# Patient Record
Sex: Female | Born: 1975 | Race: White | Hispanic: No | Marital: Married | Smoking: Never smoker
Health system: Southern US, Community
[De-identification: ages and names within clinical notes are randomized; demographics above are authoritative.]

## PROBLEM LIST (undated history)

## (undated) DIAGNOSIS — Z923 Personal history of irradiation: Secondary | ICD-10-CM

## (undated) DIAGNOSIS — D693 Immune thrombocytopenic purpura: Secondary | ICD-10-CM

## (undated) DIAGNOSIS — D72819 Decreased white blood cell count, unspecified: Secondary | ICD-10-CM

## (undated) DIAGNOSIS — K118 Other diseases of salivary glands: Secondary | ICD-10-CM

## (undated) DIAGNOSIS — D72821 Monocytosis (symptomatic): Secondary | ICD-10-CM

## (undated) DIAGNOSIS — M8700 Idiopathic aseptic necrosis of unspecified bone: Secondary | ICD-10-CM

## (undated) HISTORY — DX: Monocytosis (symptomatic): D72.821

## (undated) HISTORY — DX: Other diseases of salivary glands: K11.8

## (undated) HISTORY — DX: Idiopathic aseptic necrosis of unspecified bone: M87.00

## (undated) HISTORY — PX: TUBAL LIGATION: SHX77

## (undated) HISTORY — PX: OTHER SURGICAL HISTORY: SHX169

## (undated) HISTORY — DX: Immune thrombocytopenic purpura: D69.3

## (undated) HISTORY — DX: Decreased white blood cell count, unspecified: D72.819

---
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
## 1999-05-22 ENCOUNTER — Other Ambulatory Visit: Admission: RE | Admit: 1999-05-22 | Discharge: 1999-05-22 | Payer: Self-pay | Admitting: Obstetrics and Gynecology

## 2000-05-02 ENCOUNTER — Other Ambulatory Visit: Admission: RE | Admit: 2000-05-02 | Discharge: 2000-05-02 | Payer: Self-pay | Admitting: Obstetrics and Gynecology

## 2001-05-11 ENCOUNTER — Other Ambulatory Visit: Admission: RE | Admit: 2001-05-11 | Discharge: 2001-05-11 | Payer: Self-pay | Admitting: Obstetrics and Gynecology

## 2002-05-14 ENCOUNTER — Other Ambulatory Visit: Admission: RE | Admit: 2002-05-14 | Discharge: 2002-05-14 | Payer: Self-pay | Admitting: Obstetrics and Gynecology

## 2003-05-20 ENCOUNTER — Other Ambulatory Visit: Admission: RE | Admit: 2003-05-20 | Discharge: 2003-05-20 | Payer: Self-pay | Admitting: Obstetrics and Gynecology

## 2004-05-19 ENCOUNTER — Other Ambulatory Visit: Admission: RE | Admit: 2004-05-19 | Discharge: 2004-05-19 | Payer: Self-pay | Admitting: Obstetrics and Gynecology

## 2005-06-07 ENCOUNTER — Other Ambulatory Visit: Admission: RE | Admit: 2005-06-07 | Discharge: 2005-06-07 | Payer: Self-pay | Admitting: Obstetrics and Gynecology

## 2005-06-21 ENCOUNTER — Encounter: Admission: RE | Admit: 2005-06-21 | Discharge: 2005-09-19 | Payer: Self-pay | Admitting: Obstetrics and Gynecology

## 2006-01-10 ENCOUNTER — Encounter (INDEPENDENT_AMBULATORY_CARE_PROVIDER_SITE_OTHER): Payer: Self-pay | Admitting: *Deleted

## 2006-01-10 ENCOUNTER — Ambulatory Visit (HOSPITAL_COMMUNITY): Admission: RE | Admit: 2006-01-10 | Discharge: 2006-01-10 | Payer: Self-pay | Admitting: Obstetrics and Gynecology

## 2006-04-29 ENCOUNTER — Inpatient Hospital Stay (HOSPITAL_COMMUNITY): Admission: AD | Admit: 2006-04-29 | Discharge: 2006-04-29 | Payer: Self-pay | Admitting: Obstetrics & Gynecology

## 2006-05-02 ENCOUNTER — Ambulatory Visit (HOSPITAL_COMMUNITY): Admission: RE | Admit: 2006-05-02 | Discharge: 2006-05-02 | Payer: Self-pay | Admitting: Obstetrics and Gynecology

## 2006-05-04 ENCOUNTER — Inpatient Hospital Stay (HOSPITAL_COMMUNITY): Admission: AD | Admit: 2006-05-04 | Discharge: 2006-05-04 | Payer: Self-pay | Admitting: Obstetrics and Gynecology

## 2006-05-04 ENCOUNTER — Ambulatory Visit: Payer: Self-pay | Admitting: Oncology

## 2006-05-05 ENCOUNTER — Inpatient Hospital Stay (HOSPITAL_COMMUNITY): Admission: AD | Admit: 2006-05-05 | Discharge: 2006-05-05 | Payer: Self-pay | Admitting: Obstetrics and Gynecology

## 2006-05-05 ENCOUNTER — Ambulatory Visit: Payer: Self-pay | Admitting: Oncology

## 2006-05-06 ENCOUNTER — Inpatient Hospital Stay (HOSPITAL_COMMUNITY): Admission: AD | Admit: 2006-05-06 | Discharge: 2006-05-06 | Payer: Self-pay | Admitting: Obstetrics and Gynecology

## 2006-05-09 ENCOUNTER — Inpatient Hospital Stay (HOSPITAL_COMMUNITY): Admission: AD | Admit: 2006-05-09 | Discharge: 2006-05-09 | Payer: Self-pay | Admitting: Obstetrics and Gynecology

## 2006-05-13 LAB — CBC & DIFF AND RETIC
Basophils Absolute: 0 10*3/uL (ref 0.0–0.1)
Eosinophils Absolute: 0 10*3/uL (ref 0.0–0.5)
HGB: 13.3 g/dL (ref 11.6–15.9)
IRF: 0.39 — ABNORMAL HIGH (ref 0.130–0.330)
MCV: 88.3 fL (ref 81.0–101.0)
NEUT#: 4.9 10*3/uL (ref 1.5–6.5)
RDW: 13.8 % (ref 11.3–14.5)
RETIC #: 97.9 10*3/uL (ref 19.7–115.1)
lymph#: 0.9 10*3/uL (ref 0.9–3.3)

## 2006-05-13 LAB — MORPHOLOGY: PLT EST: ADEQUATE

## 2006-05-16 LAB — COMPREHENSIVE METABOLIC PANEL
ALT: 15 U/L (ref 0–35)
AST: 14 U/L (ref 0–37)
Albumin: 4 g/dL (ref 3.5–5.2)
Calcium: 10.2 mg/dL (ref 8.4–10.5)
Chloride: 104 mEq/L (ref 96–112)
Potassium: 3.7 mEq/L (ref 3.5–5.3)

## 2006-05-16 LAB — HEPATITIS A ANTIBODY, IGM: Hep A IgM: NEGATIVE

## 2006-05-16 LAB — HIV ANTIBODY (ROUTINE TESTING W REFLEX)

## 2006-05-16 LAB — HEPATITIS B SURFACE ANTIGEN: Hepatitis B Surface Ag: NEGATIVE

## 2006-05-27 ENCOUNTER — Observation Stay (HOSPITAL_COMMUNITY): Admission: AD | Admit: 2006-05-27 | Discharge: 2006-05-27 | Payer: Self-pay | Admitting: Oncology

## 2006-05-30 LAB — CBC WITH DIFFERENTIAL/PLATELET
Basophils Absolute: 0.1 10*3/uL (ref 0.0–0.1)
EOS%: 2.2 % (ref 0.0–7.0)
HCT: 37.5 % (ref 34.8–46.6)
HGB: 12.7 g/dL (ref 11.6–15.9)
LYMPH%: 11.8 % — ABNORMAL LOW (ref 14.0–48.0)
MCH: 31.2 pg (ref 26.0–34.0)
NEUT%: 77 % — ABNORMAL HIGH (ref 39.6–76.8)
Platelets: 114 10*3/uL — ABNORMAL LOW (ref 145–400)
lymph#: 0.8 10*3/uL — ABNORMAL LOW (ref 0.9–3.3)

## 2006-06-03 ENCOUNTER — Ambulatory Visit (HOSPITAL_COMMUNITY): Admission: RE | Admit: 2006-06-03 | Discharge: 2006-06-03 | Payer: Self-pay | Admitting: Obstetrics and Gynecology

## 2006-06-03 LAB — CBC WITH DIFFERENTIAL/PLATELET
Basophils Absolute: 0 10*3/uL (ref 0.0–0.1)
EOS%: 3.6 % (ref 0.0–7.0)
HCT: 33.9 % — ABNORMAL LOW (ref 34.8–46.6)
HGB: 12 g/dL (ref 11.6–15.9)
MCH: 31.5 pg (ref 26.0–34.0)
MCV: 89.2 fL (ref 81.0–101.0)
MONO%: 11 % (ref 0.0–13.0)
NEUT%: 67.1 % (ref 39.6–76.8)

## 2006-06-08 LAB — CBC WITH DIFFERENTIAL/PLATELET
EOS%: 1.4 % (ref 0.0–7.0)
MCH: 32.7 pg (ref 26.0–34.0)
MCHC: 36.3 g/dL — ABNORMAL HIGH (ref 32.0–36.0)
MCV: 90 fL (ref 81.0–101.0)
MONO%: 10.1 % (ref 0.0–13.0)
RBC: 3.66 10*6/uL — ABNORMAL LOW (ref 3.70–5.32)
RDW: 14.3 % (ref 11.3–14.5)

## 2006-06-13 LAB — CBC WITH DIFFERENTIAL/PLATELET
Eosinophils Absolute: 0.2 10*3/uL (ref 0.0–0.5)
MCV: 90.9 fL (ref 81.0–101.0)
MONO#: 0.8 10*3/uL (ref 0.1–0.9)
MONO%: 9.5 % (ref 0.0–13.0)
NEUT#: 5.8 10*3/uL (ref 1.5–6.5)
RBC: 3.71 10*6/uL (ref 3.70–5.32)
RDW: 14.4 % (ref 11.3–14.5)
WBC: 8.3 10*3/uL (ref 3.9–10.0)

## 2006-06-16 ENCOUNTER — Ambulatory Visit: Payer: Self-pay | Admitting: Oncology

## 2006-06-17 LAB — CBC WITH DIFFERENTIAL/PLATELET
Eosinophils Absolute: 0 10*3/uL (ref 0.0–0.5)
LYMPH%: 6.2 % — ABNORMAL LOW (ref 14.0–48.0)
MONO#: 1 10*3/uL — ABNORMAL HIGH (ref 0.1–0.9)
NEUT#: 12.8 10*3/uL — ABNORMAL HIGH (ref 1.5–6.5)
Platelets: 172 10*3/uL (ref 145–400)
RBC: 3.92 10*6/uL (ref 3.70–5.32)
WBC: 14.7 10*3/uL — ABNORMAL HIGH (ref 3.9–10.0)

## 2006-06-27 ENCOUNTER — Ambulatory Visit (HOSPITAL_COMMUNITY): Admission: RE | Admit: 2006-06-27 | Discharge: 2006-06-27 | Payer: Self-pay | Admitting: Obstetrics and Gynecology

## 2006-06-27 LAB — CBC WITH DIFFERENTIAL/PLATELET
Basophils Absolute: 0 10*3/uL (ref 0.0–0.1)
Eosinophils Absolute: 0.1 10*3/uL (ref 0.0–0.5)
HCT: 36.2 % (ref 34.8–46.6)
HGB: 12.6 g/dL (ref 11.6–15.9)
LYMPH%: 15.4 % (ref 14.0–48.0)
MCHC: 34.9 g/dL (ref 32.0–36.0)
MONO#: 0.9 10*3/uL (ref 0.1–0.9)
NEUT#: 6 10*3/uL (ref 1.5–6.5)
NEUT%: 72.5 % (ref 39.6–76.8)
Platelets: 87 10*3/uL — ABNORMAL LOW (ref 145–400)
WBC: 8.3 10*3/uL (ref 3.9–10.0)

## 2006-06-30 LAB — CBC WITH DIFFERENTIAL/PLATELET
Eosinophils Absolute: 0.1 10*3/uL (ref 0.0–0.5)
MONO#: 0.7 10*3/uL (ref 0.1–0.9)
NEUT#: 6.9 10*3/uL — ABNORMAL HIGH (ref 1.5–6.5)
RBC: 3.99 10*6/uL (ref 3.70–5.32)
RDW: 12.4 % (ref 11.3–14.5)
WBC: 8.8 10*3/uL (ref 3.9–10.0)
lymph#: 1.1 10*3/uL (ref 0.9–3.3)

## 2006-07-04 LAB — CBC WITH DIFFERENTIAL/PLATELET
Eosinophils Absolute: 0.1 10*3/uL (ref 0.0–0.5)
HCT: 37.4 % (ref 34.8–46.6)
LYMPH%: 13.5 % — ABNORMAL LOW (ref 14.0–48.0)
MONO#: 0.7 10*3/uL (ref 0.1–0.9)
NEUT#: 5.4 10*3/uL (ref 1.5–6.5)
NEUT%: 74.5 % (ref 39.6–76.8)
Platelets: 118 10*3/uL — ABNORMAL LOW (ref 145–400)
WBC: 7.2 10*3/uL (ref 3.9–10.0)

## 2006-07-07 LAB — CBC WITH DIFFERENTIAL/PLATELET
BASO%: 0.6 % (ref 0.0–2.0)
Basophils Absolute: 0 10*3/uL (ref 0.0–0.1)
HCT: 37.8 % (ref 34.8–46.6)
LYMPH%: 21.1 % (ref 14.0–48.0)
MCHC: 34.6 g/dL (ref 32.0–36.0)
MONO#: 0.5 10*3/uL (ref 0.1–0.9)
NEUT%: 65.8 % (ref 39.6–76.8)
Platelets: 141 10*3/uL — ABNORMAL LOW (ref 145–400)
WBC: 5.9 10*3/uL (ref 3.9–10.0)

## 2006-07-11 LAB — CBC WITH DIFFERENTIAL/PLATELET
BASO%: 0.9 % (ref 0.0–2.0)
EOS%: 3 % (ref 0.0–7.0)
HCT: 37.1 % (ref 34.8–46.6)
LYMPH%: 19.5 % (ref 14.0–48.0)
MCH: 32.9 pg (ref 26.0–34.0)
MCHC: 35.7 g/dL (ref 32.0–36.0)
MCV: 92.1 fL (ref 81.0–101.0)
MONO%: 9.2 % (ref 0.0–13.0)
NEUT%: 67.4 % (ref 39.6–76.8)
Platelets: 111 10*3/uL — ABNORMAL LOW (ref 145–400)
lymph#: 1.3 10*3/uL (ref 0.9–3.3)

## 2006-07-15 LAB — CBC WITH DIFFERENTIAL/PLATELET
BASO%: 1 % (ref 0.0–2.0)
EOS%: 0.8 % (ref 0.0–7.0)
MCH: 33.6 pg (ref 26.0–34.0)
MCHC: 36.2 g/dL — ABNORMAL HIGH (ref 32.0–36.0)
MCV: 92.7 fL (ref 81.0–101.0)
MONO%: 10.4 % (ref 0.0–13.0)
RBC: 3.98 10*6/uL (ref 3.70–5.32)
RDW: 11.7 % (ref 11.3–14.5)
lymph#: 0.5 10*3/uL — ABNORMAL LOW (ref 0.9–3.3)

## 2006-07-18 LAB — CBC WITH DIFFERENTIAL/PLATELET
Basophils Absolute: 0 10*3/uL (ref 0.0–0.1)
Eosinophils Absolute: 0 10*3/uL (ref 0.0–0.5)
HGB: 13.2 g/dL (ref 11.6–15.9)
MCV: 92.6 fL (ref 81.0–101.0)
MONO#: 0.5 10*3/uL (ref 0.1–0.9)
NEUT#: 11.8 10*3/uL — ABNORMAL HIGH (ref 1.5–6.5)
RBC: 4.01 10*6/uL (ref 3.70–5.32)
RDW: 11.6 % (ref 11.3–14.5)
WBC: 13.4 10*3/uL — ABNORMAL HIGH (ref 3.9–10.0)
lymph#: 1.1 10*3/uL (ref 0.9–3.3)

## 2006-07-25 LAB — CBC WITH DIFFERENTIAL/PLATELET
Basophils Absolute: 0.1 10*3/uL (ref 0.0–0.1)
Eosinophils Absolute: 0.1 10*3/uL (ref 0.0–0.5)
HGB: 13.3 g/dL (ref 11.6–15.9)
LYMPH%: 14.6 % (ref 14.0–48.0)
MCV: 92.1 fL (ref 81.0–101.0)
MONO#: 1.3 10*3/uL — ABNORMAL HIGH (ref 0.1–0.9)
MONO%: 12.6 % (ref 0.0–13.0)
NEUT#: 7.5 10*3/uL — ABNORMAL HIGH (ref 1.5–6.5)
Platelets: 127 10*3/uL — ABNORMAL LOW (ref 145–400)
WBC: 10.5 10*3/uL — ABNORMAL HIGH (ref 3.9–10.0)

## 2006-07-28 ENCOUNTER — Ambulatory Visit: Payer: Self-pay | Admitting: Oncology

## 2006-08-01 LAB — CBC WITH DIFFERENTIAL/PLATELET
Eosinophils Absolute: 0.1 10*3/uL (ref 0.0–0.5)
HCT: 39.5 % (ref 34.8–46.6)
LYMPH%: 15.5 % (ref 14.0–48.0)
MCHC: 35.1 g/dL (ref 32.0–36.0)
MCV: 93.2 fL (ref 81.0–101.0)
MONO#: 0.8 10*3/uL (ref 0.1–0.9)
MONO%: 9.7 % (ref 0.0–13.0)
NEUT#: 6.4 10*3/uL (ref 1.5–6.5)
NEUT%: 73.4 % (ref 39.6–76.8)
Platelets: 30 10*3/uL — ABNORMAL LOW (ref 145–400)
RBC: 4.24 10*6/uL (ref 3.70–5.32)
WBC: 8.7 10*3/uL (ref 3.9–10.0)

## 2006-08-05 LAB — CBC WITH DIFFERENTIAL/PLATELET
BASO%: 0.7 % (ref 0.0–2.0)
EOS%: 0.6 % (ref 0.0–7.0)
HCT: 35.7 % (ref 34.8–46.6)
LYMPH%: 22.2 % (ref 14.0–48.0)
MCH: 32.4 pg (ref 26.0–34.0)
MCHC: 35.4 g/dL (ref 32.0–36.0)
MONO%: 12.5 % (ref 0.0–13.0)
NEUT%: 64 % (ref 39.6–76.8)
Platelets: 152 10*3/uL (ref 145–400)
RBC: 3.9 10*6/uL (ref 3.70–5.32)
WBC: 9.8 10*3/uL (ref 3.9–10.0)

## 2006-08-08 LAB — CBC WITH DIFFERENTIAL/PLATELET
BASO%: 0.5 % (ref 0.0–2.0)
EOS%: 2.1 % (ref 0.0–7.0)
MCH: 31.8 pg (ref 26.0–34.0)
MCHC: 35.3 g/dL (ref 32.0–36.0)
MONO#: 1.1 10*3/uL — ABNORMAL HIGH (ref 0.1–0.9)
NEUT%: 70.6 % (ref 39.6–76.8)
RBC: 4.27 10*6/uL (ref 3.70–5.32)
RDW: 10.7 % — ABNORMAL LOW (ref 11.3–14.5)
WBC: 10.1 10*3/uL — ABNORMAL HIGH (ref 3.9–10.0)
lymph#: 1.6 10*3/uL (ref 0.9–3.3)

## 2006-08-10 ENCOUNTER — Ambulatory Visit (HOSPITAL_COMMUNITY): Admission: RE | Admit: 2006-08-10 | Discharge: 2006-08-10 | Payer: Self-pay | Admitting: Obstetrics and Gynecology

## 2006-08-12 LAB — CBC WITH DIFFERENTIAL/PLATELET
BASO%: 0.6 % (ref 0.0–2.0)
Basophils Absolute: 0 10*3/uL (ref 0.0–0.1)
EOS%: 1.8 % (ref 0.0–7.0)
HGB: 13.6 g/dL (ref 11.6–15.9)
MCH: 32.1 pg (ref 26.0–34.0)
MONO#: 0.8 10*3/uL (ref 0.1–0.9)
RDW: 11 % — ABNORMAL LOW (ref 11.3–14.5)
WBC: 8.1 10*3/uL (ref 3.9–10.0)
lymph#: 1.4 10*3/uL (ref 0.9–3.3)

## 2006-08-15 LAB — CBC WITH DIFFERENTIAL/PLATELET
BASO%: 0.3 % (ref 0.0–2.0)
EOS%: 0 % (ref 0.0–7.0)
Eosinophils Absolute: 0 10*3/uL (ref 0.0–0.5)
MCH: 32.2 pg (ref 26.0–34.0)
MCHC: 35.1 g/dL (ref 32.0–36.0)
MCV: 91.6 fL (ref 81.0–101.0)
MONO%: 2.2 % (ref 0.0–13.0)
NEUT#: 13.6 10*3/uL — ABNORMAL HIGH (ref 1.5–6.5)
RBC: 3.99 10*6/uL (ref 3.70–5.32)
RDW: 11.3 % (ref 11.3–14.5)

## 2006-08-18 LAB — CBC WITH DIFFERENTIAL/PLATELET
BASO%: 0.3 % (ref 0.0–2.0)
Eosinophils Absolute: 0 10*3/uL (ref 0.0–0.5)
MCHC: 34.9 g/dL (ref 32.0–36.0)
MONO#: 0.7 10*3/uL (ref 0.1–0.9)
NEUT#: 9.1 10*3/uL — ABNORMAL HIGH (ref 1.5–6.5)
RBC: 4.1 10*6/uL (ref 3.70–5.32)
RDW: 11 % — ABNORMAL LOW (ref 11.3–14.5)
WBC: 11.3 10*3/uL — ABNORMAL HIGH (ref 3.9–10.0)
lymph#: 1.5 10*3/uL (ref 0.9–3.3)

## 2006-08-25 LAB — CBC WITH DIFFERENTIAL/PLATELET
BASO%: 0.2 % (ref 0.0–2.0)
Eosinophils Absolute: 0 10*3/uL (ref 0.0–0.5)
HCT: 37.2 % (ref 34.8–46.6)
LYMPH%: 10.3 % — ABNORMAL LOW (ref 14.0–48.0)
MONO#: 0.6 10*3/uL (ref 0.1–0.9)
NEUT#: 11.8 10*3/uL — ABNORMAL HIGH (ref 1.5–6.5)
NEUT%: 84.8 % — ABNORMAL HIGH (ref 39.6–76.8)
Platelets: 124 10*3/uL — ABNORMAL LOW (ref 145–400)
WBC: 13.9 10*3/uL — ABNORMAL HIGH (ref 3.9–10.0)
lymph#: 1.4 10*3/uL (ref 0.9–3.3)

## 2006-08-29 LAB — CBC WITH DIFFERENTIAL/PLATELET
BASO%: 0.4 % (ref 0.0–2.0)
Basophils Absolute: 0 10*3/uL (ref 0.0–0.1)
HCT: 37 % (ref 34.8–46.6)
HGB: 12.7 g/dL (ref 11.6–15.9)
LYMPH%: 8.9 % — ABNORMAL LOW (ref 14.0–48.0)
MCH: 31.2 pg (ref 26.0–34.0)
MCHC: 34.3 g/dL (ref 32.0–36.0)
MONO#: 0.5 10*3/uL (ref 0.1–0.9)
NEUT%: 86.5 % — ABNORMAL HIGH (ref 39.6–76.8)
Platelets: 126 10*3/uL — ABNORMAL LOW (ref 145–400)
WBC: 11.6 10*3/uL — ABNORMAL HIGH (ref 3.9–10.0)
lymph#: 1 10*3/uL (ref 0.9–3.3)

## 2006-09-01 LAB — CBC WITH DIFFERENTIAL/PLATELET
BASO%: 0.4 % (ref 0.0–2.0)
Basophils Absolute: 0 10*3/uL (ref 0.0–0.1)
EOS%: 1.4 % (ref 0.0–7.0)
HCT: 36.9 % (ref 34.8–46.6)
HGB: 12.5 g/dL (ref 11.6–15.9)
LYMPH%: 18.4 % (ref 14.0–48.0)
MCH: 30.9 pg (ref 26.0–34.0)
MCHC: 33.9 g/dL (ref 32.0–36.0)
MCV: 91 fL (ref 81.0–101.0)
MONO%: 7.5 % (ref 0.0–13.0)
NEUT%: 72.3 % (ref 39.6–76.8)
Platelets: 104 10*3/uL — ABNORMAL LOW (ref 145–400)

## 2006-09-05 ENCOUNTER — Ambulatory Visit: Payer: Self-pay | Admitting: Oncology

## 2006-09-06 LAB — CBC WITH DIFFERENTIAL/PLATELET
BASO%: 0.3 % (ref 0.0–2.0)
EOS%: 0.1 % (ref 0.0–7.0)
Eosinophils Absolute: 0 10*3/uL (ref 0.0–0.5)
MCV: 90.8 fL (ref 81.0–101.0)
MONO%: 4.4 % (ref 0.0–13.0)
NEUT#: 13.6 10*3/uL — ABNORMAL HIGH (ref 1.5–6.5)
RBC: 3.95 10*6/uL (ref 3.70–5.32)
RDW: 13.2 % (ref 11.3–14.5)

## 2006-09-12 LAB — CBC WITH DIFFERENTIAL/PLATELET
Eosinophils Absolute: 0.1 10*3/uL (ref 0.0–0.5)
MONO#: 0.9 10*3/uL (ref 0.1–0.9)
NEUT#: 9.8 10*3/uL — ABNORMAL HIGH (ref 1.5–6.5)
Platelets: 153 10*3/uL (ref 145–400)
RBC: 4.13 10*6/uL (ref 3.70–5.32)
RDW: 12.3 % (ref 11.3–14.5)
WBC: 13.1 10*3/uL — ABNORMAL HIGH (ref 3.9–10.0)
lymph#: 2.2 10*3/uL (ref 0.9–3.3)

## 2006-09-19 LAB — CBC WITH DIFFERENTIAL/PLATELET
Eosinophils Absolute: 0 10*3/uL (ref 0.0–0.5)
HCT: 37.1 % (ref 34.8–46.6)
HGB: 12.7 g/dL (ref 11.6–15.9)
LYMPH%: 13.4 % — ABNORMAL LOW (ref 14.0–48.0)
MONO#: 0.7 10*3/uL (ref 0.1–0.9)
NEUT#: 10.3 10*3/uL — ABNORMAL HIGH (ref 1.5–6.5)
NEUT%: 80.4 % — ABNORMAL HIGH (ref 39.6–76.8)
Platelets: 142 10*3/uL — ABNORMAL LOW (ref 145–400)
WBC: 12.8 10*3/uL — ABNORMAL HIGH (ref 3.9–10.0)
lymph#: 1.7 10*3/uL (ref 0.9–3.3)

## 2006-09-22 ENCOUNTER — Ambulatory Visit (HOSPITAL_COMMUNITY): Admission: RE | Admit: 2006-09-22 | Discharge: 2006-09-22 | Payer: Self-pay | Admitting: Obstetrics and Gynecology

## 2006-09-26 LAB — CBC WITH DIFFERENTIAL/PLATELET
BASO%: 0.6 % (ref 0.0–2.0)
Basophils Absolute: 0.1 10*3/uL (ref 0.0–0.1)
HCT: 38.6 % (ref 34.8–46.6)
HGB: 13.5 g/dL (ref 11.6–15.9)
LYMPH%: 17 % (ref 14.0–48.0)
MCH: 31.7 pg (ref 26.0–34.0)
MCHC: 35 g/dL (ref 32.0–36.0)
MONO#: 1 10*3/uL — ABNORMAL HIGH (ref 0.1–0.9)
NEUT%: 73.5 % (ref 39.6–76.8)
Platelets: 184 10*3/uL (ref 145–400)

## 2006-09-28 ENCOUNTER — Encounter: Payer: Self-pay | Admitting: Obstetrics and Gynecology

## 2006-09-28 ENCOUNTER — Inpatient Hospital Stay (HOSPITAL_COMMUNITY): Admission: AD | Admit: 2006-09-28 | Discharge: 2006-10-01 | Payer: Self-pay | Admitting: Obstetrics and Gynecology

## 2006-09-28 ENCOUNTER — Encounter (INDEPENDENT_AMBULATORY_CARE_PROVIDER_SITE_OTHER): Payer: Self-pay | Admitting: Specialist

## 2006-09-29 ENCOUNTER — Ambulatory Visit: Payer: Self-pay | Admitting: Oncology

## 2006-10-03 LAB — CBC WITH DIFFERENTIAL/PLATELET
BASO%: 0.5 % (ref 0.0–2.0)
Basophils Absolute: 0.1 10*3/uL (ref 0.0–0.1)
EOS%: 0.6 % (ref 0.0–7.0)
HCT: 38.8 % (ref 34.8–46.6)
HGB: 13 g/dL (ref 11.6–15.9)
LYMPH%: 7.2 % — ABNORMAL LOW (ref 14.0–48.0)
MCH: 31 pg (ref 26.0–34.0)
MCHC: 33.4 g/dL (ref 32.0–36.0)
MCV: 92.9 fL (ref 81.0–101.0)
MONO%: 4 % (ref 0.0–13.0)
NEUT%: 87.8 % — ABNORMAL HIGH (ref 39.6–76.8)
lymph#: 1.1 10*3/uL (ref 0.9–3.3)

## 2006-10-04 ENCOUNTER — Encounter: Admission: RE | Admit: 2006-10-04 | Discharge: 2006-10-22 | Payer: Self-pay | Admitting: Obstetrics and Gynecology

## 2006-10-10 LAB — CBC WITH DIFFERENTIAL/PLATELET
BASO%: 0.3 % (ref 0.0–2.0)
Basophils Absolute: 0 10*3/uL (ref 0.0–0.1)
EOS%: 0.3 % (ref 0.0–7.0)
HGB: 14.2 g/dL (ref 11.6–15.9)
MCH: 31.3 pg (ref 26.0–34.0)
MCHC: 34 g/dL (ref 32.0–36.0)
MCV: 92.2 fL (ref 81.0–101.0)
MONO%: 7.9 % (ref 0.0–13.0)
RBC: 4.53 10*6/uL (ref 3.70–5.32)
RDW: 13.5 % (ref 11.3–14.5)
lymph#: 1.9 10*3/uL (ref 0.9–3.3)

## 2006-10-17 ENCOUNTER — Ambulatory Visit: Payer: Self-pay | Admitting: Oncology

## 2006-10-17 LAB — CBC WITH DIFFERENTIAL/PLATELET
Basophils Absolute: 0.1 10*3/uL (ref 0.0–0.1)
HCT: 40.2 % (ref 34.8–46.6)
HGB: 13.7 g/dL (ref 11.6–15.9)
LYMPH%: 16 % (ref 14.0–48.0)
MONO#: 0.8 10*3/uL (ref 0.1–0.9)
NEUT%: 75.7 % (ref 39.6–76.8)
Platelets: 183 10*3/uL (ref 145–400)
WBC: 11.2 10*3/uL — ABNORMAL HIGH (ref 3.9–10.0)
lymph#: 1.8 10*3/uL (ref 0.9–3.3)

## 2006-10-25 LAB — CBC WITH DIFFERENTIAL/PLATELET
Basophils Absolute: 0.1 10*3/uL (ref 0.0–0.1)
EOS%: 1.5 % (ref 0.0–7.0)
HCT: 41.8 % (ref 34.8–46.6)
HGB: 14.2 g/dL (ref 11.6–15.9)
LYMPH%: 27.3 % (ref 14.0–48.0)
MCH: 31.3 pg (ref 26.0–34.0)
MCV: 92.5 fL (ref 81.0–101.0)
MONO%: 7.9 % (ref 0.0–13.0)
NEUT%: 62.5 % (ref 39.6–76.8)
Platelets: 127 10*3/uL — ABNORMAL LOW (ref 145–400)

## 2006-10-31 LAB — CBC WITH DIFFERENTIAL/PLATELET
Basophils Absolute: 0 10*3/uL (ref 0.0–0.1)
EOS%: 0.1 % (ref 0.0–7.0)
HGB: 14.8 g/dL (ref 11.6–15.9)
MCH: 31.9 pg (ref 26.0–34.0)
MCHC: 34.8 g/dL (ref 32.0–36.0)
MCV: 91.7 fL (ref 81.0–101.0)
MONO%: 6.4 % (ref 0.0–13.0)
RBC: 4.64 10*6/uL (ref 3.70–5.32)
RDW: 13.3 % (ref 11.3–14.5)

## 2006-11-07 LAB — CBC WITH DIFFERENTIAL/PLATELET
EOS%: 0.9 % (ref 0.0–7.0)
Eosinophils Absolute: 0.1 10*3/uL (ref 0.0–0.5)
MCV: 92.2 fL (ref 81.0–101.0)
MONO%: 9.9 % (ref 0.0–13.0)
NEUT#: 7.3 10*3/uL — ABNORMAL HIGH (ref 1.5–6.5)
RBC: 4.68 10*6/uL (ref 3.70–5.32)
RDW: 13.1 % (ref 11.3–14.5)

## 2006-11-14 LAB — CBC WITH DIFFERENTIAL/PLATELET
BASO%: 0.6 % (ref 0.0–2.0)
Basophils Absolute: 0.1 10*3/uL (ref 0.0–0.1)
EOS%: 1.1 % (ref 0.0–7.0)
LYMPH%: 22 % (ref 14.0–48.0)
MCH: 32.1 pg (ref 26.0–34.0)
MCHC: 34.6 g/dL (ref 32.0–36.0)
MONO%: 8.1 % (ref 0.0–13.0)
NEUT#: 6.4 10*3/uL (ref 1.5–6.5)
NEUT%: 68.2 % (ref 39.6–76.8)
Platelets: 176 10*3/uL (ref 145–400)
WBC: 9.4 10*3/uL (ref 3.9–10.0)

## 2006-11-21 LAB — CBC WITH DIFFERENTIAL/PLATELET
BASO%: 0.9 % (ref 0.0–2.0)
Basophils Absolute: 0.1 10*3/uL (ref 0.0–0.1)
EOS%: 1.3 % (ref 0.0–7.0)
HCT: 40.9 % (ref 34.8–46.6)
HGB: 14.2 g/dL (ref 11.6–15.9)
MCH: 31.2 pg (ref 26.0–34.0)
MCHC: 34.8 g/dL (ref 32.0–36.0)
MCV: 89.6 fL (ref 81.0–101.0)
MONO%: 12.6 % (ref 0.0–13.0)
NEUT%: 58.5 % (ref 39.6–76.8)
RDW: 12.6 % (ref 11.3–14.5)
lymph#: 2 10*3/uL (ref 0.9–3.3)

## 2006-11-28 ENCOUNTER — Ambulatory Visit: Payer: Self-pay | Admitting: Oncology

## 2006-11-28 LAB — CBC WITH DIFFERENTIAL/PLATELET
BASO%: 0.9 % (ref 0.0–2.0)
Basophils Absolute: 0.1 10*3/uL (ref 0.0–0.1)
EOS%: 2.4 % (ref 0.0–7.0)
HGB: 14.1 g/dL (ref 11.6–15.9)
MCH: 30.8 pg (ref 26.0–34.0)
MCV: 88.9 fL (ref 81.0–101.0)
MONO%: 8 % (ref 0.0–13.0)
RBC: 4.59 10*6/uL (ref 3.70–5.32)
RDW: 12.2 % (ref 11.3–14.5)
lymph#: 1.9 10*3/uL (ref 0.9–3.3)

## 2006-12-05 LAB — CBC WITH DIFFERENTIAL/PLATELET
Basophils Absolute: 0.1 10*3/uL (ref 0.0–0.1)
Eosinophils Absolute: 0.1 10*3/uL (ref 0.0–0.5)
HGB: 14.9 g/dL (ref 11.6–15.9)
MONO#: 0.8 10*3/uL (ref 0.1–0.9)
MONO%: 12.9 % (ref 0.0–13.0)
NEUT#: 3.3 10*3/uL (ref 1.5–6.5)
RBC: 4.75 10*6/uL (ref 3.70–5.32)
RDW: 11.8 % (ref 11.3–14.5)
WBC: 6.3 10*3/uL (ref 3.9–10.0)
lymph#: 2 10*3/uL (ref 0.9–3.3)

## 2006-12-19 LAB — CBC WITH DIFFERENTIAL/PLATELET
Basophils Absolute: 0.1 10*3/uL (ref 0.0–0.1)
Eosinophils Absolute: 0.1 10*3/uL (ref 0.0–0.5)
HGB: 14 g/dL (ref 11.6–15.9)
LYMPH%: 26 % (ref 14.0–48.0)
MCV: 90.3 fL (ref 81.0–101.0)
MONO#: 0.8 10*3/uL (ref 0.1–0.9)
NEUT#: 4 10*3/uL (ref 1.5–6.5)
Platelets: 236 10*3/uL (ref 145–400)
RBC: 4.52 10*6/uL (ref 3.70–5.32)
WBC: 6.7 10*3/uL (ref 3.9–10.0)

## 2007-01-02 LAB — CBC WITH DIFFERENTIAL/PLATELET
Eosinophils Absolute: 0.1 10*3/uL (ref 0.0–0.5)
HCT: 43.2 % (ref 34.8–46.6)
LYMPH%: 27.4 % (ref 14.0–48.0)
MCHC: 34.3 g/dL (ref 32.0–36.0)
MCV: 89.9 fL (ref 81.0–101.0)
MONO#: 0.6 10*3/uL (ref 0.1–0.9)
MONO%: 16.6 % — ABNORMAL HIGH (ref 0.0–13.0)
NEUT#: 2.1 10*3/uL (ref 1.5–6.5)
NEUT%: 52.9 % (ref 39.6–76.8)
Platelets: 219 10*3/uL (ref 145–400)
WBC: 3.9 10*3/uL (ref 3.9–10.0)

## 2007-02-06 ENCOUNTER — Ambulatory Visit: Payer: Self-pay | Admitting: Oncology

## 2007-02-06 LAB — CBC WITH DIFFERENTIAL/PLATELET
BASO%: 1.8 % (ref 0.0–2.0)
EOS%: 2.3 % (ref 0.0–7.0)
HCT: 44.5 % (ref 34.8–46.6)
LYMPH%: 34 % (ref 14.0–48.0)
MCH: 29.6 pg (ref 26.0–34.0)
MCHC: 33.7 g/dL (ref 32.0–36.0)
MONO%: 16.3 % — ABNORMAL HIGH (ref 0.0–13.0)
NEUT%: 45.7 % (ref 39.6–76.8)
Platelets: 234 10*3/uL (ref 145–400)

## 2007-05-04 ENCOUNTER — Ambulatory Visit: Payer: Self-pay | Admitting: Oncology

## 2007-05-08 LAB — CBC WITH DIFFERENTIAL/PLATELET
Basophils Absolute: 0 10*3/uL (ref 0.0–0.1)
EOS%: 1.5 % (ref 0.0–7.0)
HCT: 40.1 % (ref 34.8–46.6)
HGB: 14.2 g/dL (ref 11.6–15.9)
LYMPH%: 30.6 % (ref 14.0–48.0)
MCH: 30.3 pg (ref 26.0–34.0)
MCV: 85.4 fL (ref 81.0–101.0)
MONO%: 15 % — ABNORMAL HIGH (ref 0.0–13.0)
NEUT%: 51.8 % (ref 39.6–76.8)
Platelets: 282 10*3/uL (ref 145–400)
lymph#: 1.1 10*3/uL (ref 0.9–3.3)

## 2007-06-01 HISTORY — PX: FEMUR SURGERY: SHX943

## 2007-07-05 ENCOUNTER — Ambulatory Visit: Payer: Self-pay | Admitting: Oncology

## 2007-07-10 LAB — CBC WITH DIFFERENTIAL/PLATELET
Basophils Absolute: 0.1 10*3/uL (ref 0.0–0.1)
EOS%: 1.5 % (ref 0.0–7.0)
Eosinophils Absolute: 0.1 10*3/uL (ref 0.0–0.5)
HGB: 14.9 g/dL (ref 11.6–15.9)
LYMPH%: 25.5 % (ref 14.0–48.0)
MCH: 29.9 pg (ref 26.0–34.0)
MCV: 85 fL (ref 81.0–101.0)
MONO%: 14.3 % — ABNORMAL HIGH (ref 0.0–13.0)
NEUT#: 2.7 10*3/uL (ref 1.5–6.5)
Platelets: 120 10*3/uL — ABNORMAL LOW (ref 145–400)

## 2007-07-11 LAB — HEPATITIS C ANTIBODY: HCV Ab: NEGATIVE

## 2007-07-14 LAB — CBC WITH DIFFERENTIAL/PLATELET
Basophils Absolute: 0.1 10*3/uL (ref 0.0–0.1)
EOS%: 0.2 % (ref 0.0–7.0)
HCT: 41.7 % (ref 34.8–46.6)
HGB: 14.7 g/dL (ref 11.6–15.9)
LYMPH%: 40.3 % (ref 14.0–48.0)
MCH: 29.5 pg (ref 26.0–34.0)
MCV: 83.9 fL (ref 81.0–101.0)
NEUT%: 36 % — ABNORMAL LOW (ref 39.6–76.8)
Platelets: 173 10*3/uL (ref 145–400)
lymph#: 1.3 10*3/uL (ref 0.9–3.3)

## 2007-08-01 LAB — CBC WITH DIFFERENTIAL/PLATELET
Basophils Absolute: 0.1 10*3/uL (ref 0.0–0.1)
Eosinophils Absolute: 0.3 10*3/uL (ref 0.0–0.5)
HGB: 12.4 g/dL (ref 11.6–15.9)
MCV: 86.8 fL (ref 81.0–101.0)
MONO#: 0.7 10*3/uL (ref 0.1–0.9)
MONO%: 10.4 % (ref 0.0–13.0)
NEUT#: 3.5 10*3/uL (ref 1.5–6.5)
Platelets: 341 10*3/uL (ref 145–400)
RBC: 4.11 10*6/uL (ref 3.70–5.32)
RDW: 14.1 % (ref 11.3–14.5)
WBC: 6.2 10*3/uL (ref 3.9–10.0)

## 2007-08-14 LAB — CBC WITH DIFFERENTIAL/PLATELET
Basophils Absolute: 0 10*3/uL (ref 0.0–0.1)
Eosinophils Absolute: 0.2 10*3/uL (ref 0.0–0.5)
HCT: 38.2 % (ref 34.8–46.6)
LYMPH%: 35.2 % (ref 14.0–48.0)
MCV: 88.3 fL (ref 81.0–101.0)
MONO#: 0.5 10*3/uL (ref 0.1–0.9)
MONO%: 11.3 % (ref 0.0–13.0)
NEUT#: 2.1 10*3/uL (ref 1.5–6.5)
NEUT%: 48.9 % (ref 39.6–76.8)
Platelets: 48 10*3/uL — ABNORMAL LOW (ref 145–400)
RBC: 4.33 10*6/uL (ref 3.70–5.32)
WBC: 4.3 10*3/uL (ref 3.9–10.0)

## 2007-08-16 LAB — CBC WITH DIFFERENTIAL/PLATELET
BASO%: 1.4 % (ref 0.0–2.0)
EOS%: 2.2 % (ref 0.0–7.0)
MCH: 30.1 pg (ref 26.0–34.0)
MCHC: 32.8 g/dL (ref 32.0–36.0)
NEUT%: 67.5 % (ref 39.6–76.8)
RDW: 15.6 % — ABNORMAL HIGH (ref 11.3–14.5)
lymph#: 0.6 10*3/uL — ABNORMAL LOW (ref 0.9–3.3)

## 2007-08-17 LAB — BETA-2 GLYCOPROTEIN ANTIBODIES
Beta-2 Glyco I IgG: 6 U/mL (ref <20)
Beta-2-Glycoprotein I IgM: <4 U/mL (ref <10)

## 2007-08-17 LAB — LUPUS ANTICOAGULANT PANEL
DRVVT 1:1 Mix: 40.4 secs (ref 36.1–47.0)
PTT Lupus Anticoagulant: 47.3 secs (ref 36.3–48.8)

## 2007-08-17 LAB — HEPATITIS B CORE ANTIBODY, TOTAL: Hep B Core Total Ab: POSITIVE — AB

## 2007-08-17 LAB — CARDIOLIPIN ANTIBODIES, IGG, IGM, IGA
Anticardiolipin IgA: 10 [APL'U] (ref <13)
Anticardiolipin IgG: <7 [GPL'U] (ref <11)

## 2007-08-18 ENCOUNTER — Ambulatory Visit: Payer: Self-pay | Admitting: Oncology

## 2007-08-23 LAB — CBC WITH DIFFERENTIAL/PLATELET
Basophils Absolute: 0 10*3/uL (ref 0.0–0.1)
Eosinophils Absolute: 0.1 10*3/uL (ref 0.0–0.5)
HCT: 38.9 % (ref 34.8–46.6)
HGB: 13.3 g/dL (ref 11.6–15.9)
MCH: 30.9 pg (ref 26.0–34.0)
MCV: 90.6 fL (ref 81.0–101.0)
MONO%: 14.1 % — ABNORMAL HIGH (ref 0.0–13.0)
NEUT#: 2.6 10*3/uL (ref 1.5–6.5)
NEUT%: 54.9 % (ref 39.6–76.8)
RDW: 14.9 % — ABNORMAL HIGH (ref 11.3–14.5)
lymph#: 1.4 10*3/uL (ref 0.9–3.3)

## 2007-08-30 LAB — CBC WITH DIFFERENTIAL/PLATELET
Basophils Absolute: 0.1 10*3/uL (ref 0.0–0.1)
EOS%: 1.3 % (ref 0.0–7.0)
Eosinophils Absolute: 0.1 10*3/uL (ref 0.0–0.5)
LYMPH%: 24.2 % (ref 14.0–48.0)
MCH: 30.1 pg (ref 26.0–34.0)
MCV: 89.1 fL (ref 81.0–101.0)
MONO%: 13.4 % — ABNORMAL HIGH (ref 0.0–13.0)
NEUT#: 3.7 10*3/uL (ref 1.5–6.5)
Platelets: 102 10*3/uL — ABNORMAL LOW (ref 145–400)
RBC: 4.51 10*6/uL (ref 3.70–5.32)

## 2007-09-06 LAB — CBC WITH DIFFERENTIAL/PLATELET
BASO%: 1.2 % (ref 0.0–2.0)
HCT: 41.4 % (ref 34.8–46.6)
LYMPH%: 30.2 % (ref 14.0–48.0)
MCH: 29.4 pg (ref 26.0–34.0)
MCHC: 33.1 g/dL (ref 32.0–36.0)
MONO#: 0.6 10*3/uL (ref 0.1–0.9)
NEUT%: 50.4 % (ref 39.6–76.8)
Platelets: 55 10*3/uL — ABNORMAL LOW (ref 145–400)
WBC: 4.1 10*3/uL (ref 3.9–10.0)

## 2007-09-13 LAB — CBC WITH DIFFERENTIAL/PLATELET
BASO%: 1.2 % (ref 0.0–2.0)
EOS%: 3.6 % (ref 0.0–7.0)
HCT: 39.1 % (ref 34.8–46.6)
LYMPH%: 26.3 % (ref 14.0–48.0)
MCH: 29.9 pg (ref 26.0–34.0)
MCHC: 33.5 g/dL (ref 32.0–36.0)
MCV: 89.2 fL (ref 81.0–101.0)
MONO%: 13.5 % — ABNORMAL HIGH (ref 0.0–13.0)
NEUT%: 55.3 % (ref 39.6–76.8)
Platelets: 78 10*3/uL — ABNORMAL LOW (ref 145–400)
lymph#: 1 10*3/uL (ref 0.9–3.3)

## 2007-09-19 LAB — CBC WITH DIFFERENTIAL/PLATELET
BASO%: 0.7 % (ref 0.0–2.0)
EOS%: 4.9 % (ref 0.0–7.0)
MCH: 29.8 pg (ref 26.0–34.0)
MCHC: 34.1 g/dL (ref 32.0–36.0)
MONO%: 16.5 % — ABNORMAL HIGH (ref 0.0–13.0)
RBC: 4.45 10*6/uL (ref 3.70–5.32)
RDW: 13.5 % (ref 11.3–14.5)
lymph#: 1.4 10*3/uL (ref 0.9–3.3)

## 2007-09-27 LAB — CBC WITH DIFFERENTIAL/PLATELET
Basophils Absolute: 0 10*3/uL (ref 0.0–0.1)
EOS%: 1.2 % (ref 0.0–7.0)
Eosinophils Absolute: 0.1 10*3/uL (ref 0.0–0.5)
HGB: 14 g/dL (ref 11.6–15.9)
NEUT#: 4.1 10*3/uL (ref 1.5–6.5)
RBC: 4.8 10*6/uL (ref 3.70–5.32)
RDW: 13.2 % (ref 11.3–14.5)
lymph#: 1.3 10*3/uL (ref 0.9–3.3)

## 2007-10-03 ENCOUNTER — Ambulatory Visit: Payer: Self-pay | Admitting: Oncology

## 2007-10-03 LAB — CBC WITH DIFFERENTIAL/PLATELET
Basophils Absolute: 0 10*3/uL (ref 0.0–0.1)
Eosinophils Absolute: 0.1 10*3/uL (ref 0.0–0.5)
HCT: 37.8 % (ref 34.8–46.6)
HGB: 13.2 g/dL (ref 11.6–15.9)
MCH: 29.7 pg (ref 26.0–34.0)
MCV: 85 fL (ref 81.0–101.0)
MONO%: 15.5 % — ABNORMAL HIGH (ref 0.0–13.0)
NEUT#: 2.8 10*3/uL (ref 1.5–6.5)
NEUT%: 58.3 % (ref 39.6–76.8)
Platelets: 124 10*3/uL — ABNORMAL LOW (ref 145–400)
RDW: 13.4 % (ref 11.3–14.5)

## 2007-10-31 LAB — CBC WITH DIFFERENTIAL/PLATELET
Basophils Absolute: 0 10*3/uL (ref 0.0–0.1)
EOS%: 6.2 % (ref 0.0–7.0)
HGB: 13.6 g/dL (ref 11.6–15.9)
LYMPH%: 27.7 % (ref 14.0–48.0)
MCH: 29 pg (ref 26.0–34.0)
MCV: 84.7 fL (ref 81.0–101.0)
MONO%: 14.9 % — ABNORMAL HIGH (ref 0.0–13.0)
RBC: 4.69 10*6/uL (ref 3.70–5.32)
RDW: 13.6 % (ref 11.3–14.5)

## 2007-11-06 LAB — CBC WITH DIFFERENTIAL/PLATELET
Basophils Absolute: 0.1 10*3/uL (ref 0.0–0.1)
Eosinophils Absolute: 0.1 10*3/uL (ref 0.0–0.5)
HCT: 41.3 % (ref 34.8–46.6)
LYMPH%: 21.7 % (ref 14.0–48.0)
MCV: 85.7 fL (ref 81.0–101.0)
MONO%: 12.8 % (ref 0.0–13.0)
NEUT#: 3.9 10*3/uL (ref 1.5–6.5)
NEUT%: 62.8 % (ref 39.6–76.8)
Platelets: 156 10*3/uL (ref 145–400)
RBC: 4.82 10*6/uL (ref 3.70–5.32)

## 2007-11-06 LAB — CARDIOLIPIN ANTIBODIES, IGG, IGM, IGA: Anticardiolipin IgM: 30 [MPL'U] (ref <10)

## 2007-11-20 ENCOUNTER — Ambulatory Visit: Payer: Self-pay | Admitting: Oncology

## 2007-11-20 LAB — CBC WITH DIFFERENTIAL/PLATELET
BASO%: 1.5 % (ref 0.0–2.0)
HCT: 42.3 % (ref 34.8–46.6)
LYMPH%: 32.3 % (ref 14.0–48.0)
MCH: 28.7 pg (ref 26.0–34.0)
MCHC: 33.4 g/dL (ref 32.0–36.0)
MCV: 85.8 fL (ref 81.0–101.0)
MONO%: 20.6 % — ABNORMAL HIGH (ref 0.0–13.0)
NEUT%: 41.9 % (ref 39.6–76.8)
Platelets: 117 10*3/uL — ABNORMAL LOW (ref 145–400)
RBC: 4.92 10*6/uL (ref 3.70–5.32)
WBC: 3.6 10*3/uL — ABNORMAL LOW (ref 3.9–10.0)

## 2007-11-20 LAB — MORPHOLOGY: PLT EST: DECREASED

## 2007-11-20 LAB — CHCC SMEAR

## 2007-11-21 ENCOUNTER — Ambulatory Visit (HOSPITAL_COMMUNITY): Admission: RE | Admit: 2007-11-21 | Discharge: 2007-11-21 | Payer: Self-pay | Admitting: Obstetrics and Gynecology

## 2007-11-27 LAB — CBC WITH DIFFERENTIAL/PLATELET
Basophils Absolute: 0 10*3/uL (ref 0.0–0.1)
EOS%: 8.4 % — ABNORMAL HIGH (ref 0.0–7.0)
Eosinophils Absolute: 0.2 10*3/uL (ref 0.0–0.5)
HCT: 39.6 % (ref 34.8–46.6)
HGB: 13.3 g/dL (ref 11.6–15.9)
MONO#: 0.5 10*3/uL (ref 0.1–0.9)
NEUT#: 1.1 10*3/uL — ABNORMAL LOW (ref 1.5–6.5)
NEUT%: 38.3 % — ABNORMAL LOW (ref 39.6–76.8)
RDW: 13.8 % (ref 11.3–14.5)
WBC: 2.9 10*3/uL — ABNORMAL LOW (ref 3.9–10.0)
lymph#: 1 10*3/uL (ref 0.9–3.3)

## 2007-12-11 LAB — CBC WITH DIFFERENTIAL/PLATELET
BASO%: 1.3 % (ref 0.0–2.0)
Eosinophils Absolute: 0.2 10*3/uL (ref 0.0–0.5)
MCHC: 33.3 g/dL (ref 32.0–36.0)
MONO#: 0.6 10*3/uL (ref 0.1–0.9)
NEUT#: 1.5 10*3/uL (ref 1.5–6.5)
RBC: 4.81 10*6/uL (ref 3.70–5.32)
RDW: 14.1 % (ref 11.3–14.5)
WBC: 3.4 10*3/uL — ABNORMAL LOW (ref 3.9–10.0)

## 2007-12-25 ENCOUNTER — Ambulatory Visit: Payer: Self-pay | Admitting: Oncology

## 2007-12-25 LAB — CBC WITH DIFFERENTIAL/PLATELET
BASO%: 1 % (ref 0.0–2.0)
Basophils Absolute: 0 10*3/uL (ref 0.0–0.1)
EOS%: 3.9 % (ref 0.0–7.0)
Eosinophils Absolute: 0.1 10*3/uL (ref 0.0–0.5)
HCT: 40.5 % (ref 34.8–46.6)
HGB: 13.6 g/dL (ref 11.6–15.9)
LYMPH%: 26.5 % (ref 14.0–48.0)
MCH: 28.6 pg (ref 26.0–34.0)
MCHC: 33.6 g/dL (ref 32.0–36.0)
MCV: 85.2 fL (ref 81.0–101.0)
MONO#: 0.6 10*3/uL (ref 0.1–0.9)
MONO%: 16.6 % — ABNORMAL HIGH (ref 0.0–13.0)
NEUT#: 1.9 10*3/uL (ref 1.5–6.5)
NEUT%: 52.1 % (ref 39.6–76.8)
Platelets: 155 10*3/uL (ref 145–400)
RBC: 4.76 10*6/uL (ref 3.70–5.32)
RDW: 14.5 % (ref 11.3–14.5)
WBC: 3.7 10*3/uL — ABNORMAL LOW (ref 3.9–10.0)
lymph#: 1 10*3/uL (ref 0.9–3.3)

## 2008-01-22 LAB — CBC WITH DIFFERENTIAL/PLATELET
BASO%: 0.8 % (ref 0.0–2.0)
EOS%: 8 % — ABNORMAL HIGH (ref 0.0–7.0)
LYMPH%: 32.6 % (ref 14.0–48.0)
MCH: 28.7 pg (ref 26.0–34.0)
MCHC: 33.7 g/dL (ref 32.0–36.0)
MCV: 85 fL (ref 81.0–101.0)
MONO%: 18.6 % — ABNORMAL HIGH (ref 0.0–13.0)
NEUT#: 1.4 10*3/uL — ABNORMAL LOW (ref 1.5–6.5)
RBC: 4.93 10*6/uL (ref 3.70–5.32)
RDW: 13.9 % (ref 11.3–14.5)

## 2008-02-15 ENCOUNTER — Ambulatory Visit: Payer: Self-pay | Admitting: Oncology

## 2008-02-19 LAB — CBC WITH DIFFERENTIAL/PLATELET
Basophils Absolute: 0 10*3/uL (ref 0.0–0.1)
EOS%: 5.5 % (ref 0.0–7.0)
HGB: 13.9 g/dL (ref 11.6–15.9)
LYMPH%: 26.7 % (ref 14.0–48.0)
MCH: 28.6 pg (ref 26.0–34.0)
MCV: 85.3 fL (ref 81.0–101.0)
MONO%: 17.4 % — ABNORMAL HIGH (ref 0.0–13.0)
NEUT%: 49.5 % (ref 39.6–76.8)
Platelets: 149 10*3/uL (ref 145–400)
RDW: 13.4 % (ref 11.3–14.5)

## 2008-03-04 LAB — CBC WITH DIFFERENTIAL/PLATELET
Basophils Absolute: 0 10*3/uL (ref 0.0–0.1)
EOS%: 1 % (ref 0.0–7.0)
HCT: 42.5 % (ref 34.8–46.6)
HGB: 14.1 g/dL (ref 11.6–15.9)
MCH: 28.5 pg (ref 26.0–34.0)
MCV: 85.9 fL (ref 81.0–101.0)
NEUT%: 65.7 % (ref 39.6–76.8)
lymph#: 1 10*3/uL (ref 0.9–3.3)

## 2008-03-05 ENCOUNTER — Ambulatory Visit (HOSPITAL_COMMUNITY): Admission: RE | Admit: 2008-03-05 | Discharge: 2008-03-05 | Payer: Self-pay | Admitting: Oncology

## 2008-03-25 ENCOUNTER — Encounter: Admission: RE | Admit: 2008-03-25 | Discharge: 2008-03-25 | Payer: Self-pay | Admitting: Otolaryngology

## 2008-04-30 HISTORY — PX: MASS EXCISION: SHX2000

## 2008-05-01 ENCOUNTER — Ambulatory Visit: Payer: Self-pay | Admitting: Oncology

## 2008-05-03 LAB — CBC WITH DIFFERENTIAL/PLATELET
BASO%: 1.1 % (ref 0.0–2.0)
Basophils Absolute: 0 10*3/uL (ref 0.0–0.1)
HCT: 40.1 % (ref 34.8–46.6)
HGB: 13.8 g/dL (ref 11.6–15.9)
MONO#: 0.7 10*3/uL (ref 0.1–0.9)
NEUT%: 36.2 % — ABNORMAL LOW (ref 39.6–76.8)
WBC: 3.1 10*3/uL — ABNORMAL LOW (ref 3.9–10.0)
lymph#: 1.1 10*3/uL (ref 0.9–3.3)

## 2008-05-16 ENCOUNTER — Ambulatory Visit (HOSPITAL_COMMUNITY): Admission: RE | Admit: 2008-05-16 | Discharge: 2008-05-17 | Payer: Self-pay | Admitting: Otolaryngology

## 2008-05-16 ENCOUNTER — Encounter (INDEPENDENT_AMBULATORY_CARE_PROVIDER_SITE_OTHER): Payer: Self-pay | Admitting: Otolaryngology

## 2008-07-03 ENCOUNTER — Ambulatory Visit: Payer: Self-pay | Admitting: Oncology

## 2008-07-05 LAB — CBC WITH DIFFERENTIAL/PLATELET
BASO%: 0.5 % (ref 0.0–2.0)
EOS%: 1.3 % (ref 0.0–7.0)
LYMPH%: 24.3 % (ref 14.0–48.0)
MCH: 29.9 pg (ref 26.0–34.0)
MCHC: 34 g/dL (ref 32.0–36.0)
MONO#: 0.8 10*3/uL (ref 0.1–0.9)
MONO%: 19.5 % — ABNORMAL HIGH (ref 0.0–13.0)
Platelets: 122 10*3/uL — ABNORMAL LOW (ref 145–400)
RBC: 4.82 10*6/uL (ref 3.70–5.32)
WBC: 4.1 10*3/uL (ref 3.9–10.0)

## 2008-07-22 LAB — CBC WITH DIFFERENTIAL/PLATELET
BASO%: 0.8 % (ref 0.0–2.0)
EOS%: 2.2 % (ref 0.0–7.0)
HCT: 42.5 % (ref 34.8–46.6)
MCHC: 33.6 g/dL (ref 31.5–36.0)
MONO#: 0.9 10*3/uL (ref 0.1–0.9)
RBC: 4.98 10*6/uL (ref 3.70–5.45)
RDW: 13.2 % (ref 11.2–14.5)
WBC: 3.7 10*3/uL — ABNORMAL LOW (ref 3.9–10.3)
lymph#: 1 10*3/uL (ref 0.9–3.3)
nRBC: 0 % (ref 0–0)

## 2008-08-05 LAB — CBC WITH DIFFERENTIAL/PLATELET
BASO%: 0.7 % (ref 0.0–2.0)
EOS%: 1 % (ref 0.0–7.0)
HCT: 39.9 % (ref 34.8–46.6)
LYMPH%: 31 % (ref 14.0–49.7)
MCH: 28.6 pg (ref 25.1–34.0)
MCHC: 33.6 g/dL (ref 31.5–36.0)
NEUT%: 47.4 % (ref 38.4–76.8)
Platelets: 148 10*3/uL (ref 145–400)

## 2008-08-20 ENCOUNTER — Ambulatory Visit: Payer: Self-pay | Admitting: Oncology

## 2008-08-20 LAB — CBC WITH DIFFERENTIAL/PLATELET
Basophils Absolute: 0 10*3/uL (ref 0.0–0.1)
EOS%: 2.5 % (ref 0.0–7.0)
HGB: 13.7 g/dL (ref 11.6–15.9)
MCH: 28.4 pg (ref 25.1–34.0)
MCV: 85.7 fL (ref 79.5–101.0)
MONO%: 20.4 % — ABNORMAL HIGH (ref 0.0–14.0)
RBC: 4.82 10*6/uL (ref 3.70–5.45)
RDW: 14.2 % (ref 11.2–14.5)

## 2008-09-03 LAB — CBC WITH DIFFERENTIAL/PLATELET
Basophils Absolute: 0 10*3/uL (ref 0.0–0.1)
Eosinophils Absolute: 0.1 10*3/uL (ref 0.0–0.5)
HGB: 13.8 g/dL (ref 11.6–15.9)
LYMPH%: 31.8 % (ref 14.0–49.7)
MONO#: 0.7 10*3/uL (ref 0.1–0.9)
NEUT#: 2.4 10*3/uL (ref 1.5–6.5)
Platelets: 129 10*3/uL — ABNORMAL LOW (ref 145–400)
RBC: 4.76 10*6/uL (ref 3.70–5.45)
WBC: 4.6 10*3/uL (ref 3.9–10.3)

## 2008-09-17 LAB — CBC WITH DIFFERENTIAL/PLATELET
Eosinophils Absolute: 0.1 10*3/uL (ref 0.0–0.5)
HCT: 43.2 % (ref 34.8–46.6)
LYMPH%: 38.3 % (ref 14.0–49.7)
MCHC: 33.9 g/dL (ref 31.5–36.0)
MONO#: 0.7 10*3/uL (ref 0.1–0.9)
NEUT#: 1.7 10*3/uL (ref 1.5–6.5)
NEUT%: 42.4 % (ref 38.4–76.8)
Platelets: 147 10*3/uL (ref 145–400)
WBC: 4 10*3/uL (ref 3.9–10.3)

## 2008-10-15 ENCOUNTER — Ambulatory Visit: Payer: Self-pay | Admitting: Oncology

## 2008-10-17 LAB — CBC WITH DIFFERENTIAL/PLATELET
Basophils Absolute: 0.1 10*3/uL (ref 0.0–0.1)
EOS%: 0.8 % (ref 0.0–7.0)
HGB: 14 g/dL (ref 11.6–15.9)
MCH: 29.6 pg (ref 25.1–34.0)
MCV: 87.5 fL (ref 79.5–101.0)
MONO%: 17.2 % — ABNORMAL HIGH (ref 0.0–14.0)
RDW: 13.9 % (ref 11.2–14.5)

## 2008-12-05 ENCOUNTER — Ambulatory Visit: Payer: Self-pay | Admitting: Oncology

## 2008-12-05 LAB — CBC WITH DIFFERENTIAL/PLATELET
BASO%: 0.6 % (ref 0.0–2.0)
EOS%: 1.7 % (ref 0.0–7.0)
HCT: 41.4 % (ref 34.8–46.6)
LYMPH%: 35.1 % (ref 14.0–49.7)
MCH: 29.2 pg (ref 25.1–34.0)
MCHC: 34.3 g/dL (ref 31.5–36.0)
MCV: 85.2 fL (ref 79.5–101.0)
MONO#: 0.7 10*3/uL (ref 0.1–0.9)
NEUT%: 44.6 % (ref 38.4–76.8)
Platelets: 122 10*3/uL — ABNORMAL LOW (ref 145–400)

## 2009-01-07 ENCOUNTER — Ambulatory Visit: Payer: Self-pay | Admitting: Oncology

## 2009-02-04 ENCOUNTER — Ambulatory Visit: Payer: Self-pay | Admitting: Oncology

## 2009-02-06 LAB — CBC WITH DIFFERENTIAL/PLATELET
Basophils Absolute: 0 10*3/uL (ref 0.0–0.1)
Eosinophils Absolute: 0.1 10*3/uL (ref 0.0–0.5)
HCT: 41.1 % (ref 34.8–46.6)
LYMPH%: 36.3 % (ref 14.0–49.7)
MONO#: 0.6 10*3/uL (ref 0.1–0.9)
NEUT#: 2 10*3/uL (ref 1.5–6.5)
NEUT%: 46.8 % (ref 38.4–76.8)
Platelets: 132 10*3/uL — ABNORMAL LOW (ref 145–400)
WBC: 4.2 10*3/uL (ref 3.9–10.3)

## 2009-04-01 ENCOUNTER — Ambulatory Visit: Payer: Self-pay | Admitting: Oncology

## 2009-04-03 LAB — CBC WITH DIFFERENTIAL/PLATELET
Basophils Absolute: 0 10*3/uL (ref 0.0–0.1)
Eosinophils Absolute: 0 10*3/uL (ref 0.0–0.5)
HCT: 41 % (ref 34.8–46.6)
HGB: 13.7 g/dL (ref 11.6–15.9)
MCV: 88.6 fL (ref 79.5–101.0)
MONO%: 15.9 % — ABNORMAL HIGH (ref 0.0–14.0)
NEUT#: 3.1 10*3/uL (ref 1.5–6.5)
NEUT%: 55 % (ref 38.4–76.8)
RDW: 13.4 % (ref 11.2–14.5)
lymph#: 1.6 10*3/uL (ref 0.9–3.3)

## 2009-05-27 ENCOUNTER — Ambulatory Visit: Payer: Self-pay | Admitting: Oncology

## 2009-05-29 LAB — CBC WITH DIFFERENTIAL/PLATELET
Basophils Absolute: 0 10*3/uL (ref 0.0–0.1)
EOS%: 0.8 % (ref 0.0–7.0)
HCT: 41.6 % (ref 34.8–46.6)
HGB: 14.1 g/dL (ref 11.6–15.9)
MCH: 30.1 pg (ref 25.1–34.0)
MONO#: 0.8 10*3/uL (ref 0.1–0.9)
NEUT#: 3.2 10*3/uL (ref 1.5–6.5)
RDW: 13.5 % (ref 11.2–14.5)
WBC: 6 10*3/uL (ref 3.9–10.3)
lymph#: 1.9 10*3/uL (ref 0.9–3.3)

## 2009-06-24 ENCOUNTER — Ambulatory Visit: Payer: Self-pay | Admitting: Oncology

## 2009-07-30 ENCOUNTER — Ambulatory Visit: Payer: Self-pay | Admitting: Oncology

## 2009-07-30 LAB — CBC WITH DIFFERENTIAL/PLATELET
BASO%: 0.6 % (ref 0.0–2.0)
Eosinophils Absolute: 0.1 10*3/uL (ref 0.0–0.5)
MCHC: 33.5 g/dL (ref 31.5–36.0)
MCV: 89.1 fL (ref 79.5–101.0)
NEUT#: 1.4 10*3/uL — ABNORMAL LOW (ref 1.5–6.5)
NEUT%: 41.7 % (ref 38.4–76.8)
WBC: 3.4 10*3/uL — ABNORMAL LOW (ref 3.9–10.3)
nRBC: 0 % (ref 0–0)

## 2009-08-28 LAB — CBC WITH DIFFERENTIAL/PLATELET
BASO%: 0.7 % (ref 0.0–2.0)
Basophils Absolute: 0 10*3/uL (ref 0.0–0.1)
EOS%: 1.1 % (ref 0.0–7.0)
Eosinophils Absolute: 0.1 10*3/uL (ref 0.0–0.5)
HCT: 42.1 % (ref 34.8–46.6)
MCH: 29.5 pg (ref 25.1–34.0)
MONO#: 0.8 10*3/uL (ref 0.1–0.9)
NEUT%: 43.6 % (ref 38.4–76.8)
RBC: 4.74 10*6/uL (ref 3.70–5.45)

## 2009-08-29 ENCOUNTER — Ambulatory Visit: Payer: Self-pay | Admitting: Oncology

## 2009-11-28 ENCOUNTER — Ambulatory Visit: Payer: Self-pay | Admitting: Oncology

## 2009-12-03 LAB — CBC WITH DIFFERENTIAL/PLATELET
Basophils Absolute: 0 10*3/uL (ref 0.0–0.1)
Eosinophils Absolute: 0.1 10*3/uL (ref 0.0–0.5)
LYMPH%: 32.2 % (ref 14.0–49.7)
MCHC: 33.6 g/dL (ref 31.5–36.0)
MONO%: 16.2 % — ABNORMAL HIGH (ref 0.0–14.0)
NEUT#: 2.8 10*3/uL (ref 1.5–6.5)
RBC: 5.03 10*6/uL (ref 3.70–5.45)
WBC: 5.7 10*3/uL (ref 3.9–10.3)
nRBC: 0 % (ref 0–0)

## 2010-02-04 ENCOUNTER — Ambulatory Visit: Payer: Self-pay | Admitting: Oncology

## 2010-02-04 LAB — CBC WITH DIFFERENTIAL/PLATELET
Basophils Absolute: 0 10*3/uL (ref 0.0–0.1)
Eosinophils Absolute: 0.1 10*3/uL (ref 0.0–0.5)
HGB: 14.3 g/dL (ref 11.6–15.9)
MCHC: 32.9 g/dL (ref 31.5–36.0)
MCV: 90.6 fL (ref 79.5–101.0)
MONO#: 0.7 10*3/uL (ref 0.1–0.9)
NEUT#: 1.6 10*3/uL (ref 1.5–6.5)
NEUT%: 38.8 % (ref 38.4–76.8)
Platelets: 150 10*3/uL (ref 145–400)
RBC: 4.79 10*6/uL (ref 3.70–5.45)

## 2010-03-30 ENCOUNTER — Ambulatory Visit: Payer: Self-pay | Admitting: Oncology

## 2010-04-01 LAB — CBC WITH DIFFERENTIAL/PLATELET
Basophils Absolute: 0 10*3/uL (ref 0.0–0.1)
Eosinophils Absolute: 0.1 10*3/uL (ref 0.0–0.5)
LYMPH%: 35.8 % (ref 14.0–49.7)
MCHC: 33.4 g/dL (ref 31.5–36.0)
MONO#: 0.7 10*3/uL (ref 0.1–0.9)
RBC: 4.49 10*6/uL (ref 3.70–5.45)
RDW: 13.4 % (ref 11.2–14.5)
nRBC: 0 % (ref 0–0)

## 2010-05-22 ENCOUNTER — Ambulatory Visit: Payer: Self-pay | Admitting: Oncology

## 2010-05-27 LAB — CBC WITH DIFFERENTIAL/PLATELET
Basophils Absolute: 0 10*3/uL (ref 0.0–0.1)
Eosinophils Absolute: 0 10*3/uL (ref 0.0–0.5)
LYMPH%: 31.4 % (ref 14.0–49.7)
MCH: 29.8 pg (ref 25.1–34.0)
MCHC: 34 g/dL (ref 31.5–36.0)
MONO#: 0.5 10*3/uL (ref 0.1–0.9)
NEUT%: 55.9 % (ref 38.4–76.8)
Platelets: 152 10*3/uL (ref 145–400)
WBC: 4.6 10*3/uL (ref 3.9–10.3)
lymph#: 1.4 10*3/uL (ref 0.9–3.3)

## 2010-06-29 LAB — COMPREHENSIVE METABOLIC PANEL
ALT: 14 U/L (ref 0–35)
Chloride: 107 mEq/L (ref 96–112)
GFR calc Af Amer: >60 mL/min (ref >60)
Potassium: 3.9 mEq/L (ref 3.5–5.1)

## 2010-06-29 LAB — CBC
Hemoglobin: 14.4 g/dL (ref 12.0–15.0)
MCHC: 33.3 g/dL (ref 30.0–36.0)
MCV: 88 fL (ref 78.0–100.0)
Platelets: 147 10*3/uL — ABNORMAL LOW (ref 150–400)
RBC: 4.91 MIL/uL (ref 3.87–5.11)
RDW: 13.4 % (ref 11.5–15.5)
WBC: 4.3 10*3/uL (ref 4.0–10.5)

## 2010-06-29 LAB — SURGICAL PCR SCREEN
MRSA, PCR: NEGATIVE
Staphylococcus aureus: NEGATIVE

## 2010-07-03 ENCOUNTER — Other Ambulatory Visit: Payer: Self-pay | Admitting: Obstetrics and Gynecology

## 2010-07-03 ENCOUNTER — Ambulatory Visit (HOSPITAL_COMMUNITY)
Admission: RE | Admit: 2010-07-03 | Discharge: 2010-07-03 | Disposition: A | Discharge status: Home / Self Care | Payer: BC Managed Care – PPO | Source: Ambulatory Visit | Attending: Obstetrics and Gynecology | Admitting: Obstetrics and Gynecology

## 2010-07-03 DIAGNOSIS — Z302 Encounter for sterilization: Secondary | ICD-10-CM | POA: Insufficient documentation

## 2010-07-03 DIAGNOSIS — N920 Excessive and frequent menstruation with regular cycle: Secondary | ICD-10-CM | POA: Insufficient documentation

## 2010-07-03 DIAGNOSIS — Z30432 Encounter for removal of intrauterine contraceptive device: Secondary | ICD-10-CM | POA: Insufficient documentation

## 2010-07-03 DIAGNOSIS — N803 Endometriosis of pelvic peritoneum, unspecified: Secondary | ICD-10-CM | POA: Insufficient documentation

## 2010-07-06 NOTE — Op Note (Signed)
[name], [name]              ACCOUNT NO.:  [id number]  MEDICAL RECORD NO.:  [id number]           PATIENT TYPE:  O  LOCATION:  [facility]                          [facility]  PHYSICIAN:  Guy Sandifer. Henderson Cloud, M.D. DATE OF BIRTH:  [birth year]  DATE OF PROCEDURE:  07/03/2010 DATE OF DISCHARGE:                              OPERATIVE REPORT   PREOPERATIVE DIAGNOSES: 1. Desires permanent sterilization. 2. Menorrhagia.  POSTOPERATIVE DIAGNOSES: 1. Desires permanent sterilization. 2. Menorrhagia. 3. Endometriosis.  PROCEDURE:  NovaSure endometrial ablation, hysteroscopy, removal of IUD, laparoscopy with bilateral tubal ligation with Filshie clips, and ablation of endometriosis.  SURGEON:  Guy Sandifer. Henderson Cloud, M.D.  ANESTHESIA:  General with endotracheal intubation.  ESTIMATED BLOOD LOSS:  Minimal.  I And Os with distending media 150 mL deficit.  INDICATIONS AND CONSENT:  The patient is a 35 year old married white female G2, P1 with Mirena IUD in place.  She has a history of heavy menses.  She desires permanent sterilization.  After careful discussion of options, removal of IUD, hysteroscopy, NovaSure endometrial ablation, and laparoscopy with tubal ligation with Filshie clips is discussed. Potential risks and complications were reviewed preoperatively including but limited to infection, uterine perforation, organ damage, bleeding requiring transfusion of blood products with HIV and hepatitis acquisition, DVT, PE, pneumonia.  Success and failure of the ablation as well as postoperative dysmenorrhea and/or pelvic pain or pain with intercourse has been reviewed.  Permanence of tubal ligation, failure rate, and increased ectopic risk has also been reviewed.  The patient also has ITP.  Platelet counts have been over 100,000, the patient is asymptomatic.  She is crossed for 2 units of packed red blood cells preoperatively.  FINDINGS:  Uterine cavity is atrophic and the fallopian tube  ostia are noted bilaterally.  There are no abnormal structures.  Abdominally, upper abdomen is normal.  Appendix is retrocecal and normal.  Uterus is retroverted, about 6 weeks in size.  There is an implant of endometriosis just lateral to the proximal portion of the right uterosacral ligament above the course of the ureter.  There is an implant in the posterior cul-de-sac as well as an implant in a pseudo- window located lateral to the proximal portion of the left uterosacral ligament.  Tubes and ovaries are normal bilaterally.  PROCEDURE:  The patient is taken to operating room where she is identified, placed in dorsal supine position, and general anesthesia was induced via endotracheal intubation.  She is then placed in the dorsal lithotomy position.  Time-out is undertaken.  She is prepped abdominally and vaginally, bladder straight catheterized, and she is draped in a sterile fashion.  Exam reveals uterus to be retroverted.  Bivalve speculum was placed.  The anterior cervical lip is injected with 1% plain Xylocaine and grasped with single-tooth tenaculum.  Paracervical block is placed at the 2, 4, 5, 7, 8, and 10 o'clock positions with approximately 20 mL total of 1% plain Xylocaine.  The cervix sounds to 4 cm and the cavity sounds to 8 cm.  Cervix is gently progressively dilated.  Diagnostic hysteroscope is placed and advanced under direct visualization using distending media.  The above findings are noted. The cavity is felt to be quite atrophic.  Therefore, D and C is not done.  The NovaSure device is placed.  After 3 attempts for cavity test which include changing the NovaSure generator, it fails to pass.  A new device is placed which passes on the first attempt.  Endometrial ablation is then carried out without difficulty.  The hysteroscope is again placed noting a good cautery effect throughout the cavity with no evidence of perforation.  Hulka tenaculum is placed in the  uterus as a manipulator and the single-tooth tenaculum is removed and attention is turned to the abdomen.  The infraumbilical and suprapubic areas are injected in midline with 0.5% plain Marcaine.  A small infraumbilical incision is made.  A disposable Veress needle is placed on the first attempt without difficulty.  A good syringe and drop test are noted and 2 L of gas are insufflated under low pressure with good tympany in the right upper quadrant.  Veress needle is removed and a 10/11 Xcel bladeless disposable trocar sleeve is placed using direct visualization with the diagnostic laparoscopic.  After placement, the operative laparoscope is used.  A small suprapubic incision is made in the midline just above her previous Pfannenstiel scar and a 5-mm Xcel bladeless disposable trocar sleeve is placed under direct visualization without difficulty.  The above findings are noted.  Then using bipolar cautery, the areas of endometriosis are ablated.  These are seen to be well clear of the course of the ureter.  Then the Filshie clip applicator was used to place a Filshie clip on the proximal one third of the fallopian tubes bilaterally.  After removal of the applicator, reinspection reveals the clip to be on the fallopian tube on the proximal one-third, the entire width of the tube to be within the clip, and the heel of the clip can be visualized through mesosalpinx.  The remaining approximately 22 mL of 0.5% plain Marcaine is placed in the peritoneal cavity.  Suprapubic trocar sleeve is removed and pneumoperitoneum is reduced and the umbilical trocar sleeve is removed.  The umbilical incision is closed with a 0 Vicryl on the fascia in a single stitch under good visualization.  The skin is closed with subcuticular 3-0 Vicryl on the umbilicus.  Dermabond is placed on both incisions.  Hulka tenaculum is removed and no bleeding is noted.  All counts are correct.  The patient is awakened, taken to  recovery room in stable condition.     Guy Sandifer Henderson Cloud, M.D.     JET/MEDQ  D:  07/03/2010  T:  07/04/2010  Job:  [id number]  Electronically Signed by Harold Hedge M.D. on 07/06/2010 09:03:11 AM

## 2010-07-07 LAB — CROSSMATCH
ABO/RH(D): O POS
Antibody Screen: NEGATIVE
Unit division: 0

## 2010-07-22 ENCOUNTER — Encounter (HOSPITAL_BASED_OUTPATIENT_CLINIC_OR_DEPARTMENT_OTHER): Payer: BC Managed Care – PPO | Admitting: Oncology

## 2010-07-22 ENCOUNTER — Other Ambulatory Visit: Payer: Self-pay | Admitting: Oncology

## 2010-07-22 DIAGNOSIS — D693 Immune thrombocytopenic purpura: Secondary | ICD-10-CM

## 2010-07-22 LAB — CBC WITH DIFFERENTIAL/PLATELET
BASO%: 0.4 % (ref 0.0–2.0)
Basophils Absolute: 0 10*3/uL (ref 0.0–0.1)
Eosinophils Absolute: 0.1 10*3/uL (ref 0.0–0.5)
MONO#: 0.7 10*3/uL (ref 0.1–0.9)
MONO%: 17 % — ABNORMAL HIGH (ref 0.0–14.0)
NEUT#: 1.9 10*3/uL (ref 1.5–6.5)
RBC: 4.61 10*6/uL (ref 3.70–5.45)
RDW: 14.2 % (ref 11.2–14.5)
lymph#: 1.4 10*3/uL (ref 0.9–3.3)

## 2010-09-08 ENCOUNTER — Encounter (HOSPITAL_BASED_OUTPATIENT_CLINIC_OR_DEPARTMENT_OTHER): Payer: BC Managed Care – PPO | Admitting: Oncology

## 2010-09-08 ENCOUNTER — Other Ambulatory Visit: Payer: Self-pay | Admitting: Oncology

## 2010-09-08 DIAGNOSIS — D693 Immune thrombocytopenic purpura: Secondary | ICD-10-CM

## 2010-09-08 LAB — CBC WITH DIFFERENTIAL/PLATELET
BASO%: 0.4 % (ref 0.0–2.0)
Eosinophils Absolute: 0.1 10*3/uL (ref 0.0–0.5)
LYMPH%: 33 % (ref 14.0–49.7)
MCH: 29.9 pg (ref 25.1–34.0)
MONO#: 0.7 10*3/uL (ref 0.1–0.9)
MONO%: 15.7 % — ABNORMAL HIGH (ref 0.0–14.0)
NEUT#: 2.3 10*3/uL (ref 1.5–6.5)
RDW: 13.6 % (ref 11.2–14.5)
WBC: 4.6 10*3/uL (ref 3.9–10.3)
nRBC: 0 % (ref 0–0)

## 2010-10-13 NOTE — Op Note (Signed)
[name], [name]              ACCOUNT NO.:  [id number]   MEDICAL RECORD NO.:  [id number]          PATIENT TYPE:  OIB   LOCATION:  5121                         FACILITY:  [facility]   PHYSICIAN:  Kinnie Scales. Annalee Genta, M.D.DATE OF BIRTH:  May 29, 1976   DATE OF PROCEDURE:  05/16/2008  DATE OF DISCHARGE:                               OPERATIVE REPORT   LOCATION:  [facility] Main OR.   PREOPERATIVE DIAGNOSES:  1. Right parotid mass.  2. History of idiopathic thrombocytopenic purpura.   POSTOPERATIVE DIAGNOSES:  1. Right parotid mass.  2. History of idiopathic thrombocytopenic purpura.   INDICATIONS FOR SURGERY:  1. Right parotid mass.  2. History of idiopathic thrombocytopenic purpura.   SURGICAL PROCEDURES:  Right superficial parotidectomy with nerve  integrity monitoring (Xomed NIMS).   SURGEON:  Kinnie Scales. Annalee Genta, MD   ASSISTANT:  Gloris Manchester. Lazarus Salines, MD   COMPLICATIONS:  None.   BLOOD LOSS:  Less than 50 mL.   The patient is transferred from the operating room to the recovery in  stable condition.   BRIEF HISTORY:  The patient is a 35 year old white female referred by  her medical oncologist, Dr. Cephas Darby for evaluation of a right  upper neck mass.  The patient had noted the mass approximately 6 months  prior and there was minimal change, no associated symptoms or pain, and  no history of parotid infection or lymphadenopathy.  The patient had a  history of idiopathic thrombocytopenic purpura (ITP) and was managed by  Dr. Cyndie Chime.  She had been over the last several years and platelet  counts were low, but stable and there was no significant issue of  bleeding or bruising.  She had undergone extensive orthopedic surgery in  February 2009 without complication.  Given the patient's history, CT  scan was obtained which showed a mass in the posterior aspect of the  right parotid gland measuring approximately 1.5 x 1 cm consistent with  an intraparotid  tumor.  The risk, benefits, and possible complications  of parotidectomy were discussed in detail with the patient.  She  understood and concurred with our plan for surgery which was scheduled  on an elective basis at [facility] Main OR with overnight  observation.   PROCEDURE:  The patient was brought to the operating room on May 16, 2008, placed in supine position on the operating table.  General  endotracheal anesthesia was established out difficulty.  When the  patient was adequately anesthetized, she was positioned on the operating  table.  She was injected with 3 mL of 1% lidocaine with 1:100,000  solution epinephrine which was injected to proposed skin incision.  She  was prepped and draped in a sterile fashion.  Prior to prep, the NIMS  monitoring electrodes were placed at the orbicularis oculi and  orbicularis oris muscle groups on the right-hand side and the muscles  were easily stimulated.  The NIMS was used throughout the facial nerve  dissection aspect of the surgical procedure for intraoperative nerve  monitoring.   Procedure was begun by creating an anterior preauricular incision on  the  right-hand side, which extended into the inferior auricular area in the  upper neck and preexisting skin crease.  A musculocutaneous flap was  then elevated from posterior to anterior along the superficial fascia of  the parotid gland.  The posterior aspect of the incision was dissected  including the cartilaginous tragus and the anterior border of the  sternocleidomastoid muscle allowing reflection of the parotid gland  anteriorly.  With the parotid adequately reflected, dissection was  carried out in the preauricular sulcus.  The main trunk of the facial  nerve was identified and each individual branch was then carefully  dissected using bipolar cautery and blunt and sharp dissection were  order to elevate the parotid gland including the intraparotid tumor  laterally  preserving the branch of the facial nerve.  The entire mass  was then resected.  This included a large cuff of normal tissue  surrounding the mass, which was sent to pathology for gross microscopic  evaluation.  With the mass resected, facial nerve branches were  identified and each branch was individually stimulated using the NIMS  monitor.  There were intact and stimulated well.  There was no evidence  of facial nerve injury.  [name] wound was then thoroughly  irrigated and suctioned.  There was minimal bleeding throughout the case  less than 50 mL.  A 7-French round drain was placed through the incision  to the depth of the surgical dissection and sutured into position.  The  patient's incision was then closed in multiple layers with deep fascial  closure consisting of interrupted 4-0 Vicryl suture, superficial  subcutaneous sutures consisting of interrupted 5-0 Vicryl and final skin  closure with a 6-0 running locked Ethilon suture.  The patient was then  awakened from her anesthetic.  She was extubated and was transferred  from the operating room to the recovery room in stable condition.  There  were no complications and blood loss was less than 50 mL.           ______________________________  Kinnie Scales. Annalee Genta, M.D.     DLS/MEDQ  D:  04/54/0981  T:  05/16/2008  Job:  [id number]   cc:   Genene Churn. Cyndie Chime, M.D.

## 2010-10-13 NOTE — Consult Note (Signed)
[name], [name]              ACCOUNT NO.:  [id number]   MEDICAL RECORD NO.:  [id number]          PATIENT TYPE:  OUT   LOCATION:  [facility]                           [facility]   PHYSICIAN:  Toma Copier, M.D.         DATE OF BIRTH:  Jan 28, 1976   DATE OF CONSULTATION:  11/21/2007  DATE OF DISCHARGE:                                 CONSULTATION   REASON FOR CONSULTATION:  This is a preconceptual consultation per Dr.  Guy Sandifer. Weeks's request, regarding a prior history of IUGR and  premature delivery, as well as the patient being diagnosed with  idiopathic thrombocytopenic purpura (ITP).   HISTORY OF PRESENT ILLNESS:  The patient is a 35 year old G 2, P 1, who  was followed by [facility] in consultation, as  well as by Dr. Genene Weeks. [name] of hematology/oncology, during the  last pregnancy that ended in a growth restricted fetus requiring an  emergent cesarean delivery due to non-reassuring fetal status at  approximately 33 Weeks of gestation.  The baby had a cerebrovascular  accident peri-delivery, with a normal platelet count at delivery.  The  baby is doing well now and has minimal sequela, but is being followed by  a neurologist.  [name] also underwent treatment during that  pregnancy with IV IG therapy, as well as steroid therapy. 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  She is  otherwise remaining stable and is being followed with weekly platelet  counts under Dr. Patsy Weeks supervision.  She presents today with her  husband and baby, inquiring about risks related to pregnancy and her  prior pregnancy history.   During her last pregnancy we saw her in consultation and discussed her  ITP whereby options for management included corticosteroids,  specifically prednisone, as well as IV IG.  Other considerations  included plasmapheresis as  well as a splenectomy.  We also addressed her  prior pregnancy diagnosis with warm autoantibodies and with this being  less of a concern from a fetal standpoint.   The primary focus of our consultation involved discussing the risks  related to a subsequent pregnancy and its effect on ITP, whether there  would be maternal and recurrent fetal risks.  Additionally we talked  about the risk of recurrence with IUGR and potential for pre-term birth.   ASSESSMENT/PLAN:  A 35 year old gravida 2, para 1, presenting for pre-  conceptual counseling.  1. Idiopathic thrombocytopenic purpura:  The patient is currently      being managed for her ITP with Dr. Cyndie Weeks and he is evaluating      her with serial platelet  counts.  She has been treated previously      with glucocorticoid as well as IV IG.  She did have a complication      with regards to a vascular necrosis of the femoral head and has      received graft therapy on her right leg.  Other considerations to  address would be whether she would be a candidate for a splenectomy      and whether other factors such as platelet transfusions would be      necessary in future pregnancies, but these options I would defer to      consultations with Dr. Cyndie Weeks.  Fetal considerations include      up to a 1% risk of intracranial hemorrhage in a subsequent      pregnancy; however, obstetrical management of ITP remains      controversial, and many clinicians feel that fetal scalp sampling,      cordocentesis and a cesarean delivery do not prevent these neonatal      bleeding complications.  If, however, the patient was strongly in      favor of an interventional management, one could consider fetal      scalp sampling to assess the fetal platelet count during the      pregnancy, although I feel that this would be a bit invasive for      the small risk involved; however, in the setting of a delivery,      there should be platelets, fresh frozen  plasma and IV IG available      and the neonatology team should be familiar with this disorder and      with the patient's history.  I answered all questions related to      her concerns of ITP recurrence and progression during a subsequent      pregnancy and she felt satisfied with these answers.  2. History of intrauterine growth retardation:  We discussed that,      although the IUGR may have been attributed to the ITP.  There may      have been underlying reasons for the presentation in this      pregnancy, and as such, any subsequent pregnancy carries up to a      20% risk for recurrence of IUGR and thus close monitoring in the      subsequent pregnancy is warranted.  This would include serial      ultrasounds for growth, consideration to low-dose aspirin therapy      and close monitoring of the ITP.  This seemed to be of greater      concern to [name], and we spent an extensive amount      of time addressing the uncertainty related to prevention of fetal      growth restriction.  3. Warm autoantibodies:  [name] was positive for warm      autoantibodies in her prior pregnancy, and this would need to be      followed closely in subsequent pregnancies, as there is risk for      both maternal and fetal anemia.   In conclusion of our consultation, we summarized the risks associated  with worsening of ITP, recurrence of IUGR and the presence of warm  autoantibodies in any future pregnancies.  We also addressed potential  increased risks related to other comorbidities of pregnancy that include  pre-eclampsia and pre-term birth.  In her case, progesterone therapy is  not indicated, as her pre-term birth was an indicated pre-term birth;  however, future risks for pre-eclampsia and other comorbidities do exist  and thus we addressed those issues as well.   The total face to face time during this consultation was 45 minutes.  We  have made ourselves available to readdress  any or all of these issues,  once [name] decide that they would like to consider moving  forward with pregnancy.   Thank you once again for allowing me to participate in the care of Ms.  [name].  If I can be of any further assistance, please feel free to  contact me.      Toma Copier, MD  Electronically Signed     ______________________________  Toma Copier, M.D.    SJ/MEDQ  D:  11/21/2007  T:  11/21/2007  Job:  [id number]

## 2010-10-16 NOTE — H&P (Signed)
[name], [name]              ACCOUNT NO.:  [id number]   MEDICAL RECORD NO.:  [id number]           PATIENT TYPE:   LOCATION:                                [facility]   PHYSICIAN:  Guy Sandifer. Henderson Cloud, M.D.      DATE OF BIRTH:   DATE OF ADMISSION:  09/28/2006  DATE OF DISCHARGE:                              HISTORY & PHYSICAL   REASON FOR ADMISSION:  Poor fetal growth.   HISTORY OF PRESENT ILLNESS:  This patient is a 35 year old married white  female, G-2, P-0, with an estimated date of confinement of 11/16/06  consistent with a 7 1/2 week ultrasound, 33 weeks estimated gestational  age.  Prenatal care has been complicated by thrombocytopenia which has  been evaluated by perinatal medicine as well as Dr. Genene Churn. [place]  who continues to follow her.  She is currently receiving Decadron on a  daily basis and her most recent platelet count was 184,000 on 09/26/06.  Pregnancy has also been complicated by warm autoantibodies in a titer of  less than 2 and hyperemesis earlier in pregnancy.  She presents to the  office today for evaluation and was noted to have a fasting capillary  blood glucose of 97.  Ultrasound revealed a vertex infant with an  estimated fetal weight of 1284 grams which is the first percentile.  Biophysical profile is 8 out of 8 and the placenta is grade 2 and  anterior.  Amniotic fluid index is 14.0 with a 47th percentile and the  umbilical Doppler's are normal.  The MCA Doppler's are abnormal at 0.82.  Fundal height today is 28 cm.   Past medical history, past surgical history, family history, obstetric  history:  See prenatal history and physical.   MEDICATIONS:  Decadron daily.  Prenatal vitamins daily.   ALLERGIES:  No known drug allergies.  She is sensitive to band-aid  adhesive as well as topical antibacterial agents.   SOCIAL HISTORY:  Denies tobacco, alcohol or drug abuse.   PHYSICAL EXAMINATION:  VITAL SIGNS:  Height 5 feet 5 inches, weight  146.8 pounds, blood pressure 122/80.  HEENT:  Without thyromegaly.  LUNGS:  Clear to auscultation.  HEART:  Regular rate and rhythm.  BREASTS:  Not examined.  ABDOMEN:  Gravid, nontender.  PELVIC EXAM:  Cervix not examined.  NEUROLOGIC:  Grossly within normal limits.   ASSESSMENT:  1. Intrauterine pregnancy at 48 weeks estimated gestational age.  2. Idiopathic thrombocytopenic purpura.  3. Warm autoantibodies.   PLAN:  Will admit to antenatal floor.  Continue external fetal  monitoring.  Will check PH panel and reticulocyte count.  I discussed  this with Dr. Margot Ables who will relay this to Dr. Rachel Bo for consultation  this afternoon.      Guy Sandifer Henderson Cloud, M.D.  Electronically Signed     JET/MEDQ  D:  09/28/2006  T:  09/28/2006  Job:  [id number]

## 2010-10-16 NOTE — Discharge Summary (Signed)
[name], [name]              ACCOUNT NO.:  [id number]   MEDICAL RECORD NO.:  [id number]          PATIENT TYPE:  INP   LOCATION:  9309                          FACILITY:  [facility]   PHYSICIAN:  Guy Sandifer. Henderson Cloud, M.D. DATE OF BIRTH:  1976/03/25   DATE OF ADMISSION:  09/28/2006  DATE OF DISCHARGE:  10/01/2006                               DISCHARGE SUMMARY   ADMITTING DIAGNOSES:  1. Anterior pregnancy at 14 weeks' estimated gestational age.  2. Idiopathic thrombocytopenic purpura.  3. Intrauterine growth retardation.   DISCHARGE DIAGNOSIS:  1. Anterior pregnancy at 46 weeks' estimated gestational age.  2. Idiopathic thrombocytopenic purpura.  3. Intrauterine growth retardation.   PROCEDURE:  On September 28, 2006, low transverse cesarean section.   REASON FOR ADMISSION:  This patient is a 35 year old, married, white  female G2, P0 with an EDC of November 16, 2006, consistent with a seven and  a half-week ultrasound, whose pregnancy has been complicated by ITP  managed by Dr. Olga Weeks.  She has also been evaluated and  followed by perinatology.  Her platelet count has been maintained most  recently on daily Decadron.  She also had hyperemesis early in pregnancy  and also has a positive warm autoantibody with a titer of less than 2.  During an office visit on September 28, 2006, an ultrasound was obtained and  an estimated fetal weight at the 1st percentile was noted.  The MCA was  decreased to 0.82, and amniotic fluid was normal with a biophysical  profile of 8 out of 8.   HOSPITAL COURSE:  The patient is admitted to the hospital for  observation.  She is admitted to the antenatal floor and maintained on a  continuous external fetal monitoring.  Perinatal consultation is  obtained.  The initial plan is for close observation and re-evaluation  with ultrasound in 2 days.  On that same evening of admission, the fetal  heart rate was noted to be in the 140s, with few accelerations.   There  were 4 contractions and 4 decelerations noted within approximately 1 to  1-1/2 hours.  After discussing the options, she is taken to the  operating room for a low transverse cesarean section.  This was  productive of a viable female infant, Apgar'Heather of 7 and 8 at one and five  minutes, respectively.  On the first postoperative day, vital signs were stable.  She is  afebrile.  Platelet count is 167,000.  It should be noted that platelets  on admission were 184,000.  They were 167,000 on the first postoperative  day.  The patient was also followed postoperatively by Dr. Wyvonna Weeks  who continued her Decadron at the same dosing of 8 mg in the morning and  4 mg mid day.  On the second postoperative day, she is tolerating a regular diet, has  stable vital signs and is afebrile.  Platelet count is 160,000.  On the third postoperative day, she is without complaint, ambulating,  passing flatus, doing well.   CONDITION ON DISCHARGE:  Good.   DIET:  Regular as tolerated.   ACTIVITY:  No lifting, no operation of automobiles, no vaginal entry.   She is to call the office for problems including, but not limited to,  temperature of 101 degrees, persistent nausea, vomiting, heavy bleeding  or increasing pain.   MEDICATIONS:  1. Vicodin #30, one to two p.o. q.6 h p.r.n.  2. Plain Tylenol 325 mg #2 p.o. q.6 h p.r.n.  3. She will also continue prenatal vitamins.  4. Decadron per Dr. Patsy Weeks direction.   FOLLOWUP:  1. Is with Dr. Cyndie Weeks on Monday.  2. In my office in 2 weeks.      Guy Sandifer Henderson Cloud, M.D.  Electronically Signed     JET/MEDQ  D:  10/01/2006  T:  10/01/2006  Job:  [id number]   cc:   [name]. [name], M.D.  Fax: [phone]   Heather L. Rachel Bo, MD

## 2010-12-04 ENCOUNTER — Encounter (HOSPITAL_BASED_OUTPATIENT_CLINIC_OR_DEPARTMENT_OTHER): Payer: BC Managed Care – PPO | Admitting: Oncology

## 2010-12-04 ENCOUNTER — Other Ambulatory Visit: Payer: Self-pay | Admitting: Oncology

## 2010-12-04 DIAGNOSIS — D693 Immune thrombocytopenic purpura: Secondary | ICD-10-CM

## 2010-12-04 LAB — CBC WITH DIFFERENTIAL/PLATELET
Basophils Absolute: 0 10*3/uL (ref 0.0–0.1)
EOS%: 0.5 % (ref 0.0–7.0)
Eosinophils Absolute: 0 10*3/uL (ref 0.0–0.5)
HCT: 39.7 % (ref 34.8–46.6)
MCH: 30.2 pg (ref 25.1–34.0)
MONO#: 0.8 10*3/uL (ref 0.1–0.9)
NEUT%: 59.3 % (ref 38.4–76.8)
RBC: 4.54 10*6/uL (ref 3.70–5.45)
lymph#: 1.5 10*3/uL (ref 0.9–3.3)

## 2011-01-11 ENCOUNTER — Encounter (HOSPITAL_BASED_OUTPATIENT_CLINIC_OR_DEPARTMENT_OTHER): Payer: BC Managed Care – PPO | Admitting: Oncology

## 2011-01-11 ENCOUNTER — Other Ambulatory Visit: Payer: Self-pay | Admitting: Oncology

## 2011-01-11 DIAGNOSIS — D693 Immune thrombocytopenic purpura: Secondary | ICD-10-CM

## 2011-01-11 LAB — CBC WITH DIFFERENTIAL/PLATELET
Eosinophils Absolute: 0.1 10*3/uL (ref 0.0–0.5)
HCT: 41.4 % (ref 34.8–46.6)
MCHC: 33.8 g/dL (ref 31.5–36.0)
MCV: 90.2 fL (ref 79.5–101.0)
NEUT#: 1.4 10*3/uL — ABNORMAL LOW (ref 1.5–6.5)
RBC: 4.59 10*6/uL (ref 3.70–5.45)
WBC: 3.1 10*3/uL — ABNORMAL LOW (ref 3.9–10.3)

## 2011-01-22 NOTE — H&P (Signed)
  [name], [name]              ACCOUNT NO.:  [id number]  MEDICAL RECORD NO.:  [id number]           PATIENT TYPE:  LOCATION:                                 FACILITY:  PHYSICIAN:  Guy Sandifer. Henderson Cloud, M.D. DATE OF BIRTH:  08/11/1987  DATE OF ADMISSION:  07/03/2010 DATE OF DISCHARGE:                             HISTORY & PHYSICAL   CHIEF COMPLAINT:  Heavy menses and desires permanent sterilization.  HISTORY OF PRESENT ILLNESS:  This patient is a 35 year old married white female G2, P1 who currently has Mirena IUD in place.  She has a history of heavy menses.  She would also like permanent sterilization.  After careful discussion of options, she is being admitted for laparoscopic bilateral tubal ligation, removal of IUD, hysteroscopy, D&C, and NovaSure endometrial ablation.  Potential risks and complications and alternatives have been discussed preoperatively.  PAST MEDICAL HISTORY: 1. Idiopathic thrombocytopenia purpura.  She is followed by Dr.     Cyndie Chime.  No recent complaints of nosebleeds, gum bleeds, etc.     Her platelet counts have been well over 100,000. 2. History of abnormal Pap smear. 3. History of skin cancer.  FAMILY HISTORY:  Positive for diabetes and colon cancer in her mother.  PAST SURGICAL HISTORY: 1. Aseptic necrosis with subsequent hip surgery. 2. Arthroscopy on her knee 1996. 3. Finger surgery 1992. 4. Cyst removed from her neck 2009.  OBSTETRICAL HISTORY:  Cesarean section x1.  MEDICATIONS:  Minocycline 100 mg daily, multivitamin daily.  ALLERGIES:  NEOSPORIN leading to rash, BAND-AIDS leading to rash.  SOCIAL HISTORY:  Alcohol, 1-2 drinks a day.  Denies drug or tobacco abuse.  REVIEW OF SYSTEMS:  NEURO:  Denies headache.  CARDIAC:  Denies chest pain.  PULMONARY:  Denies shortness of breath.  PHYSICAL EXAMINATION:  VITAL SIGNS:  Height 5 feet 5 inches, weight 126.2 pounds, blood pressure 98/66. LUNGS:  Clear to auscultation. HEART:   Regular rate and rhythm. ABDOMEN:  Soft, nontender without masses. PELVIC:  Vulva, vagina, cervix without lesion.  IUD string noted. Uterus normal size, mobile, nontender.  Adnexa nontender without masses.  ASSESSMENT: 1. Desires permanent sterilization. 2. Menorrhagia.  PLAN:  Laparoscopy, bilateral tubal ligation, removal of IUD, hysteroscopy, D&C, NovaSure endometrial ablation.     Guy Sandifer Henderson Cloud, M.D.     JET/MEDQ  D:  06/29/2010  T:  06/30/2010  Job:  [id number]  Electronically Signed by Harold Hedge M.D. on 08/17/2010 09:13:43 AM

## 2011-03-05 LAB — CBC
Hemoglobin: 15.9 g/dL — ABNORMAL HIGH (ref 12.0–15.0)
MCHC: 33.3 g/dL (ref 30.0–36.0)
Platelets: 126 10*3/uL — ABNORMAL LOW (ref 150–400)
RDW: 13.7 % (ref 11.5–15.5)

## 2011-03-05 LAB — PROTIME-INR: INR: 0.9 (ref 0.00–1.49)

## 2011-03-08 ENCOUNTER — Other Ambulatory Visit: Payer: Self-pay | Admitting: Oncology

## 2011-03-08 ENCOUNTER — Encounter (HOSPITAL_BASED_OUTPATIENT_CLINIC_OR_DEPARTMENT_OTHER): Payer: BC Managed Care – PPO | Admitting: Oncology

## 2011-03-08 DIAGNOSIS — D693 Immune thrombocytopenic purpura: Secondary | ICD-10-CM

## 2011-03-08 LAB — CBC WITH DIFFERENTIAL/PLATELET
Basophils Absolute: 0 10*3/uL (ref 0.0–0.1)
EOS%: 1.2 % (ref 0.0–7.0)
HGB: 13.9 g/dL (ref 11.6–15.9)
MCH: 30 pg (ref 25.1–34.0)
MCV: 88.3 fL (ref 79.5–101.0)
MONO%: 17.9 % — ABNORMAL HIGH (ref 0.0–14.0)
NEUT%: 56.4 % (ref 38.4–76.8)
RDW: 13.2 % (ref 11.2–14.5)

## 2011-05-03 ENCOUNTER — Telehealth: Payer: Self-pay

## 2011-05-03 ENCOUNTER — Other Ambulatory Visit: Payer: Self-pay | Admitting: Oncology

## 2011-05-03 ENCOUNTER — Other Ambulatory Visit (HOSPITAL_BASED_OUTPATIENT_CLINIC_OR_DEPARTMENT_OTHER): Payer: BC Managed Care – PPO | Admitting: Lab

## 2011-05-03 ENCOUNTER — Other Ambulatory Visit: Payer: Self-pay

## 2011-05-03 DIAGNOSIS — D693 Immune thrombocytopenic purpura: Secondary | ICD-10-CM

## 2011-05-03 LAB — CBC WITH DIFFERENTIAL/PLATELET
Basophils Absolute: 0 10*3/uL (ref 0.0–0.1)
EOS%: 1.3 % (ref 0.0–7.0)
HCT: 39.5 % (ref 34.8–46.6)
HGB: 13.4 g/dL (ref 11.6–15.9)
LYMPH%: 31.4 % (ref 14.0–49.7)
MCH: 29.8 pg (ref 25.1–34.0)
MCV: 87.8 fL (ref 79.5–101.0)
MONO%: 22 % — ABNORMAL HIGH (ref 0.0–14.0)
NEUT%: 45 % (ref 38.4–76.8)
Platelets: 138 10*3/uL — ABNORMAL LOW (ref 145–400)

## 2011-05-03 NOTE — Telephone Encounter (Signed)
Pt notified of platelet count per Dr Patsy Lager note below.  dph

## 2011-05-03 NOTE — Telephone Encounter (Signed)
Message copied by Albertha Ghee on Mon May 03, 2011  2:43 PM ------      Message from: Levert Feinstein      Created: Mon May 03, 2011  2:10 PM       Call patient - platelet count stable @ 138,000  Check again in 3 months  G

## 2011-08-04 ENCOUNTER — Other Ambulatory Visit (HOSPITAL_BASED_OUTPATIENT_CLINIC_OR_DEPARTMENT_OTHER): Payer: BC Managed Care – PPO | Admitting: Lab

## 2011-08-04 DIAGNOSIS — D6959 Other secondary thrombocytopenia: Secondary | ICD-10-CM

## 2011-08-04 LAB — CBC WITH DIFFERENTIAL/PLATELET
BASO%: 0.6 % (ref 0.0–2.0)
LYMPH%: 30.1 % (ref 14.0–49.7)
MCHC: 33.7 g/dL (ref 31.5–36.0)
MCV: 87.7 fL (ref 79.5–101.0)
MONO%: 17.6 % — ABNORMAL HIGH (ref 0.0–14.0)
NEUT%: 50.5 % (ref 38.4–76.8)
Platelets: 149 10*3/uL (ref 145–400)
RBC: 4.7 10*6/uL (ref 3.70–5.45)
nRBC: 0 % (ref 0–0)

## 2011-08-31 ENCOUNTER — Ambulatory Visit: Payer: BC Managed Care – PPO | Admitting: Oncology

## 2011-09-28 ENCOUNTER — Encounter: Payer: Self-pay | Admitting: Oncology

## 2011-09-28 ENCOUNTER — Telehealth: Payer: Self-pay | Admitting: Oncology

## 2011-09-28 ENCOUNTER — Ambulatory Visit (HOSPITAL_BASED_OUTPATIENT_CLINIC_OR_DEPARTMENT_OTHER): Payer: BC Managed Care – PPO | Admitting: Oncology

## 2011-09-28 VITALS — BP 101/65 | HR 70 | Temp 97.5°F | Ht 65.0 in | Wt 130.0 lb

## 2011-09-28 DIAGNOSIS — M8700 Idiopathic aseptic necrosis of unspecified bone: Secondary | ICD-10-CM

## 2011-09-28 DIAGNOSIS — D72819 Decreased white blood cell count, unspecified: Secondary | ICD-10-CM

## 2011-09-28 DIAGNOSIS — K118 Other diseases of salivary glands: Secondary | ICD-10-CM

## 2011-09-28 DIAGNOSIS — D693 Immune thrombocytopenic purpura: Secondary | ICD-10-CM

## 2011-09-28 DIAGNOSIS — D72821 Monocytosis (symptomatic): Secondary | ICD-10-CM

## 2011-09-28 HISTORY — DX: Idiopathic aseptic necrosis of unspecified bone: M87.00

## 2011-09-28 HISTORY — DX: Immune thrombocytopenic purpura: D69.3

## 2011-09-28 HISTORY — DX: Other diseases of salivary glands: K11.8

## 2011-09-28 NOTE — Telephone Encounter (Signed)
appts made and printed for pt aom °

## 2011-09-29 ENCOUNTER — Encounter: Payer: Self-pay | Admitting: Oncology

## 2011-09-29 DIAGNOSIS — D72819 Decreased white blood cell count, unspecified: Secondary | ICD-10-CM

## 2011-09-29 DIAGNOSIS — D72821 Monocytosis (symptomatic): Secondary | ICD-10-CM

## 2011-09-29 HISTORY — DX: Decreased white blood cell count, unspecified: D72.819

## 2011-09-29 HISTORY — DX: Monocytosis (symptomatic): D72.821

## 2011-09-29 NOTE — Progress Notes (Signed)
Hematology and Oncology Follow Up Visit  [name] [id number] Mar 24, 1976 35 y.o. 09/29/2011 9:21 AM   Principle Diagnosis: Encounter Diagnoses  Name Primary?  . Chronic ITP (idiopathic thrombocytopenia) Yes  . Aseptic necrosis bone   . 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 She was treated with a combination of anti-D immunoglobulin and steroids and she achieved a near-complete response. She developed aseptic necrosis of her hips due to the steroid use and had to undergo a right hip replacement at [facility] in February 2009. Left hip has been stable on followup exams and has not required surgery. We have monitored her blood counts closely through this office. Platelet count has been consistently over 100,000 off all treatment now for about 5 years. Counts fluctuate and over the last year have ranged from a low of 112,000 recorded in July 2012  to 150,000 recorded in April 2012. Most recent value from 08/04/2011 is 149,000.  She has had no interim medical problems. She is on no chronic medications. She took a leave of absence from work to devote extra time to her special needs child. Her daughter will be 73 years old  this week.  Medications: reviewed  Allergies: No Known Allergies  Review of Systems: Constitutional: No constitutional symptoms    Respiratory: No cough or dyspnea Cardiovascular:  No chest pain or palpitations Gastrointestinal: No change in bowel habit Genito-Urinary: Not questions Musculoskeletal: No bone pain. Annual followup of her avascular necrosis at [facility]. Neurologic: No headache or change in vision Skin: No rash or ecchymosis Remaining ROS negative.  Physical Exam: Blood pressure 101/65, pulse 70, temperature 97.5 F (36.4 C), temperature source Oral, height 5\' 5"  (1.651 m), weight 130 lb (58.968 kg). Wt Readings from Last 3 Encounters:    09/28/11 130 lb (58.968 kg)     General appearance: Healthy appearing Caucasian woman HENNT: Pharynx no erythema or exudate Lymph nodes: No cervical supraclavicular or axillary adenopathy Breasts: Not examined Lungs: Clear to auscultation resonant to percussion Heart: Regular rhythm no murmur Abdomen: Soft nontender no mass no organomegaly Extremities: No edema no calf tenderness Vascular: No cyanosis Neurologic: Pupils equal round reactive to light optic disc sharp vessels normal no hemorrhage or exudate. Motor strength 5 over 5. Reflexes 1+ symmetric Skin: No rash or ecchymosis  Lab Results: Lab Results  Component Value Date   WBC 3.5* 08/04/2011   HGB 13.9 08/04/2011   HCT 41.2 08/04/2011   MCV 87.7 08/04/2011   PLT 149 08/04/2011     Chemistry      Component Value Date/Time   NA 138 06/29/2010 1117   K 3.9 06/29/2010 1117   CL 107 06/29/2010 1117   CO2 23 06/29/2010 1117   BUN 13 06/29/2010 1117   CREATININE 0.74 06/29/2010 1117      Component Value Date/Time   CALCIUM 10.2 06/29/2010 1117   ALKPHOS 59 06/29/2010 1117   AST 16 06/29/2010 1117   ALT 14 06/29/2010 1117   BILITOT 0.6 06/29/2010 1117       Impression and Plan: #1. ITP. Stable platelet count off all treatment. I will decrease frequency of lab monitoring to every 3 months.  #2. 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 Status post right total hip replacement with a bone graft 07/20/2007. She will continue followup with orthopedic surgery at [facility]. She  tells me after the 5 year mark if everything is stable  they will release her.  #3. Status post excision benign right parotid mass 05/16/2008  #4. Chronic mild leukopenia with white counts fluctuating between 3100 & 4,900 with persistent but stable monocytosis 13-22% of unclear etiology    CC:. Dr. Harold Hedge   Levert Feinstein, MD 5/1/20139:21 AM

## 2011-11-02 ENCOUNTER — Other Ambulatory Visit (HOSPITAL_BASED_OUTPATIENT_CLINIC_OR_DEPARTMENT_OTHER): Payer: BC Managed Care – PPO | Admitting: Lab

## 2011-11-02 DIAGNOSIS — D693 Immune thrombocytopenic purpura: Secondary | ICD-10-CM

## 2011-11-02 LAB — CBC WITH DIFFERENTIAL/PLATELET
BASO%: 0.4 % (ref 0.0–2.0)
EOS%: 0.2 % (ref 0.0–7.0)
HCT: 41.4 % (ref 34.8–46.6)
MCH: 30.3 pg (ref 25.1–34.0)
MCHC: 33.2 g/dL (ref 31.5–36.0)
MCV: 91.3 fL (ref 79.5–101.0)
MONO%: 9.1 % (ref 0.0–14.0)
NEUT%: 76.7 % (ref 38.4–76.8)
RDW: 13.5 % (ref 11.2–14.5)
lymph#: 0.9 10*3/uL (ref 0.9–3.3)

## 2011-11-02 LAB — COMPREHENSIVE METABOLIC PANEL
AST: 14 U/L (ref 0–37)
Albumin: 4.1 g/dL (ref 3.5–5.2)
Alkaline Phosphatase: 54 U/L (ref 39–117)
BUN: 13 mg/dL (ref 6–23)
Creatinine, Ser: 0.78 mg/dL (ref 0.50–1.10)
Potassium: 4.6 mEq/L (ref 3.5–5.3)
Total Bilirubin: 0.5 mg/dL (ref 0.3–1.2)

## 2011-11-03 ENCOUNTER — Telehealth: Payer: Self-pay | Admitting: *Deleted

## 2011-11-03 NOTE — Telephone Encounter (Signed)
Pt notified via vm on identified vm that platelets & chem profile normal per Dr. Cyndie Chime.

## 2011-11-03 NOTE — Telephone Encounter (Signed)
Message copied by Sabino Snipes on Wed Nov 03, 2011 12:56 PM ------      Message from: Levert Feinstein      Created: Wed Nov 03, 2011  8:44 AM       Call pt  Platelets 187,000; blood chemistry profile normal;

## 2012-01-27 ENCOUNTER — Telehealth: Payer: Self-pay | Admitting: Oncology

## 2012-01-27 NOTE — Telephone Encounter (Signed)
S/w the pt and she is aware of her r/s lab appt from 02/01/2012 to 02/02/2012

## 2012-02-01 ENCOUNTER — Other Ambulatory Visit: Payer: BC Managed Care – PPO

## 2012-02-02 ENCOUNTER — Other Ambulatory Visit (HOSPITAL_BASED_OUTPATIENT_CLINIC_OR_DEPARTMENT_OTHER): Payer: BC Managed Care – PPO | Admitting: Lab

## 2012-02-02 ENCOUNTER — Telehealth: Payer: Self-pay | Admitting: *Deleted

## 2012-02-02 DIAGNOSIS — D693 Immune thrombocytopenic purpura: Secondary | ICD-10-CM

## 2012-02-02 LAB — CBC WITH DIFFERENTIAL/PLATELET
Basophils Absolute: 0 10*3/uL (ref 0.0–0.1)
Eosinophils Absolute: 0 10*3/uL (ref 0.0–0.5)
HCT: 40.6 % (ref 34.8–46.6)
HGB: 14 g/dL (ref 11.6–15.9)
LYMPH%: 20 % (ref 14.0–49.7)
MCV: 87.5 fL (ref 79.5–101.0)
MONO#: 0.7 10*3/uL (ref 0.1–0.9)
MONO%: 13.2 % (ref 0.0–14.0)
NEUT#: 3.6 10*3/uL (ref 1.5–6.5)
NEUT%: 65.7 % (ref 38.4–76.8)
Platelets: 160 10*3/uL (ref 145–400)
WBC: 5.5 10*3/uL (ref 3.9–10.3)

## 2012-02-02 NOTE — Telephone Encounter (Signed)
Message copied by Sherre Poot on Wed Feb 02, 2012  4:56 PM ------      Message from: Sabino Snipes      Created: Wed Feb 02, 2012  4:47 PM                   ----- Message -----         From: Levert Feinstein, MD         Sent: 02/02/2012   1:42 PM           To: Orbie Hurst, RN, Sabino Snipes, RN, #            Call pt platelets 160,000

## 2012-02-07 ENCOUNTER — Telehealth: Payer: Self-pay | Admitting: *Deleted

## 2012-02-07 NOTE — Telephone Encounter (Signed)
Called patient; gave lab results (specifically platelets) drawn 02/02/2012.  Patient verbalized understanding.

## 2012-05-02 ENCOUNTER — Other Ambulatory Visit: Payer: BC Managed Care – PPO

## 2012-05-02 ENCOUNTER — Other Ambulatory Visit (HOSPITAL_BASED_OUTPATIENT_CLINIC_OR_DEPARTMENT_OTHER): Payer: BC Managed Care – PPO | Admitting: Lab

## 2012-05-02 DIAGNOSIS — D693 Immune thrombocytopenic purpura: Secondary | ICD-10-CM

## 2012-05-02 LAB — CBC WITH DIFFERENTIAL/PLATELET
BASO%: 0.7 % (ref 0.0–2.0)
Basophils Absolute: 0 10*3/uL (ref 0.0–0.1)
EOS%: 2.5 % (ref 0.0–7.0)
HCT: 43.3 % (ref 34.8–46.6)
HGB: 14.3 g/dL (ref 11.6–15.9)
LYMPH%: 20.4 % (ref 14.0–49.7)
MCH: 29.7 pg (ref 25.1–34.0)
MCHC: 33 g/dL (ref 31.5–36.0)
NEUT%: 62.5 % (ref 38.4–76.8)
Platelets: 174 10*3/uL (ref 145–400)
lymph#: 1.2 10*3/uL (ref 0.9–3.3)

## 2012-05-05 ENCOUNTER — Telehealth: Payer: Self-pay | Admitting: *Deleted

## 2012-05-05 NOTE — Telephone Encounter (Signed)
Spoke with patient and let her know that platelets are normal at 174K.  She appreciated the call.

## 2012-05-05 NOTE — Telephone Encounter (Signed)
Message copied by Orbie Hurst on Fri May 05, 2012 12:10 PM ------      Message from: Levert Feinstein      Created: Tue May 02, 2012 12:58 PM       Call pt platelets normal at 174,000

## 2012-07-31 ENCOUNTER — Telehealth: Payer: Self-pay | Admitting: Oncology

## 2012-07-31 ENCOUNTER — Other Ambulatory Visit: Payer: Self-pay | Admitting: Oncology

## 2012-07-31 ENCOUNTER — Other Ambulatory Visit (HOSPITAL_BASED_OUTPATIENT_CLINIC_OR_DEPARTMENT_OTHER): Payer: No Typology Code available for payment source | Admitting: Lab

## 2012-07-31 DIAGNOSIS — D693 Immune thrombocytopenic purpura: Secondary | ICD-10-CM

## 2012-07-31 LAB — CBC WITH DIFFERENTIAL/PLATELET
BASO%: 0.4 % (ref 0.0–2.0)
EOS%: 1 % (ref 0.0–7.0)
HCT: 42.1 % (ref 34.8–46.6)
MCH: 29.8 pg (ref 25.1–34.0)
MCHC: 33.5 g/dL (ref 31.5–36.0)
MONO#: 0.8 10*3/uL (ref 0.1–0.9)
RBC: 4.73 10*6/uL (ref 3.70–5.45)
RDW: 13.3 % (ref 11.2–14.5)
WBC: 4.9 10*3/uL (ref 3.9–10.3)
lymph#: 1.1 10*3/uL (ref 0.9–3.3)
nRBC: 0 % (ref 0–0)

## 2012-07-31 NOTE — Telephone Encounter (Signed)
Pt came by today and wants appt for 5/5 moved to before May 5th but could not find any MD spot, expalinec to pt that he is full and first opening is end of June, r/s appt to 6/30, then pt asked if i would be calling her regarding labs. expalined to her that lab result will be called in by nurse and if MD ask for another lab draw due to result then schduler will call pt.Nurse notified regarding r/s of appts

## 2012-07-31 NOTE — Telephone Encounter (Signed)
left message gv appt d/t. made pt aware that i will mail letter/cal...td °

## 2012-08-01 ENCOUNTER — Telehealth: Payer: Self-pay | Admitting: *Deleted

## 2012-08-01 NOTE — Telephone Encounter (Signed)
Message copied by Orbie Hurst on Tue Aug 01, 2012  1:59 PM ------      Message from: Levert Feinstein      Created: Mon Jul 31, 2012  7:59 PM       Call pt - platelets 144,000 - within range of previous values but down from 174,000 05/02/12.  Let's repeat a CBC in 1 month   POF done ------

## 2012-08-01 NOTE — Telephone Encounter (Signed)
Spoke with patient.  Let her know platelets were 144K, down from the previous 174K but within the range of previous values.  Dr. Cyndie Chime wants to recheck her labs in one month.  Schedulers should call her today or tomorrow with date and time.  If not, please call for appt.  She appreciated the phone call.

## 2012-08-02 ENCOUNTER — Telehealth: Payer: Self-pay | Admitting: Oncology

## 2012-08-02 NOTE — Telephone Encounter (Signed)
S/w the pt and she is aware of her lab appt in April.

## 2012-08-03 ENCOUNTER — Telehealth: Payer: Self-pay | Admitting: Oncology

## 2012-08-03 NOTE — Telephone Encounter (Signed)
Appt was r/s by pt to June ok per MD

## 2012-09-04 ENCOUNTER — Other Ambulatory Visit (HOSPITAL_BASED_OUTPATIENT_CLINIC_OR_DEPARTMENT_OTHER): Payer: No Typology Code available for payment source | Admitting: Lab

## 2012-09-04 DIAGNOSIS — D693 Immune thrombocytopenic purpura: Secondary | ICD-10-CM

## 2012-09-04 LAB — COMPREHENSIVE METABOLIC PANEL (CC13)
AST: 17 U/L (ref 5–34)
Albumin: 3.5 g/dL (ref 3.5–5.0)
Alkaline Phosphatase: 56 U/L (ref 40–150)
BUN: 13.7 mg/dL (ref 7.0–26.0)
Calcium: 10.2 mg/dL (ref 8.4–10.4)
Chloride: 107 mEq/L (ref 98–107)
Glucose: 85 mg/dl (ref 70–99)
Potassium: 4.5 mEq/L (ref 3.5–5.1)
Sodium: 139 mEq/L (ref 136–145)
Total Protein: 7.1 g/dL (ref 6.4–8.3)

## 2012-09-04 LAB — CBC WITH DIFFERENTIAL/PLATELET
Basophils Absolute: 0 10*3/uL (ref 0.0–0.1)
Eosinophils Absolute: 0 10*3/uL (ref 0.0–0.5)
HGB: 14.8 g/dL (ref 11.6–15.9)
MONO%: 11.8 % (ref 0.0–14.0)
NEUT#: 4 10*3/uL (ref 1.5–6.5)
RBC: 4.83 10*6/uL (ref 3.70–5.45)
RDW: 13.3 % (ref 11.2–14.5)
WBC: 5.8 10*3/uL (ref 3.9–10.3)
lymph#: 1.1 10*3/uL (ref 0.9–3.3)

## 2012-09-07 ENCOUNTER — Telehealth: Payer: Self-pay | Admitting: *Deleted

## 2012-09-07 NOTE — Telephone Encounter (Signed)
Message copied by Sabino Snipes on Thu Sep 07, 2012  1:36 PM ------      Message from: Levert Feinstein      Created: Mon Sep 04, 2012 12:58 PM       Call pt - platelets up to 204.000!  Go figure ------

## 2012-09-07 NOTE — Telephone Encounter (Signed)
Pt notified of platelet results per Dr Cyndie Chime.  She expressed appreciation for call.

## 2012-09-26 ENCOUNTER — Ambulatory Visit: Payer: BC Managed Care – PPO | Admitting: Oncology

## 2012-09-26 ENCOUNTER — Other Ambulatory Visit: Payer: BC Managed Care – PPO | Admitting: Lab

## 2012-10-02 ENCOUNTER — Other Ambulatory Visit: Payer: BC Managed Care – PPO | Admitting: Lab

## 2012-10-02 ENCOUNTER — Ambulatory Visit: Payer: BC Managed Care – PPO | Admitting: Oncology

## 2012-10-09 ENCOUNTER — Encounter (HOSPITAL_COMMUNITY): Payer: Self-pay | Admitting: Pharmacist

## 2012-10-20 ENCOUNTER — Encounter (HOSPITAL_COMMUNITY): Payer: Self-pay

## 2012-10-20 ENCOUNTER — Encounter (HOSPITAL_COMMUNITY)
Admission: RE | Admit: 2012-10-20 | Discharge: 2012-10-20 | Disposition: A | Discharge status: Home / Self Care | Payer: No Typology Code available for payment source | Source: Ambulatory Visit | Attending: Obstetrics and Gynecology | Admitting: Obstetrics and Gynecology

## 2012-10-20 DIAGNOSIS — Z01812 Encounter for preprocedural laboratory examination: Secondary | ICD-10-CM | POA: Insufficient documentation

## 2012-10-20 DIAGNOSIS — Z01818 Encounter for other preprocedural examination: Secondary | ICD-10-CM | POA: Insufficient documentation

## 2012-10-20 LAB — BASIC METABOLIC PANEL
BUN: 13 mg/dL (ref 6–23)
Glucose, Bld: 87 mg/dL (ref 70–99)
Sodium: 136 mEq/L (ref 135–145)

## 2012-10-20 LAB — SURGICAL PCR SCREEN: Staphylococcus aureus: NEGATIVE

## 2012-10-20 LAB — CBC
HCT: 42.1 % (ref 36.0–46.0)
Hemoglobin: 14.1 g/dL (ref 12.0–15.0)
MCH: 30.3 pg (ref 26.0–34.0)
MCHC: 33.5 g/dL (ref 30.0–36.0)

## 2012-10-20 NOTE — Patient Instructions (Addendum)
   Your procedure is scheduled on: Wednesday 10/25/12 Enter through the [employer] [facility] at: 6am  Pick up the phone at the desk and dial [phone] and inform us of your arrival.  Please call this number if you have any problems the morning of surgery: [phone]  Remember: Do not eat or drink after midnight [id number] Please take these medications morning of surgery:  Do not wear jewelry, make-up, or FINGER nail polish No metal in your hair or on your body. Do not wear lotions, powders, perfumes. You may wear deodorant.  Please use your CHG wash as directed prior to surgery.  Do not shave anywhere for at least 12 hours prior to first CHG shower.  Do not bring valuables to the hospital. Contacts, Dentures and Partial Plates may not be worn to OR  Leave suitcase in the car. After Surgery it may be brought to your room.  For patients being admitted to the hospital, checkout time is 11:00am the day of discharge.

## 2012-10-24 ENCOUNTER — Telehealth: Payer: Self-pay | Admitting: *Deleted

## 2012-10-24 NOTE — Telephone Encounter (Signed)
Received call from Dr. Henderson Cloud stating that he is planning a hysterectomy tomorrow on this pt with ITP & knows that platelets have been good lately but wants to know if there are any special recommendations.  Call back # is [phone].

## 2012-10-24 NOTE — H&P (Signed)
[name] is an 37 y.o. female S/P BTL and EMA is having cyclic pain with menses getting progressively worse. Endometriosis noted at L/S. Pertinent Gynecological History: Menses: flow is light Bleeding: N/A Contraception: tubal ligation DES exposure: denies Blood transfusions: none Sexually transmitted diseases: no past history Previous GYN Procedures: DNC and EMA, BTL  Last mammogram: normal Date: 2012 Last pap: normal Date: 2014 OB History: G2, P1   Menstrual History: Menarche age: unknown  No LMP recorded.    Past Medical History  Diagnosis Date  . Aseptic necrosis bone 09/28/2011    Bilateral hips due to steroid Rx of ITP S/P R THR c bone graft 07/20/07  . 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 Leukopenia 09/29/2011  . 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 Chronic ITP (idiopathic thrombocytopenia) 09/28/2011    Dx during pregnancy 12/07-Dr. Cyndie Chime consulting w/Dr. Huntley Dec for this    Past Surgical History  Procedure Laterality Date  . Tubal ligation      Feb 2012  . Cesarean section  2008    No family history on file.  Social History:  reports that she has never smoked. She has never used smokeless tobacco. She reports that  drinks alcohol. She reports that she uses illicit drugs.  Allergies: No Known Allergies  No prescriptions prior to admission    Review of Systems  Constitutional: Negative for fever.  HENT: Negative for nosebleeds.   Respiratory: Negative for hemoptysis.   Gastrointestinal: Negative for blood in stool and melena.    There were no vitals taken for this visit. Physical Exam  Constitutional: She appears well-developed and well-nourished.  Cardiovascular: Normal rate and regular rhythm.   Respiratory: Effort normal and breath sounds normal.  GI: Soft. Bowel sounds are normal. There is no tenderness.  Genitourinary:  Uterus RV , 6 wks size mobile Adnexa NT without masses    No  results found for this or any previous visit (from the past 24 hour(s)).  No results found.  Assessment/Plan: 37 yo G2P1 S/P EMA and BTL with cyclic pain with menses.  For LAVH. Risks reviewed preoperatively. History of ITP with no recent symptoms. Consultation with heme done, no special recommendations.  Dwaine Pringle II,Gates Jividen E 10/24/2012, 5:37 PM

## 2012-10-25 ENCOUNTER — Encounter (HOSPITAL_COMMUNITY): Payer: Self-pay | Admitting: Anesthesiology

## 2012-10-25 ENCOUNTER — Encounter (HOSPITAL_COMMUNITY)
Admission: RE | Disposition: A | Discharge status: Home / Self Care | Payer: Self-pay | Source: Ambulatory Visit | Attending: Obstetrics and Gynecology

## 2012-10-25 ENCOUNTER — Ambulatory Visit (HOSPITAL_COMMUNITY): Payer: No Typology Code available for payment source | Admitting: Anesthesiology

## 2012-10-25 ENCOUNTER — Observation Stay (HOSPITAL_COMMUNITY)
Admission: RE | Admit: 2012-10-25 | Discharge: 2012-10-26 | Disposition: A | Discharge status: Home / Self Care | Payer: No Typology Code available for payment source | Source: Ambulatory Visit | Attending: Obstetrics and Gynecology | Admitting: Obstetrics and Gynecology

## 2012-10-25 DIAGNOSIS — N946 Dysmenorrhea, unspecified: Principal | ICD-10-CM | POA: Insufficient documentation

## 2012-10-25 DIAGNOSIS — Z23 Encounter for immunization: Secondary | ICD-10-CM | POA: Diagnosis not present

## 2012-10-25 DIAGNOSIS — N72 Inflammatory disease of cervix uteri: Secondary | ICD-10-CM | POA: Diagnosis not present

## 2012-10-25 DIAGNOSIS — N83209 Unspecified ovarian cyst, unspecified side: Secondary | ICD-10-CM | POA: Diagnosis not present

## 2012-10-25 HISTORY — PX: LAPAROSCOPIC ASSISTED VAGINAL HYSTERECTOMY: SHX5398

## 2012-10-25 LAB — CBC
HCT: 42.2 % (ref 36.0–46.0)
Hemoglobin: 14 g/dL (ref 12.0–15.0)
MCH: 29.8 pg (ref 26.0–34.0)
MCHC: 33.2 g/dL (ref 30.0–36.0)

## 2012-10-25 SURGERY — HYSTERECTOMY, VAGINAL, LAPAROSCOPY-ASSISTED
Anesthesia: General | Site: Abdomen | Wound class: Clean Contaminated

## 2012-10-25 MED ORDER — HEPARIN SODIUM (PORCINE) 5000 UNIT/ML IJ SOLN
INTRAMUSCULAR | Status: DC | PRN
  Administered 2012-10-25: 5000 [IU]

## 2012-10-25 MED ORDER — ROCURONIUM BROMIDE 50 MG/5ML IV SOLN
INTRAVENOUS | Status: AC
  Filled 2012-10-25: qty 1

## 2012-10-25 MED ORDER — HYDROMORPHONE HCL PF 1 MG/ML IJ SOLN
0.2500 mg | INTRAMUSCULAR | Status: DC | PRN
  Administered 2012-10-25: 0.5 mg via INTRAVENOUS

## 2012-10-25 MED ORDER — ACETAMINOPHEN 10 MG/ML IV SOLN
INTRAVENOUS | Status: DC | PRN
  Administered 2012-10-25: 1000 mg via INTRAVENOUS

## 2012-10-25 MED ORDER — 0.9 % SODIUM CHLORIDE (POUR BTL) OPTIME
TOPICAL | Status: DC | PRN
  Administered 2012-10-25: 1000 mL

## 2012-10-25 MED ORDER — ACETAMINOPHEN 325 MG PO TABS: 650.0000 mg | ORAL_TABLET | ORAL | Status: DC | PRN

## 2012-10-25 MED ORDER — CEFAZOLIN SODIUM-DEXTROSE 2-3 GM-% IV SOLR
2.0000 g | INTRAVENOUS | Status: AC
  Administered 2012-10-25: 2 g via INTRAVENOUS

## 2012-10-25 MED ORDER — LIDOCAINE HCL (CARDIAC) 20 MG/ML IV SOLN
INTRAVENOUS | Status: DC | PRN
  Administered 2012-10-25: 50 mg via INTRAVENOUS

## 2012-10-25 MED ORDER — PROPOFOL 10 MG/ML IV EMUL
INTRAVENOUS | Status: AC
  Filled 2012-10-25: qty 20

## 2012-10-25 MED ORDER — HYDROMORPHONE 0.3 MG/ML IV SOLN
INTRAVENOUS | Status: DC
  Administered 2012-10-25: 2.67 mL via INTRAVENOUS
  Administered 2012-10-25: 1.33 mL via INTRAVENOUS
  Administered 2012-10-25: 11:00:00 via INTRAVENOUS
  Filled 2012-10-25: qty 25

## 2012-10-25 MED ORDER — FENTANYL CITRATE 0.05 MG/ML IJ SOLN
INTRAMUSCULAR | Status: AC
  Filled 2012-10-25: qty 5

## 2012-10-25 MED ORDER — OXYCODONE-ACETAMINOPHEN 5-325 MG PO TABS
1.0000 | ORAL_TABLET | ORAL | Status: DC | PRN
  Administered 2012-10-26 (×3): 1 via ORAL
  Filled 2012-10-25 (×3): qty 1

## 2012-10-25 MED ORDER — SODIUM CHLORIDE 0.9 % IJ SOLN: 9.0000 mL | INTRAMUSCULAR | Status: DC | PRN

## 2012-10-25 MED ORDER — MIDAZOLAM HCL 2 MG/2ML IJ SOLN
INTRAMUSCULAR | Status: AC
  Filled 2012-10-25: qty 2

## 2012-10-25 MED ORDER — NEOSTIGMINE METHYLSULFATE 1 MG/ML IJ SOLN
INTRAMUSCULAR | Status: AC
  Filled 2012-10-25: qty 1

## 2012-10-25 MED ORDER — SPIRONOLACTONE 50 MG PO TABS
75.0000 mg | ORAL_TABLET | Freq: Every day | ORAL | Status: DC
  Administered 2012-10-26: 75 mg via ORAL
  Filled 2012-10-25 (×3): qty 1

## 2012-10-25 MED ORDER — ONDANSETRON HCL 4 MG/2ML IJ SOLN
INTRAMUSCULAR | Status: DC | PRN
  Administered 2012-10-25: 4 mg via INTRAVENOUS

## 2012-10-25 MED ORDER — HYDROMORPHONE HCL PF 1 MG/ML IJ SOLN
INTRAMUSCULAR | Status: AC
  Filled 2012-10-25: qty 1

## 2012-10-25 MED ORDER — ONDANSETRON HCL 4 MG/2ML IJ SOLN
4.0000 mg | Freq: Four times a day (QID) | INTRAMUSCULAR | Status: DC | PRN
  Filled 2012-10-25: qty 2

## 2012-10-25 MED ORDER — DEXAMETHASONE SODIUM PHOSPHATE 10 MG/ML IJ SOLN
INTRAMUSCULAR | Status: AC
  Filled 2012-10-25: qty 1

## 2012-10-25 MED ORDER — DEXAMETHASONE SODIUM PHOSPHATE 4 MG/ML IJ SOLN
INTRAMUSCULAR | Status: DC | PRN
  Administered 2012-10-25: 8 mg via INTRAVENOUS

## 2012-10-25 MED ORDER — CEFAZOLIN SODIUM-DEXTROSE 2-3 GM-% IV SOLR
INTRAVENOUS | Status: AC
  Filled 2012-10-25: qty 50

## 2012-10-25 MED ORDER — DIPHENHYDRAMINE HCL 50 MG/ML IJ SOLN: 12.5000 mg | Freq: Four times a day (QID) | INTRAMUSCULAR | Status: DC | PRN

## 2012-10-25 MED ORDER — LACTATED RINGERS IR SOLN
Status: DC | PRN
  Administered 2012-10-25: 3000 mL

## 2012-10-25 MED ORDER — BUPIVACAINE HCL (PF) 0.5 % IJ SOLN
INTRAMUSCULAR | Status: AC
  Filled 2012-10-25: qty 30

## 2012-10-25 MED ORDER — ACETAMINOPHEN 10 MG/ML IV SOLN: 1000.0000 mg | Freq: Once | INTRAVENOUS | Status: DC | PRN

## 2012-10-25 MED ORDER — ACETAMINOPHEN 10 MG/ML IV SOLN
INTRAVENOUS | Status: AC
  Filled 2012-10-25: qty 100

## 2012-10-25 MED ORDER — PROMETHAZINE HCL 25 MG/ML IJ SOLN: 6.2500 mg | INTRAMUSCULAR | Status: DC | PRN

## 2012-10-25 MED ORDER — MIDAZOLAM HCL 5 MG/5ML IJ SOLN
INTRAMUSCULAR | Status: DC | PRN
  Administered 2012-10-25: 2 mg via INTRAVENOUS

## 2012-10-25 MED ORDER — ONDANSETRON HCL 4 MG PO TABS: 4.0000 mg | ORAL_TABLET | Freq: Four times a day (QID) | ORAL | Status: DC | PRN

## 2012-10-25 MED ORDER — LIDOCAINE HCL (CARDIAC) 20 MG/ML IV SOLN
INTRAVENOUS | Status: AC
  Filled 2012-10-25: qty 5

## 2012-10-25 MED ORDER — BUPIVACAINE HCL (PF) 0.5 % IJ SOLN
INTRAMUSCULAR | Status: DC | PRN
  Administered 2012-10-25: 30 mL

## 2012-10-25 MED ORDER — SENNOSIDES-DOCUSATE SODIUM 8.6-50 MG PO TABS: 1.0000 | ORAL_TABLET | Freq: Every evening | ORAL | Status: DC | PRN

## 2012-10-25 MED ORDER — HYDROMORPHONE HCL PF 1 MG/ML IJ SOLN
INTRAMUSCULAR | Status: DC | PRN
  Administered 2012-10-25 (×2): 0.5 mg via INTRAVENOUS

## 2012-10-25 MED ORDER — ONDANSETRON HCL 4 MG/2ML IJ SOLN
4.0000 mg | Freq: Four times a day (QID) | INTRAMUSCULAR | Status: DC | PRN
  Administered 2012-10-25: 4 mg via INTRAVENOUS

## 2012-10-25 MED ORDER — MEPERIDINE HCL 25 MG/ML IJ SOLN: 6.2500 mg | INTRAMUSCULAR | Status: DC | PRN

## 2012-10-25 MED ORDER — LACTATED RINGERS IV SOLN
INTRAVENOUS | Status: DC
  Administered 2012-10-25: 07:00:00 via INTRAVENOUS

## 2012-10-25 MED ORDER — MENTHOL 3 MG MT LOZG: 1.0000 | LOZENGE | OROMUCOSAL | Status: DC | PRN

## 2012-10-25 MED ORDER — LACTATED RINGERS IV SOLN
INTRAVENOUS | Status: DC
  Administered 2012-10-25 (×2): via INTRAVENOUS

## 2012-10-25 MED ORDER — DIPHENHYDRAMINE HCL 12.5 MG/5ML PO ELIX
12.5000 mg | ORAL_SOLUTION | Freq: Four times a day (QID) | ORAL | Status: DC | PRN
  Filled 2012-10-25: qty 5

## 2012-10-25 MED ORDER — ALUM & MAG HYDROXIDE-SIMETH 200-200-20 MG/5ML PO SUSP: 30.0000 mL | ORAL | Status: DC | PRN

## 2012-10-25 MED ORDER — FENTANYL CITRATE 0.05 MG/ML IJ SOLN
INTRAMUSCULAR | Status: DC | PRN
  Administered 2012-10-25: 50 ug via INTRAVENOUS
  Administered 2012-10-25 (×2): 100 ug via INTRAVENOUS

## 2012-10-25 MED ORDER — ONDANSETRON HCL 4 MG/2ML IJ SOLN
INTRAMUSCULAR | Status: AC
  Filled 2012-10-25: qty 2

## 2012-10-25 MED ORDER — ZOLPIDEM TARTRATE 5 MG PO TABS: 5.0000 mg | ORAL_TABLET | Freq: Every evening | ORAL | Status: DC | PRN

## 2012-10-25 MED ORDER — GLYCOPYRROLATE 0.2 MG/ML IJ SOLN
INTRAMUSCULAR | Status: DC | PRN
  Administered 2012-10-25: 0.2 mg via INTRAVENOUS
  Administered 2012-10-25: 0.4 mg via INTRAVENOUS

## 2012-10-25 MED ORDER — PROPOFOL 10 MG/ML IV BOLUS
INTRAVENOUS | Status: DC | PRN
  Administered 2012-10-25: 150 mg via INTRAVENOUS

## 2012-10-25 MED ORDER — SODIUM CHLORIDE 0.9 % IJ SOLN
INTRAMUSCULAR | Status: DC | PRN
  Administered 2012-10-25: 10 mL via INTRAVENOUS

## 2012-10-25 MED ORDER — HYDROMORPHONE HCL PF 1 MG/ML IJ SOLN
INTRAMUSCULAR | Status: AC
  Administered 2012-10-25: 0.5 mg via INTRAVENOUS
  Filled 2012-10-25: qty 1

## 2012-10-25 MED ORDER — NALOXONE HCL 0.4 MG/ML IJ SOLN: 0.4000 mg | INTRAMUSCULAR | Status: DC | PRN

## 2012-10-25 MED ORDER — GLYCOPYRROLATE 0.2 MG/ML IJ SOLN
INTRAMUSCULAR | Status: AC
  Filled 2012-10-25: qty 3

## 2012-10-25 MED ORDER — ROCURONIUM BROMIDE 100 MG/10ML IV SOLN
INTRAVENOUS | Status: DC | PRN
  Administered 2012-10-25: 10 mg via INTRAVENOUS
  Administered 2012-10-25: 40 mg via INTRAVENOUS

## 2012-10-25 MED ORDER — NEOSTIGMINE METHYLSULFATE 1 MG/ML IJ SOLN
INTRAMUSCULAR | Status: DC | PRN
Start: 2012-10-25 — End: 2012-10-25
  Administered 2012-10-25: 2 mg via INTRAVENOUS

## 2012-10-25 SURGICAL SUPPLY — 41 items
ADH SKN CLS APL DERMABOND .7 (GAUZE/BANDAGES/DRESSINGS) ×1
CABLE HIGH FREQUENCY MONO STRZ (ELECTRODE) IMPLANT
CATH ROBINSON RED A/P 16FR (CATHETERS) ×1 IMPLANT
CLOTH BEACON ORANGE TIMEOUT ST (SAFETY) ×2 IMPLANT
CONT PATH 16OZ SNAP LID 3702 (MISCELLANEOUS) ×2 IMPLANT
COVER TABLE BACK 60X90 (DRAPES) ×2 IMPLANT
DECANTER SPIKE VIAL GLASS SM (MISCELLANEOUS) ×1 IMPLANT
DERMABOND ADVANCED (GAUZE/BANDAGES/DRESSINGS) ×1
DERMABOND ADVANCED .7 DNX12 (GAUZE/BANDAGES/DRESSINGS) ×1 IMPLANT
ELECT LIGASURE LONG (ELECTRODE) ×1 IMPLANT
ELECT LIGASURE SHORT 9 REUSE (ELECTRODE) IMPLANT
ELECT REM PT RETURN 9FT ADLT (ELECTROSURGICAL) ×2
ELECTRODE REM PT RTRN 9FT ADLT (ELECTROSURGICAL) IMPLANT
FILTER SMOKE EVAC LAPAROSHD (FILTER) ×2 IMPLANT
GLOVE BIO SURGEON STRL SZ8 (GLOVE) ×6 IMPLANT
GLOVE BIOGEL PI IND STRL 6.5 (GLOVE) ×1 IMPLANT
GLOVE BIOGEL PI INDICATOR 6.5 (GLOVE) ×1
GOWN STRL REIN XL XLG (GOWN DISPOSABLE) ×8 IMPLANT
NEEDLE INSUFFLATION 120MM (ENDOMECHANICALS) ×2 IMPLANT
NS IRRIG 1000ML POUR BTL (IV SOLUTION) ×2 IMPLANT
PACK LAVH (CUSTOM PROCEDURE TRAY) ×2 IMPLANT
PROTECTOR NERVE ULNAR (MISCELLANEOUS) ×2 IMPLANT
SCISSORS LAP 5X45 EPIX DISP (ENDOMECHANICALS) IMPLANT
SEALER TISSUE G2 CVD JAW 45CM (ENDOMECHANICALS) ×2 IMPLANT
SET IRRIG TUBING LAPAROSCOPIC (IRRIGATION / IRRIGATOR) ×1 IMPLANT
STRIP CLOSURE SKIN 1/4X3 (GAUZE/BANDAGES/DRESSINGS) IMPLANT
SUT MNCRL 0 MO-4 VIOLET 18 CR (SUTURE) ×3 IMPLANT
SUT MNCRL 0 VIOLET 6X18 (SUTURE) ×1 IMPLANT
SUT MNCRL AB 0 CT1 27 (SUTURE) IMPLANT
SUT MONOCRYL 0 6X18 (SUTURE) ×1
SUT MONOCRYL 0 MO 4 18  CR/8 (SUTURE) ×3
SUT VIC AB 4-0 PS2 27 (SUTURE) ×2 IMPLANT
SUT VICRYL 0 UR6 27IN ABS (SUTURE) IMPLANT
SYR 30ML LL (SYRINGE) ×2 IMPLANT
SYR 5ML LL (SYRINGE) ×2 IMPLANT
TOWEL OR 17X24 6PK STRL BLUE (TOWEL DISPOSABLE) ×4 IMPLANT
TRAY FOLEY CATH 14FR (SET/KITS/TRAYS/PACK) ×2 IMPLANT
TROCAR XCEL NON-BLD 11X100MML (ENDOMECHANICALS) ×2 IMPLANT
TROCAR XCEL NON-BLD 5MMX100MML (ENDOMECHANICALS) ×2 IMPLANT
WARMER LAPAROSCOPE (MISCELLANEOUS) ×2 IMPLANT
WATER STERILE IRR 1000ML POUR (IV SOLUTION) ×2 IMPLANT

## 2012-10-25 NOTE — Anesthesia Postprocedure Evaluation (Signed)
  Anesthesia Post-op Note  Patient: [name]  Procedure(s) Performed: Procedure(s) with comments: LAPAROSCOPIC ASSISTED VAGINAL HYSTERECTOMY (N/A) - with aspiration of left ovarian cyst Patient is awake and responsive. Pain and nausea are reasonably well controlled. Vital signs are stable and clinically acceptable. Oxygen saturation is clinically acceptable. There are no apparent anesthetic complications at this time. Patient is ready for discharge.

## 2012-10-25 NOTE — Anesthesia Preprocedure Evaluation (Signed)
Anesthesia Evaluation  Patient identified by MRN, date of birth, ID band Patient awake    Reviewed: Allergy & Precautions, H&P , NPO status , Patient's Chart, lab work & pertinent test results  Airway Mallampati: I TM Distance: >3 FB Neck ROM: full    Dental no notable dental hx.    Pulmonary neg pulmonary ROS,    Pulmonary exam normal       Cardiovascular negative cardio ROS      Neuro/Psych negative neurological ROS  negative psych ROS   GI/Hepatic negative GI ROS, Neg liver ROS,   Endo/Other  negative endocrine ROS  Renal/GU negative Renal ROS  negative genitourinary   Musculoskeletal   Abdominal Normal abdominal exam  (+)   Peds negative pediatric ROS (+)  Hematology ITP   Anesthesia Other Findings   Reproductive/Obstetrics negative OB ROS                           Anesthesia Physical Anesthesia Plan  ASA: II  Anesthesia Plan: General   Post-op Pain Management:    Induction: Intravenous  Airway Management Planned: Oral ETT  Additional Equipment:   Intra-op Plan:   Post-operative Plan: Extubation in OR  Informed Consent: I have reviewed the patients History and Physical, chart, labs and discussed the procedure including the risks, benefits and alternatives for the proposed anesthesia with the patient or authorized representative who has indicated his/her understanding and acceptance.     Plan Discussed with: CRNA and Surgeon  Anesthesia Plan Comments:         Anesthesia Quick Evaluation

## 2012-10-25 NOTE — Progress Notes (Signed)
Good pain relief, tolerating liquids  VSS Afeb Lungs CTA Cor RRR  Abd soft, good BS x 4 Incisions OK Ext PAS on UO clear  A: Stable P: Per Orders

## 2012-10-25 NOTE — Brief Op Note (Signed)
10/25/2012  9:06 AM  PATIENT:  [name]  37 y.o. female  PRE-OPERATIVE DIAGNOSIS:  severe dysmenorrhea cpt 58550  POST-OPERATIVE DIAGNOSIS:  severe dysmenorrhea  PROCEDURE:  Procedure(s) with comments: LAPAROSCOPIC ASSISTED VAGINAL HYSTERECTOMY (N/A) - with aspiration of left ovarian cyst  SURGEON:  Surgeon(s) and Role:    * Leslie Andrea, MD - Primary    * Meriel Pica, MD - Assisting  PHYSICIAN ASSISTANT:   ASSISTANTS: Richarda Overlie MD   ANESTHESIA:   general  EBL:  Total I/O In: 2000 [I.V.:2000] Out: 400 [Urine:100; Blood:300]  BLOOD ADMINISTERED:none  DRAINS: Urinary Catheter (Foley)   LOCAL MEDICATIONS USED:  MARCAINE     SPECIMEN:  Source of Specimen:  uterus  DISPOSITION OF SPECIMEN:  PATHOLOGY  COUNTS:  YES  TOURNIQUET:  * No tourniquets in log *  DICTATION: .Other Dictation: Dictation Number [id number]  PLAN OF CARE: Admit for overnight observation  PATIENT DISPOSITION:  PACU - hemodynamically stable.   Delay start of Pharmacological VTE agent (>24hrs) due to surgical blood loss or risk of bleeding: not applicable

## 2012-10-25 NOTE — Transfer of Care (Signed)
Immediate Anesthesia Transfer of Care Note  Patient: [name]  Procedure(s) Performed: Procedure(s) with comments: LAPAROSCOPIC ASSISTED VAGINAL HYSTERECTOMY (N/A) - with aspiration of left ovarian cyst  Patient Location: PACU  Anesthesia Type:General  Level of Consciousness: awake, alert  and oriented  Airway & Oxygen Therapy: Patient Spontanous Breathing and Patient connected to nasal cannula oxygen  Post-op Assessment: Report given to PACU RN and Post -op Vital signs reviewed and stable  Post vital signs: Reviewed and stable  Complications: No apparent anesthesia complications

## 2012-10-25 NOTE — Anesthesia Postprocedure Evaluation (Signed)
  Anesthesia Post-op Note  Patient: [name]  Procedure(s) Performed: Procedure(s) with comments: LAPAROSCOPIC ASSISTED VAGINAL HYSTERECTOMY (N/A) - with aspiration of left ovarian cyst  Patient Location: Women's unit  Anesthesia Type:General  Level of Consciousness: awake, alert  and oriented  Airway and Oxygen Therapy: Patient Spontanous Breathing and Patient connected to nasal cannula oxygen  Post-op Pain: none  Post-op Assessment: Post-op Vital signs reviewed and Patient's Cardiovascular Status Stable  Post-op Vital Signs: Reviewed and stable  Complications: No apparent anesthesia complications

## 2012-10-25 NOTE — Progress Notes (Signed)
Reviewed with patient procedure No changes to H&P per patient history

## 2012-10-26 ENCOUNTER — Encounter (HOSPITAL_COMMUNITY): Payer: Self-pay | Admitting: Obstetrics and Gynecology

## 2012-10-26 ENCOUNTER — Telehealth: Payer: Self-pay | Admitting: *Deleted

## 2012-10-26 DIAGNOSIS — N946 Dysmenorrhea, unspecified: Secondary | ICD-10-CM | POA: Diagnosis not present

## 2012-10-26 LAB — CBC
MCHC: 33.6 g/dL (ref 30.0–36.0)
Platelets: 186 10*3/uL (ref 150–400)
RDW: 13.6 % (ref 11.5–15.5)
WBC: 10.6 10*3/uL — ABNORMAL HIGH (ref 4.0–10.5)

## 2012-10-26 MED ORDER — OXYCODONE-ACETAMINOPHEN 5-325 MG PO TABS: 1.0000 | ORAL_TABLET | Freq: Four times a day (QID) | ORAL | Status: DC | PRN

## 2012-10-26 MED ORDER — PNEUMOCOCCAL VAC POLYVALENT 25 MCG/0.5ML IJ INJ: 0.5000 mL | INJECTION | INTRAMUSCULAR | Status: DC

## 2012-10-26 MED ORDER — PNEUMOCOCCAL VAC POLYVALENT 25 MCG/0.5ML IJ INJ
0.5000 mL | INJECTION | Freq: Once | INTRAMUSCULAR | Status: AC
  Administered 2012-10-26: 0.5 mL via INTRAMUSCULAR
  Filled 2012-10-26: qty 0.5

## 2012-10-26 MED FILL — Heparin Sodium (Porcine) Inj 5000 Unit/ML: INTRAMUSCULAR | Qty: 1 | Status: AC

## 2012-10-26 NOTE — Op Note (Signed)
NAMEADRIONA, [name]              ACCOUNT NO.:  [id number]  MEDICAL RECORD NO.:  [id number]  LOCATION:  9318                          FACILITY:  [facility]  PHYSICIAN:  Guy Sandifer. Henderson Cloud, M.D. DATE OF BIRTH:  [birth year]  DATE OF PROCEDURE:  10/25/2012 DATE OF DISCHARGE:                              OPERATIVE REPORT   PREOPERATIVE DIAGNOSIS:  Dysmenorrhea.  POSTOPERATIVE DIAGNOSIS:  Dysmenorrhea.  PROCEDURE:  Laparoscopically assisted vaginal hysterectomy and aspiration of left ovarian cyst.  SURGEON:  Guy Sandifer. Henderson Cloud, M.D.  ANESTHESIA:  General with endotracheal intubation, Belva Agee, M.D.  ESTIMATED BLOOD LOSS:  300 mL.  SPECIMENS: 1. Uterus. 2. Aspirate of left ovarian cyst, both to pathology.  ASSISTANT:  Duke Salvia. Marcelle Overlie, M.D.  INDICATIONS AND CONSENT:  This patient is a 37 year old married white female, G2, P2, status post tubal ligation, status post endometrial ablation with increasingly severe, painful menses.  Attempts at medical management, have not been successful.  After discussion of options, she was taken to the operating room for laparoscopically assisted vaginal hysterectomy.  Potential risks and complications have been discussed preoperatively including but not limited to infection, organ damage, bleeding requiring transfusion of blood products with HIV and hepatitis acquisition, DVT, PE, pneumonia, fistula formation, laparotomy, pelvic pain, painful intercourse.  The patient also has a history of mild idiopathic thrombocytopenic purpura.  Recently her platelet counts have been good.  Consultation with Hematology preoperatively was being carried out, with no special recommendations made at that time.  Consent is signed and on the chart.  FINDINGS:  Upper abdomen is grossly normal.  Uterus is about 6 weeks in size.  Fallopian tubes had Filshie clips bilaterally, but otherwise are normal.  Right ovary is normal.  Left ovary has a 2-3 cm  smooth translucent cyst.  Anterior and posterior cul-de-sacs were normal.  DESCRIPTION OF PROCEDURE:  The patient was taken to the operating room, where she was identified, placed in dorsal supine position, and general anesthesia was induced via endotracheal intubation.  She was placed in dorsal lithotomy position.  Time-out was undertaken.  She was prepped abdominally and vaginally.  Bladder straight catheterized.  Anterior cervical lip was grasped with single-tooth tenaculum.  An acorn tenaculum was placed in the cervix as well as a manipulator.  She was draped in a sterile fashion.  The infraumbilical and suprapubic areas were injected in the midline with approximately 6 mL of 0.25% plain Marcaine.  Small infraumbilical incision was made.  A disposable Veress needle was placed on the first attempt without difficulty.  Good syringe and drop test were noted.  Two liters of gas were then insufflated under low pressure with good tympany in the right upper quadrant.  Veress needle was removed, and a 10/11 XL bladeless disposable trocar sleeve was placed using direct visualization with the diagnostic laparoscope. After placement, the operative laparoscope was used.  A small suprapubic incision was made in the midline, and a 5-mm XL bladeless disposable trocar sleeve was placed under direct visualization without difficulty. The above findings were noted.  The left ovarian cyst was aspirated for straw-colored fluid, sent to pathology.  Good hemostasis on the ovary was noted.  The proximal ligaments  of the uterus were taken down bilaterally with the EnSeal bipolar cautery cutting instrument to the level of the vesicouterine peritoneum.  Vesicouterine peritoneum was then taken down cephalad laterally.  Good hemostasis was noted. Suprapubic trocar sleeve was removed.  Instruments were removed and attention was turned to the vagina.  Posterior cul-de-sacs entered sharply and the cervix was  circumscribed with unipolar cautery.  Mucosa was advanced sharply and bluntly.  Using the handheld LigaSure bipolar cautery.  Progressive bites were taken of the uterosacral ligaments, the bladder pillars, cardinal ligaments, and uterine vessels bilaterally. Anterior cul-de-sac was entered without difficulty.  Specimens delivered posteriorly.  Ligaments were taken down and the specimen was delivered from the field.  All suture was 0 Monocryl unless otherwise designated, 0 Monocryl sutures were then used to plicate the uterosacral ligaments to the vaginal cuff bilaterally and then in the midline with a third suture.  Cuff was closed with figure-of-eights.  Foley catheter was placed in the bladder and clear urine was noted.  Attention was returned to the abdomen.  Pneumoperitoneum was reintroduced and the suprapubic trocar sleeve was reintroduced under direct visualization.  Irrigation was carried out.  Inspection under reduced pneumoperitoneum reveals excellent hemostasis all around.  The remaining approximately 24 mL of 0.25% plain Marcaine was then placed in the peritoneal cavity. Pneumoperitoneum was reduced.  All instruments and trocar sleeves were removed.  Skin incisions were closed with interrupted 2-0 Vicryl suture. Dermabond was placed on both of the incisions.  All counts were correct. The patient was awakened and taken to the recovery room in stable condition.     Guy Sandifer Henderson Cloud, M.D.     JET/MEDQ  D:  10/25/2012  T:  10/26/2012  Job:  [id number]

## 2012-10-26 NOTE — Progress Notes (Signed)
Pt education was discussed and handout was given . Pt had no questions and ambulated out of unit with spouse.

## 2012-10-26 NOTE — Discharge Summary (Signed)
Physician Discharge Summary  Patient ID: PAETYN PIETRZAK MRN: [id number] DOB/AGE: [birth year] 37 y.o.  Admit date: 10/25/2012 Discharge date: 10/26/2012  Admission Diagnoses:Dysmenorhea  Discharge Diagnoses:  Active Problems:   * No active hospital problems. *   Discharged Condition: good  Hospital Course: admitted for surgery. Good resumption of bowel function, good pain relief, ambulating, tolerating regular diet.  Consults: None  Significant Diagnostic Studies: labs:  Results for orders placed during the hospital encounter of 10/25/12 (from the past 24 hour(s))  CBC     Status: Abnormal   Collection Time    10/26/12  5:29 AM      Result Value Range   WBC 10.6 (*) 4.0 - 10.5 K/uL   RBC 4.11  3.87 - 5.11 MIL/uL   Hemoglobin 12.3  12.0 - 15.0 g/dL   HCT 56.2  13.0 - 86.5 %   MCV 89.1  78.0 - 100.0 fL   MCH 29.9  26.0 - 34.0 pg   MCHC 33.6  30.0 - 36.0 g/dL   RDW 78.4  69.6 - 29.5 %   Platelets 186  150 - 400 K/uL    Treatments: IV hydration and surgery: LAVH  Discharge Exam: Blood pressure 105/66, pulse 68, temperature 97.9 F (36.6 C), temperature source Oral, resp. rate 16, height 5\' 5"  (1.651 m), weight 120 lb (54.432 kg), SpO2 100.00%. General appearance: alert, cooperative and no distress Resp: clear to auscultation bilaterally GI: soft, non-tender; bowel sounds normal; no masses,  no organomegaly  Disposition: 01-Home or Self Care   Future Appointments Provider Department Dept Phone   11/27/2012 10:30 AM Dava Najjar Idelle Jo [facility] [phone]   11/27/2012 11:00 AM Levert Feinstein, MD [facility] [phone]       Medication List    TAKE these medications       cetirizine 5 MG tablet  Commonly known as:  ZYRTEC  Take 5 mg by mouth daily.     oxyCODONE-acetaminophen 5-325 MG per tablet  Commonly known as:  PERCOCET/ROXICET  Take 1-2 tablets by mouth every 6 (six) hours as needed.      spironolactone 25 MG tablet  Commonly known as:  ALDACTONE  Take 75 mg by mouth daily.           Follow-up Information   Follow up In 2 weeks.      Signed: Retta Mac E 10/26/2012, 1:44 PM

## 2012-10-26 NOTE — Telephone Encounter (Signed)
Late Entry:  VM message left for Dr Henderson Cloud @ [phone] that there are no special recommendations per Dr Truett Perna on 10/24/12.

## 2012-10-26 NOTE — Progress Notes (Signed)
Feels good, ready to go home Ambulating, tolerating regular diet, passing flatus, good pain relief  VSS Afeb Abd flat, good BS, incisions OK  A: satisfactory P: D/C home       Instructions given

## 2012-10-30 LAB — TYPE AND SCREEN
ABO/RH(D): O POS
Unit division: 0
Unit division: 0

## 2012-11-27 ENCOUNTER — Ambulatory Visit (HOSPITAL_BASED_OUTPATIENT_CLINIC_OR_DEPARTMENT_OTHER): Payer: No Typology Code available for payment source | Admitting: Oncology

## 2012-11-27 ENCOUNTER — Other Ambulatory Visit (HOSPITAL_BASED_OUTPATIENT_CLINIC_OR_DEPARTMENT_OTHER): Payer: No Typology Code available for payment source | Admitting: Lab

## 2012-11-27 ENCOUNTER — Telehealth: Payer: Self-pay | Admitting: Oncology

## 2012-11-27 VITALS — BP 110/72 | HR 72 | Temp 99.1°F | Resp 20 | Ht 65.0 in | Wt 120.1 lb

## 2012-11-27 DIAGNOSIS — D693 Immune thrombocytopenic purpura: Secondary | ICD-10-CM

## 2012-11-27 DIAGNOSIS — D72819 Decreased white blood cell count, unspecified: Secondary | ICD-10-CM

## 2012-11-27 DIAGNOSIS — M8700 Idiopathic aseptic necrosis of unspecified bone: Secondary | ICD-10-CM

## 2012-11-27 DIAGNOSIS — D72821 Monocytosis (symptomatic): Secondary | ICD-10-CM

## 2012-11-27 LAB — CBC WITH DIFFERENTIAL/PLATELET
Basophils Absolute: 0 10*3/uL (ref 0.0–0.1)
EOS%: 1.1 % (ref 0.0–7.0)
Eosinophils Absolute: 0.1 10*3/uL (ref 0.0–0.5)
HGB: 13.5 g/dL (ref 11.6–15.9)
MCV: 90.5 fL (ref 79.5–101.0)
MONO%: 11.8 % (ref 0.0–14.0)
NEUT#: 3.4 10*3/uL (ref 1.5–6.5)
RBC: 4.52 10*6/uL (ref 3.70–5.45)
RDW: 13.4 % (ref 11.2–14.5)
lymph#: 1.3 10*3/uL (ref 0.9–3.3)
nRBC: 0 % (ref 0–0)

## 2012-11-27 NOTE — Progress Notes (Signed)
Hematology and Oncology Follow Up Visit  [name] [id number] [birth year] 37 y.o. 11/27/2012 11:50 AM   Principle Diagnosis: Encounter Diagnoses  Name Primary?  . Chronic ITP (idiopathic thrombocytopenia) Yes  . Leukopenia   . Chronic idiopathic monocytosis   . 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 She was treated with a combination of anti-D immunoglobulin and steroids and she achieved a near-complete response. She developed aseptic necrosis of her hips due to the steroid use and had to undergo a right hip replacement at [facility] in February 2009. Left hip has been stable on followup exams and has not required surgery.  We have monitored her blood counts closely through this office. Platelet count has been consistently over 100,000 off all treatment now for about 5 years. Counts fluctuate and over the last year have ranged from a low of 144,000 recorded on March 14 to 204,000 recorded in April 2014.  Today's count is 190,000. She has been leukopenic is in the past but more recently white blood counts have been in the normal range. She has had unexplained monocytosis up to 20% of the white count differential in the past but more recently monocytes have been stable at 12-15%.  She has had no interim medical problems. She is on no chronic medications. She took a leave of absence from work to devote extra time to her special needs child. her daughter is now 35 years old. She just adopted twin boys originally from [place] who are 8-1/39 years old and extremely energetic.  She's had some intermittent problems with inflammation of her conjunctivae. She saw her eye doctor but no specific diagnosis was made. He thought perhaps this was related to her underlying immune dysfunction. He did not suggest any specific treatment. I told her to ask him about Restasis eyedrops.  (Cyclosporin).     Medications: reviewed  Allergies: No Known Allergies  Review of Systems: Constitutional:    no constitutional symptoms  Respiratory: no cough or dyspnea  Cardiovascular:   no chest pain or palpitations  Gastrointestinal: No abdominal pain or change in bowel habit Genito-Urinary: no urinary tract symptoms  Musculoskeletal: minimal discomfort right hip status post previous hip replacement. No chronic pain in her left hip despite changes of avascular necrosis on that side as well  Neurologic: No headache or change in vision. Conjunctivae travel irritation as noted above. Skin: no rash or ecchymosis  Remaining ROS negative.  Physical Exam: Blood pressure 110/72, pulse 72, temperature 99.1 F (37.3 C), temperature source Oral, resp. rate 20, height 5\' 5"  (1.651 m), weight 120 lb 1.6 oz (54.477 kg). Wt Readings from Last 3 Encounters:  11/27/12 120 lb 1.6 oz (54.477 kg)  10/25/12 120 lb (54.432 kg)  10/25/12 120 lb (54.432 kg)     General appearance: Well-nourished Caucasian woman  HENNT:  Pharynx no erythema or exudate. Mild injection of conjunctivae left greater than right Lymph nodes: No adenopathy  Breasts: Lungs: Clear to auscultation resonant to percussion Heart: regular rhythm no murmur  Abdomen: soft, nontender, no mass, no organomegaly  Extremities: no edema, no calf tenderness  Musculoskeletal: No joint deformities GU: Vascular: no cyanosis Neurologic: mental status intact, PERRLA,  Skin: no rash or ecchymosis   Lab Results: Lab Results  Component Value Date   WBC 5.4 11/27/2012   HGB 13.5 11/27/2012   HCT 40.9  11/27/2012   MCV 90.5 11/27/2012   PLT 190 11/27/2012     Chemistry      Component Value Date/Time   NA 136 10/20/2012 0945   NA 139 09/04/2012 1018   K 4.3 10/20/2012 0945   K 4.5 09/04/2012 1018   CL 104 10/20/2012 0945   CL 107 09/04/2012 1018   CO2 26 10/20/2012 0945   CO2 25 09/04/2012 1018   BUN 13 10/20/2012 0945   BUN 13.7 09/04/2012  1018   CREATININE 0.77 10/20/2012 0945   CREATININE 0.8 09/04/2012 1018      Component Value Date/Time   CALCIUM 9.9 10/20/2012 0945   CALCIUM 10.2 09/04/2012 1018   ALKPHOS 56 09/04/2012 1018   ALKPHOS 54 11/02/2011 0955   AST 17 09/04/2012 1018   AST 14 11/02/2011 0955   ALT 7 09/04/2012 1018   ALT <8 11/02/2011 0955   BILITOT 0.42 09/04/2012 1018   BILITOT 0.5 11/02/2011 0955       Radiological Studies: No results found.  Impression: #1. Chronic ITP Stable remission with platelet count over 100,000 now for over 5 years. We discussed today that although the course of ITP can be unpredictable and it may flare up again in the future, the fact that she has had stable platelet counts for so long is certainly a good sign. Infections can be  stress on the immune system which may trigger a transient fall in the platelet count. With respect to the intermittent problems, she is having with her eyes, it not clear whether this is an immune phenomenon related to the ITP but as noted above, I told her to ask her ophthalmologist about Restasis eyedrops and whether or not they would be useful. I'm going to decrease lab checks to every 4 months and see her back again in one year.  #2. Idiopathic monocytosis Stable and improved over time  #3. Chronic leukopenia. Likely also related to immune dysfunction but white counts now falling in the normal range more often than not.  #4. 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 Left hip stable at this point and no surgery is planned.     CC:.  Dr. Jeanmarie Hubert   Levert Feinstein, MD 6/30/201411:50 AM

## 2012-11-27 NOTE — Telephone Encounter (Signed)
Gave pt appt for for labs on October, February and June 2015

## 2013-03-26 ENCOUNTER — Encounter (INDEPENDENT_AMBULATORY_CARE_PROVIDER_SITE_OTHER): Payer: Self-pay

## 2013-03-26 ENCOUNTER — Other Ambulatory Visit (HOSPITAL_BASED_OUTPATIENT_CLINIC_OR_DEPARTMENT_OTHER): Payer: No Typology Code available for payment source

## 2013-03-26 DIAGNOSIS — D693 Immune thrombocytopenic purpura: Secondary | ICD-10-CM

## 2013-03-26 DIAGNOSIS — D72821 Monocytosis (symptomatic): Secondary | ICD-10-CM

## 2013-03-26 DIAGNOSIS — D72819 Decreased white blood cell count, unspecified: Secondary | ICD-10-CM

## 2013-03-26 LAB — CBC WITH DIFFERENTIAL/PLATELET
BASO%: 0.3 % (ref 0.0–2.0)
Eosinophils Absolute: 0 10*3/uL (ref 0.0–0.5)
MCHC: 33.8 g/dL (ref 31.5–36.0)
MONO#: 1 10*3/uL — ABNORMAL HIGH (ref 0.1–0.9)
NEUT#: 4.6 10*3/uL (ref 1.5–6.5)
Platelets: 189 10*3/uL (ref 145–400)
RBC: 4.86 10*6/uL (ref 3.70–5.45)
WBC: 6.6 10*3/uL (ref 3.9–10.3)
lymph#: 0.9 10*3/uL (ref 0.9–3.3)
nRBC: 0 % (ref 0–0)

## 2013-04-02 ENCOUNTER — Ambulatory Visit: Payer: No Typology Code available for payment source | Admitting: Internal Medicine

## 2013-04-10 ENCOUNTER — Telehealth: Payer: Self-pay | Admitting: *Deleted

## 2013-04-10 NOTE — Telephone Encounter (Signed)
Message copied by Gala Romney on Tue Apr 10, 2013  4:11 PM ------      Message from: Levert Feinstein      Created: Mon Mar 26, 2013  3:00 PM       Call pt platelets 189,000 ------

## 2013-04-10 NOTE — Telephone Encounter (Signed)
Per MD; left vm platelets normal @ 189,000 and if any questions regarding information to call office.

## 2013-07-23 ENCOUNTER — Other Ambulatory Visit (HOSPITAL_BASED_OUTPATIENT_CLINIC_OR_DEPARTMENT_OTHER): Payer: No Typology Code available for payment source

## 2013-07-23 DIAGNOSIS — D72819 Decreased white blood cell count, unspecified: Secondary | ICD-10-CM

## 2013-07-23 DIAGNOSIS — D693 Immune thrombocytopenic purpura: Secondary | ICD-10-CM

## 2013-07-23 DIAGNOSIS — D72821 Monocytosis (symptomatic): Secondary | ICD-10-CM

## 2013-07-23 LAB — CBC WITH DIFFERENTIAL/PLATELET
BASO%: 0.3 % (ref 0.0–2.0)
Basophils Absolute: 0 10*3/uL (ref 0.0–0.1)
EOS%: 0.3 % (ref 0.0–7.0)
Eosinophils Absolute: 0 10*3/uL (ref 0.0–0.5)
HCT: 42.2 % (ref 34.8–46.6)
HGB: 14.2 g/dL (ref 11.6–15.9)
LYMPH%: 16.5 % (ref 14.0–49.7)
MCH: 30.1 pg (ref 25.1–34.0)
MCHC: 33.6 g/dL (ref 31.5–36.0)
MCV: 89.6 fL (ref 79.5–101.0)
MONO#: 0.9 10*3/uL (ref 0.1–0.9)
MONO%: 14.3 % — AB (ref 0.0–14.0)
NEUT#: 4.5 10*3/uL (ref 1.5–6.5)
NEUT%: 68.6 % (ref 38.4–76.8)
NRBC: 0 % (ref 0–0)
PLATELETS: 180 10*3/uL (ref 145–400)
RBC: 4.71 10*6/uL (ref 3.70–5.45)
RDW: 13.5 % (ref 11.2–14.5)
WBC: 6.5 10*3/uL (ref 3.9–10.3)
lymph#: 1.1 10*3/uL (ref 0.9–3.3)

## 2013-07-25 ENCOUNTER — Telehealth: Payer: Self-pay | Admitting: *Deleted

## 2013-07-25 NOTE — Telephone Encounter (Signed)
Called and informed patient of stable platelets at 180,000.  Per Dr. [name].  Patient verbalized understanding.

## 2013-07-25 NOTE — Telephone Encounter (Signed)
Message copied by Norma Fredrickson on Wed Jul 25, 2013  4:46 PM ------      Message from: Ignacia Felling      Created: Wed Jul 25, 2013  4:35 PM                   ----- Message -----         From: Annia Belt, MD         Sent: 07/25/2013   8:22 AM           To: Ignacia Felling, RN, Jesse Fall, RN, #            Call pt: platelets stable @ 180,000 ------

## 2013-07-29 ENCOUNTER — Encounter: Payer: Self-pay | Admitting: Oncology

## 2013-11-26 ENCOUNTER — Encounter: Payer: Self-pay | Admitting: Oncology

## 2013-11-26 ENCOUNTER — Other Ambulatory Visit: Payer: No Typology Code available for payment source

## 2013-11-26 ENCOUNTER — Ambulatory Visit (INDEPENDENT_AMBULATORY_CARE_PROVIDER_SITE_OTHER): Payer: No Typology Code available for payment source | Admitting: Oncology

## 2013-11-26 ENCOUNTER — Ambulatory Visit: Payer: No Typology Code available for payment source | Admitting: Oncology

## 2013-11-26 ENCOUNTER — Other Ambulatory Visit (INDEPENDENT_AMBULATORY_CARE_PROVIDER_SITE_OTHER): Payer: No Typology Code available for payment source

## 2013-11-26 VITALS — BP 94/69 | HR 67 | Temp 97.5°F | Ht 65.0 in | Wt 125.0 lb

## 2013-11-26 DIAGNOSIS — Z96649 Presence of unspecified artificial hip joint: Secondary | ICD-10-CM

## 2013-11-26 DIAGNOSIS — D72821 Monocytosis (symptomatic): Secondary | ICD-10-CM

## 2013-11-26 DIAGNOSIS — D72819 Decreased white blood cell count, unspecified: Secondary | ICD-10-CM

## 2013-11-26 DIAGNOSIS — D693 Immune thrombocytopenic purpura: Secondary | ICD-10-CM

## 2013-11-26 DIAGNOSIS — M8708 Idiopathic aseptic necrosis of bone, other site: Secondary | ICD-10-CM

## 2013-11-26 LAB — CBC WITH DIFFERENTIAL/PLATELET
BASOS ABS: 0 10*3/uL (ref 0.0–0.1)
BASOS PCT: 0 % (ref 0–1)
EOS ABS: 0 10*3/uL (ref 0.0–0.7)
Eosinophils Relative: 1 % (ref 0–5)
HCT: 42.4 % (ref 36.0–46.0)
Hemoglobin: 13.8 g/dL (ref 12.0–15.0)
Lymphocytes Relative: 29 % (ref 12–46)
Lymphs Abs: 1.1 10*3/uL (ref 0.7–4.0)
MCH: 30.4 pg (ref 26.0–34.0)
MCHC: 32.5 g/dL (ref 30.0–36.0)
MCV: 93.4 fL (ref 78.0–100.0)
MONO ABS: 0.5 10*3/uL (ref 0.1–1.0)
Monocytes Relative: 13 % — ABNORMAL HIGH (ref 3–12)
NEUTROS ABS: 2.1 10*3/uL (ref 1.7–7.7)
NEUTROS PCT: 57 % (ref 43–77)
Platelets: 201 10*3/uL (ref 150–400)
RBC: 4.54 MIL/uL (ref 3.87–5.11)
RDW: 13.6 % (ref 11.5–15.5)
WBC: 3.7 10*3/uL — ABNORMAL LOW (ref 4.0–10.5)

## 2013-11-26 NOTE — Patient Instructions (Signed)
Lab every 6 months Visit with Dr Darnell Level in 1 year:  11/26/14

## 2013-11-26 NOTE — Progress Notes (Signed)
Hematology and Oncology Follow Up Visit  [name] [id number] Jun 03, 1975 38 y.o. 11/26/2013 12:06 PM   Principle Diagnosis: Encounter Diagnosis  Name Primary?  . 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 She was treated with a combination of anti-D immunoglobulin and steroids and she achieved a near-complete response. 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 Her left hip has been stable on followup exams and has not required surgery.   We have monitored her blood counts closely through this office. Platelet count has been consistently over 100,000 off all treatment now for about 5 years except for a rare count that dropped into the 80,000 range.  She has been leukopenic is in the past but more recently white blood counts have been in the normal range.  She has had unexplained monocytosis up to 20% of the white count differential in the past but more recently monocytes have been stable at 12-15%.   She has had no interim medical problems. No bleeding or bruising. Since her last visit, she has had a partial hysterectomy and aspiration of a ovarian cyst in May 2014.  She is now taking Aldactone which seems to be working better than anything else for chronic acne.   She continues  to devote extra time to her special needs child. her daughter is now 1 years old. She also adopted twin boys originally from [place] who are 25-1/40 years old and extremely energetic. Her daughter accompanies her today. She has had refractory petit mal seizures and has been on multiple different medications without much improvement but is now showing a response to ethosuximide. If this is not effective, she may have to have brain surgery. 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   Medications: reviewed  Allergies: No Known Allergies  Review of Systems: Negative except as noted above   Physical Exam: Blood pressure 94/69, pulse 67, temperature 97.5 F (36.4 C), temperature source Oral, height 5\' 5"  (1.651 m), weight 125 lb (56.7 kg), SpO2 100.00%. Wt Readings from Last 3 Encounters:  11/26/13 125 lb (56.7 kg)  11/27/12 120 lb 1.6 oz (54.477 kg)  10/25/12 120 lb (54.432 kg)     General appearance: Well-nourished Caucasian woman HENNT: Pharynx no erythema, exudate, mass, or ulcer. No thyromegaly or thyroid nodules Lymph nodes: No cervical, supraclavicular, or axillary lymphadenopathy Breasts:  Lungs: Clear to auscultation, resonant to percussion throughout Heart: Regular rhythm, no murmur, no gallop, no rub, no click, no edema Abdomen: Soft, nontender, normal bowel sounds, no mass, no organomegaly Extremities: No edema, no calf tenderness Musculoskeletal: no joint deformities GU:  Vascular: Carotid pulses 2+, no bruits,  Neurologic: Alert, oriented, PERRLA,  cranial nerves grossly normal,  Skin: No rash or ecchymosis  Lab Results: CBC W/Diff    Component Value Date/Time   WBC 3.7* 11/26/2013 1012   WBC 6.5 07/23/2013 0933   RBC 4.54 11/26/2013 1012   RBC 4.71 07/23/2013 0933   HGB 13.8 11/26/2013 1012   HGB 14.2 07/23/2013 0933   HCT 42.4 11/26/2013 1012   HCT 42.2 07/23/2013 0933   PLT 201 11/26/2013 1012   PLT 180 07/23/2013 0933   MCV 93.4 11/26/2013  1012   MCV 89.6 07/23/2013 0933   MCH 30.4 11/26/2013 1012   MCH 30.1 07/23/2013 0933   MCHC 32.5 11/26/2013 1012   MCHC 33.6 07/23/2013 0933   RDW 13.6 11/26/2013 1012   RDW 13.5 07/23/2013 0933   LYMPHSABS 1.1 11/26/2013 1012   LYMPHSABS 1.1 07/23/2013 0933   MONOABS 0.5 11/26/2013 1012   MONOABS 0.9 07/23/2013 0933   EOSABS 0.0 11/26/2013 1012   EOSABS 0.0 07/23/2013 0933   BASOSABS 0.0 11/26/2013 1012   BASOSABS 0.0  07/23/2013 0933     Chemistry      Component Value Date/Time   NA 136 10/20/2012 0945   NA 139 09/04/2012 1018   K 4.3 10/20/2012 0945   K 4.5 09/04/2012 1018   CL 104 10/20/2012 0945   CL 107 09/04/2012 1018   CO2 26 10/20/2012 0945   CO2 25 09/04/2012 1018   BUN 13 10/20/2012 0945   BUN 13.7 09/04/2012 1018   CREATININE 0.77 10/20/2012 0945   CREATININE 0.8 09/04/2012 1018      Component Value Date/Time   CALCIUM 9.9 10/20/2012 0945   CALCIUM 10.2 09/04/2012 1018   ALKPHOS 56 09/04/2012 1018   ALKPHOS 54 11/02/2011 0955   AST 17 09/04/2012 1018   AST 14 11/02/2011 0955   ALT 7 09/04/2012 1018   ALT <8 11/02/2011 0955   BILITOT 0.42 09/04/2012 1018   BILITOT 0.5 11/02/2011 0955    Platelet count today 201,000    Impression:  #1. Chronic ITP. She remains in remission off all treatment for many years now. I feel comfortable decreasing frequency of lab analysis to every 6 months.  #2. Aseptic necrosis both hips secondary to steroids used to treat her I TPN the past.  #3. Status post partial right hip replacement secondary to #2.  #4. Intermittent leukopenia with mild monocytosis without associated anemia. White count 3700 today with 57% neutrophils, 29% lymphocytes, 13% monocytes.   CC: Patient Care Team: Gaynelle Arabian, MD as PCP - General (Family Medicine)   Annia Belt, MD 6/29/201512:06 PM

## 2013-11-27 ENCOUNTER — Telehealth: Payer: Self-pay | Admitting: *Deleted

## 2013-11-27 NOTE — Telephone Encounter (Signed)
Message copied by Ebbie Latus on Tue Nov 27, 2013  3:11 PM ------      Message from: Annia Belt      Created: Mon Nov 26, 2013 12:38 PM       Call pt: platelet count 201,000.  We are good to decrease lab monitoring to every 6 months. ------

## 2013-11-27 NOTE — Telephone Encounter (Signed)
Called pt - pt informed platelet count of 201,000 and decrease labs to every 6 months. Pt voiced understanding.

## 2014-01-21 ENCOUNTER — Telehealth: Payer: Self-pay | Admitting: *Deleted

## 2014-01-21 NOTE — Telephone Encounter (Signed)
Incoming call - pt states she's thinking about having a breast lift w/ possible implants; wants to know if there's any contraindication with her dx of ITP. She's aware Dr [name] is on vacation; will wait for his response when he returns.

## 2014-01-21 NOTE — Telephone Encounter (Signed)
Should not be a problem  Just make sure we check a CBC before the surgery to make sure platelets still OK

## 2014-01-24 NOTE — Telephone Encounter (Signed)
Stated she let u know if/when she decide on the surgery and schedule the blood work.

## 2014-08-20 ENCOUNTER — Other Ambulatory Visit: Payer: Self-pay | Admitting: Obstetrics and Gynecology

## 2014-08-21 LAB — CYTOLOGY - PAP

## 2014-11-04 ENCOUNTER — Encounter: Payer: No Typology Code available for payment source | Admitting: Oncology

## 2014-11-11 ENCOUNTER — Ambulatory Visit (INDEPENDENT_AMBULATORY_CARE_PROVIDER_SITE_OTHER): Payer: 59 | Admitting: Oncology

## 2014-11-11 VITALS — BP 101/70 | HR 79 | Temp 98.9°F | Ht 65.0 in | Wt 124.1 lb

## 2014-11-11 DIAGNOSIS — D693 Immune thrombocytopenic purpura: Secondary | ICD-10-CM

## 2014-11-11 DIAGNOSIS — D72821 Monocytosis (symptomatic): Secondary | ICD-10-CM

## 2014-11-11 DIAGNOSIS — D72819 Decreased white blood cell count, unspecified: Secondary | ICD-10-CM

## 2014-11-11 LAB — CBC WITH DIFFERENTIAL/PLATELET
BASOS ABS: 0 10*3/uL (ref 0.0–0.1)
Basophils Relative: 0 % (ref 0–1)
EOS ABS: 0 10*3/uL (ref 0.0–0.7)
Eosinophils Relative: 1 % (ref 0–5)
HCT: 42.5 % (ref 36.0–46.0)
Hemoglobin: 14.3 g/dL (ref 12.0–15.0)
LYMPHS PCT: 31 % (ref 12–46)
Lymphs Abs: 1.3 10*3/uL (ref 0.7–4.0)
MCH: 30.2 pg (ref 26.0–34.0)
MCHC: 33.6 g/dL (ref 30.0–36.0)
MCV: 89.7 fL (ref 78.0–100.0)
MONOS PCT: 19 % — AB (ref 3–12)
Monocytes Absolute: 0.8 10*3/uL (ref 0.1–1.0)
NEUTROS ABS: 2.1 10*3/uL (ref 1.7–7.7)
NEUTROS PCT: 49 % (ref 43–77)
PLATELETS: 205 10*3/uL (ref 150–400)
RBC: 4.74 MIL/uL (ref 3.87–5.11)
RDW: 13.2 % (ref 11.5–15.5)
WBC: 4.3 10*3/uL (ref 4.0–10.5)

## 2014-11-11 NOTE — Progress Notes (Signed)
Patient ID: [name], female   DOB: [birth year], 39 y.o.   MRN: [id number] Hematology and Oncology Follow Up Visit  DENNETTE FAULCONER [id number] 30-Jun-1975 39 y.o. 11/11/2014 6:21 PM   Principle Diagnosis: Encounter Diagnoses  Name Primary?  . Chronic ITP (idiopathic thrombocytopenia) Yes  . Leukopenia   . 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 She was treated with a combination of anti-D immunoglobulin and steroids and she achieved a near-complete response. 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 Her left hip has been stable on followup exams and has not required surgery.   Platelet count has been consistently over 100,000 off all treatment now for about 8 years except for a rare count that drops into the 80,000 range.  She has been leukopenic is in the past but recently white blood counts have been in the normal range.  She has had unexplained monocytosis up to 20% of the white count differential.     Interim History:   She has had no interim medical problem. No bleeding or bruising. Right hip gets uncomfortable if she is on her feet for a long time. Left hip remains asymptomatic. She is still spending a large amount of time with her handicapped daughter. 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  Medications: reviewed  Allergies: No Known Allergies  Review of Systems: See history of present illness. Remaining ROS negative:   Physical Exam: Blood pressure 101/70, pulse 79, temperature 98.9 F (37.2 C), temperature source Oral, height 5\' 5"  (1.651 m), weight 124 lb 1.6 oz (56.291 kg), last menstrual period 10/16/2012, SpO2 100  %. Wt Readings from Last 3 Encounters:  11/11/14 124 lb 1.6 oz (56.291 kg)  11/26/13 125 lb (56.7 kg)  11/27/12 120 lb 1.6 oz (54.477 kg)     General appearance: Well-nourished Caucasian woman HENNT: Pharynx no erythema, exudate, mass, or ulcer. No thyromegaly or thyroid nodules Lymph nodes: No cervical, supraclavicular, or axillary lymphadenopathy Breasts: Lungs: Clear to auscultation, resonant to percussion throughout Heart: Regular rhythm, no murmur, no gallop, no rub, no click, no edema Abdomen: Soft, nontender, normal bowel sounds, no mass, no organomegaly Extremities: No edema, no calf tenderness Musculoskeletal: no joint deformities GU:  Vascular:  Neurologic: Alert, oriented, PERRLA, , cranial nerves grossly normal, motor strength 5 over 5, reflexes 1+ symmetric,  Skin: No rash or ecchymosis  Lab Results: CBC W/Diff    Component Value Date/Time   WBC 4.3 11/11/2014 1523   WBC 6.5 07/23/2013 0933   RBC 4.74 11/11/2014 1523   RBC 4.71 07/23/2013 0933   HGB 14.3 11/11/2014 1523   HGB 14.2 07/23/2013 0933   HCT 42.5 11/11/2014 1523   HCT 42.2 07/23/2013 0933   PLT 205 11/11/2014 1523   PLT 180 07/23/2013 0933   MCV 89.7 11/11/2014 1523   MCV 89.6 07/23/2013 0933   MCH 30.2 11/11/2014 1523   MCH 30.1 07/23/2013 0933   MCHC 33.6 11/11/2014 1523   MCHC 33.6 07/23/2013 0933   RDW 13.2 11/11/2014 1523   RDW 13.5 07/23/2013 0933   LYMPHSABS 1.3 11/11/2014 1523   LYMPHSABS 1.1 07/23/2013 0933   MONOABS 0.8 11/11/2014  1523   MONOABS 0.9 07/23/2013 0933   EOSABS 0.0 11/11/2014 1523   EOSABS 0.0 07/23/2013 0933   BASOSABS 0.0 11/11/2014 1523   BASOSABS 0.0 07/23/2013 0933     Chemistry      Component Value Date/Time   NA 136 10/20/2012 0945   NA 139 09/04/2012 1018   K 4.3 10/20/2012 0945   K 4.5 09/04/2012 1018   CL 104 10/20/2012 0945   CL 107 09/04/2012 1018   CO2 26 10/20/2012 0945   CO2 25 09/04/2012 1018   BUN 13 10/20/2012 0945   BUN 13.7 09/04/2012  1018   CREATININE 0.77 10/20/2012 0945   CREATININE 0.8 09/04/2012 1018      Component Value Date/Time   CALCIUM 9.9 10/20/2012 0945   CALCIUM 10.2 09/04/2012 1018   ALKPHOS 56 09/04/2012 1018   ALKPHOS 54 11/02/2011 0955   AST 17 09/04/2012 1018   AST 14 11/02/2011 0955   ALT 7 09/04/2012 1018   ALT <8 11/02/2011 0955   BILITOT 0.42 09/04/2012 1018   BILITOT 0.5 11/02/2011 0955       Radiological Studies: No results found.  Impression:  #1. ITP in remission.  #2. Idiopathic monocytosis  #3. Intermittent leukopenia  I will see her again in one year. She will call for any interim problems.  CC: Patient Care Team: Gaynelle Arabian, MD as PCP - General (Family Medicine)   Annia Belt, MD 6/13/20166:21 PM

## 2014-11-11 NOTE — Patient Instructions (Signed)
Return 1 year Lab day of visit

## 2014-11-14 ENCOUNTER — Telehealth: Payer: Self-pay | Admitting: *Deleted

## 2014-11-14 NOTE — Telephone Encounter (Signed)
Pt called - no answer; left message platelet count great @ 205,000 per Dr [name]. And to call if she any questions.

## 2014-11-14 NOTE — Telephone Encounter (Signed)
-----   Message from Annia Belt, MD sent at 11/12/2014  7:22 AM EDT ----- Call pt: platelets great @ 205,000

## 2015-09-19 DIAGNOSIS — Z1231 Encounter for screening mammogram for malignant neoplasm of breast: Secondary | ICD-10-CM | POA: Diagnosis not present

## 2015-09-19 DIAGNOSIS — Z6821 Body mass index (BMI) 21.0-21.9, adult: Secondary | ICD-10-CM | POA: Diagnosis not present

## 2015-09-19 DIAGNOSIS — Z01419 Encounter for gynecological examination (general) (routine) without abnormal findings: Secondary | ICD-10-CM | POA: Diagnosis not present

## 2015-11-12 ENCOUNTER — Emergency Department (HOSPITAL_COMMUNITY): Payer: BLUE CROSS/BLUE SHIELD

## 2015-11-12 ENCOUNTER — Encounter (HOSPITAL_COMMUNITY): Payer: Self-pay

## 2015-11-12 ENCOUNTER — Emergency Department (HOSPITAL_COMMUNITY)
Admission: EM | Admit: 2015-11-12 | Discharge: 2015-11-12 | Disposition: A | Discharge status: Home / Self Care | Payer: BLUE CROSS/BLUE SHIELD | Attending: Emergency Medicine | Admitting: Emergency Medicine

## 2015-11-12 DIAGNOSIS — Y92009 Unspecified place in unspecified non-institutional (private) residence as the place of occurrence of the external cause: Secondary | ICD-10-CM | POA: Diagnosis not present

## 2015-11-12 DIAGNOSIS — Y9389 Activity, other specified: Secondary | ICD-10-CM | POA: Insufficient documentation

## 2015-11-12 DIAGNOSIS — S61409A Unspecified open wound of unspecified hand, initial encounter: Secondary | ICD-10-CM | POA: Diagnosis not present

## 2015-11-12 DIAGNOSIS — W25XXXA Contact with sharp glass, initial encounter: Secondary | ICD-10-CM | POA: Insufficient documentation

## 2015-11-12 DIAGNOSIS — Z79899 Other long term (current) drug therapy: Secondary | ICD-10-CM | POA: Insufficient documentation

## 2015-11-12 DIAGNOSIS — S61511A Laceration without foreign body of right wrist, initial encounter: Secondary | ICD-10-CM | POA: Diagnosis not present

## 2015-11-12 DIAGNOSIS — Y999 Unspecified external cause status: Secondary | ICD-10-CM | POA: Diagnosis not present

## 2015-11-12 DIAGNOSIS — S61011A Laceration without foreign body of right thumb without damage to nail, initial encounter: Secondary | ICD-10-CM

## 2015-11-12 DIAGNOSIS — S66921A Laceration of unspecified muscle, fascia and tendon at wrist and hand level, right hand, initial encounter: Secondary | ICD-10-CM | POA: Diagnosis not present

## 2015-11-12 DIAGNOSIS — S61111A Laceration without foreign body of right thumb with damage to nail, initial encounter: Secondary | ICD-10-CM | POA: Diagnosis not present

## 2015-11-12 DIAGNOSIS — T148 Other injury of unspecified body region: Secondary | ICD-10-CM | POA: Diagnosis not present

## 2015-11-12 MED ORDER — TETANUS-DIPHTH-ACELL PERTUSSIS 5-2.5-18.5 LF-MCG/0.5 IM SUSP
0.5000 mL | Freq: Once | INTRAMUSCULAR | Status: AC
  Administered 2015-11-12: 0.5 mL via INTRAMUSCULAR
  Filled 2015-11-12: qty 0.5

## 2015-11-12 MED ORDER — BACITRACIN ZINC 500 UNIT/GM EX OINT
TOPICAL_OINTMENT | Freq: Every day | CUTANEOUS | Status: DC
  Administered 2015-11-12: 1 via TOPICAL

## 2015-11-12 MED ORDER — BACITRACIN ZINC 500 UNIT/GM EX OINT: 1.0000 "application " | TOPICAL_OINTMENT | Freq: Two times a day (BID) | CUTANEOUS | Status: DC

## 2015-11-12 MED ORDER — BACITRACIN ZINC 500 UNIT/GM EX OINT
TOPICAL_OINTMENT | CUTANEOUS | Status: AC
  Administered 2015-11-12: 1 via TOPICAL
  Filled 2015-11-12: qty 0.9

## 2015-11-12 MED ORDER — NAPROXEN 250 MG PO TABS: 250.0000 mg | ORAL_TABLET | Freq: Two times a day (BID) | ORAL | Status: DC

## 2015-11-12 MED ORDER — LIDOCAINE HCL 2 % IJ SOLN
15.0000 mL | Freq: Once | INTRAMUSCULAR | Status: AC
  Administered 2015-11-12: 300 mg via INTRADERMAL
  Filled 2015-11-12: qty 20

## 2015-11-12 NOTE — ED Provider Notes (Signed)
CSN: [id number]     Arrival date & time 11/12/15  1338 History   First MD Initiated Contact with Patient 11/12/15 1546     Chief Complaint  Patient presents with  . Extremity Laceration    [name] is a 40 y.o. female who Presents to the emergency department complaining of a laceration to her right hand sustained just prior to arrival. Patient reports she accidentally put her hand through a single pane glass window to her house door. She has a laceration to her right thumb and right wrist. She complains of moderate pain there. She has had nothing for treatment today. She is unsure when her last tetanus shot was. She denies any numbness, tingling or weakness.  The history is provided by the patient. No language interpreter was used.    Past Medical History  Diagnosis Date  . 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 Leukopenia 09/29/2011  . 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 Chronic ITP (idiopathic thrombocytopenia) ([facility]) 09/28/2011    Dx during pregnancy 12/07-Dr. [name] consulting w/Dr. Gertie Fey for this   Past Surgical History  Procedure Laterality Date  . Tubal ligation      Feb 2012  . Cesarean section  2008  . Laparoscopic assisted vaginal hysterectomy N/A 10/25/2012    Procedure: LAPAROSCOPIC ASSISTED VAGINAL HYSTERECTOMY;  Surgeon: Allena Katz, MD;  Location: [facility];  Service: Gynecology;  Laterality: N/A;  with aspiration of left ovarian cyst   History reviewed. No pertinent family history. Social History  Substance Use Topics  . Smoking status: Never Smoker   . Smokeless tobacco: Never Used  . Alcohol Use: Yes     Comment: occasionally   OB History    No data available     Review of Systems  Constitutional: Negative for fever.  Skin: Positive for wound. Negative for rash.  Neurological:  Negative for weakness and numbness.      Allergies  Review of patient's allergies indicates no known allergies.  Home Medications   Prior to Admission medications   Medication Sig Start Date End Date Taking? Authorizing Provider  spironolactone (ALDACTONE) 25 MG tablet Take 75 mg by mouth daily.   Yes Historical Provider, MD  bacitracin ointment Apply 1 application topically 2 (two) times daily. 11/12/15   Waynetta Pean, PA-C  naproxen (NAPROSYN) 250 MG tablet Take 1 tablet (250 mg total) by mouth 2 (two) times daily with a meal. 11/12/15   Waynetta Pean, PA-C   BP 104/65 mmHg  Pulse 75  Temp(Src) 97.8 F (36.6 C) (Oral)  Resp 16  Ht 5\' 5"  (1.651 m)  Wt 56.7 kg  BMI 20.80 kg/m2  SpO2 100%  LMP 10/16/2012 Physical Exam  Constitutional: She appears well-developed and well-nourished. No distress.  HENT:  Head: Normocephalic and atraumatic.  Eyes: Right eye exhibits no discharge. Left eye exhibits no discharge.  Cardiovascular: Normal rate, regular rhythm and intact distal pulses.   Bilateral radial pulses are intact. Good capillary refill to her right distal fingertips.  Pulmonary/Chest: Effort normal. No respiratory distress.  Musculoskeletal: Normal range of motion. She exhibits tenderness. She exhibits no edema.  There is a 3 cm irregular laceration to the palmar aspect of her right thumb and a 9 cm irregular laceration to the medial aspect  of her right wrist. There appears to be a tendon avulsion without transection with good ROM and strength of wrist to confrontation. Picture attached.   Neurological: She is alert. Coordination normal.  No numbness or weakness surrounding or distal to the wound.   Skin: Skin is warm and dry. No rash noted. She is not diaphoretic. No erythema. No pallor.  See musk.   Psychiatric: She has a normal mood and affect. Her behavior is normal.  Nursing note and vitals reviewed.     ED Course  .Marland KitchenLaceration Repair Date/Time: 11/12/2015 4:00  PM Performed by: Waynetta Pean Authorized by: Waynetta Pean Consent: Verbal consent obtained. Risks and benefits: risks, benefits and alternatives were discussed Consent given by: patient Patient understanding: patient states understanding of the procedure being performed Patient consent: the patient's understanding of the procedure matches consent given Procedure consent: procedure consent matches procedure scheduled Relevant documents: relevant documents present and verified Test results: test results available and properly labeled Site marked: the operative site was marked Imaging studies: imaging studies available Required items: required blood products, implants, devices, and special equipment available Patient identity confirmed: verbally with patient Time out: Immediately prior to procedure a "time out" was called to verify the correct patient, procedure, equipment, support staff and site/side marked as required. Body area: upper extremity Location details: right wrist Laceration length: 9 cm Foreign bodies: no foreign bodies Tendon involvement: superficial Nerve involvement: none Vascular damage: no Anesthesia: local infiltration Local anesthetic: lidocaine 2% without epinephrine Anesthetic total: 3 ml Patient sedated: no Preparation: Patient was prepped and draped in the usual sterile fashion. Irrigation solution: saline Irrigation method: jet lavage Amount of cleaning: extensive Debridement: none Degree of undermining: none Skin closure: 5-0 Prolene Tendon closure: 5-0 Monocryl Number of sutures: 9 Technique: simple Approximation: close Approximation difficulty: complex Dressing: 4x4 sterile gauze, non-adhesive packing strip and antibiotic ointment Patient tolerance: Patient tolerated the procedure well with no immediate complications Comments: One stitch was used to tac down superficial tendon abrasion. 8 5-0 proline sutures used to close skin.   Marland Kitchen.Laceration  Repair Date/Time: 11/12/2015 5:00 PM Performed by: Waynetta Pean Authorized by: Waynetta Pean Consent: Verbal consent obtained. Risks and benefits: risks, benefits and alternatives were discussed Consent given by: patient Patient understanding: patient states understanding of the procedure being performed Patient consent: the patient's understanding of the procedure matches consent given Procedure consent: procedure consent matches procedure scheduled Relevant documents: relevant documents present and verified Test results: test results available and properly labeled Site marked: the operative site was marked Imaging studies: imaging studies available Required items: required blood products, implants, devices, and special equipment available Patient identity confirmed: verbally with patient Time out: Immediately prior to procedure a "time out" was called to verify the correct patient, procedure, equipment, support staff and site/side marked as required. Body area: upper extremity Location details: right thumb Laceration length: 3 cm Foreign bodies: no foreign bodies Tendon involvement: none Nerve involvement: none Vascular damage: no Anesthesia: digital block Anesthetic total: 3 ml Patient sedated: no Preparation: Patient was prepped and draped in the usual sterile fashion. Irrigation solution: saline Irrigation method: jet lavage Amount of cleaning: extensive Debridement: none Degree of undermining: none Skin closure: 5-0 Prolene Number of sutures: 3 Technique: simple Approximation: close Approximation difficulty: simple Dressing: non-adhesive packing strip and antibiotic ointment Patient tolerance: Patient tolerated the procedure well with no immediate complications   (including critical care time) Labs Review Labs Reviewed - No data to display  Imaging Review Dg Hand Complete Right  11/12/2015  CLINICAL DATA:  Ulnar sided wrist and thumb laceration due to glass.  EXAM: RIGHT HAND - COMPLETE 3+ VIEW COMPARISON:  None. FINDINGS: Initial encounter. Bandage over the thumb. No opaque foreign body or fracture. No dislocation. IMPRESSION: No opaque foreign body or fracture. Electronically Signed   By: Monte Fantasia M.D.   On: 11/12/2015 14:08   I have personally reviewed and evaluated these images as part of my medical decision-making.   EKG Interpretation None     Filed Vitals:   11/12/15 1341 11/12/15 1344  BP:  104/65  Pulse:  75  Temp:  97.8 F (36.6 C)  TempSrc:  Oral  Resp:  16  Height:  5\' 5"  (1.651 m)  Weight:  56.7 kg  SpO2: 100% 100%    MDM   Meds given in ED:  Medications  lidocaine (XYLOCAINE) 2 % (with pres) injection 300 mg (not administered)  bacitracin ointment (not administered)  bacitracin 500 UNIT/GM ointment (not administered)  Tdap (BOOSTRIX) injection 0.5 mL (0.5 mLs Intramuscular Given 11/12/15 1734)    New Prescriptions   BACITRACIN OINTMENT    Apply 1 application topically 2 (two) times daily.   NAPROXEN (NAPROSYN) 250 MG TABLET    Take 1 tablet (250 mg total) by mouth 2 (two) times daily with a meal.    Final diagnoses:  Wrist laceration, right, initial encounter  Thumb laceration, right, initial encounter   This is a 40 y.o. female who Presents to the emergency department complaining of a laceration to her right hand sustained just prior to arrival. Patient reports she accidentally put her hand through a single pane glass window to her house door. She has a laceration to her right thumb and right wrist. On exam the patient has a 9 cm irregular laceration to her right wrist. There appears to be a tendon avulsion without transection with good ROM and strength of wrist to confrontation. She is neurovascularly intact. There is also a 3 cm irregular laceration to her right thumb. Both of these lacerations repaired by me intolerable by the patient. The partial tendon avulsion was tacked down. X-ray shows no foreign body  or fracture. We discussed laceration care. We'll have the patient follow up with hand surgeon Dr. Aline Brochure for follow up. I advised the patient to follow-up with their primary care provider this week. I advised the patient to return to the emergency department with new or worsening symptoms or new concerns. The patient verbalized understanding and agreement with plan.   This patient was discussed with and evaluated by Dr. Laneta Simmers who agrees with assessment and plan.   Waynetta Pean, PA-C 11/12/15 1744  Leo Grosser, MD 11/12/15 2002

## 2015-11-12 NOTE — Discharge Instructions (Signed)

## 2015-11-12 NOTE — ED Notes (Signed)
Discharge instructions, follow up care, and rx x2 reviewed with patient. Patient verbalized understanding. 

## 2015-11-12 NOTE — ED Notes (Signed)
Per EMS[place].  Pt states she was trying to get into her car with her keys but got distracted and accidentally put her hand through her car window.  Rt and 2 in lac to thumb and 2 in lac to other side of hand.  Bleeding controlled.  Wrapped by EMS.

## 2015-11-14 DIAGNOSIS — S61511A Laceration without foreign body of right wrist, initial encounter: Secondary | ICD-10-CM | POA: Diagnosis not present

## 2015-11-14 DIAGNOSIS — S61011A Laceration without foreign body of right thumb without damage to nail, initial encounter: Secondary | ICD-10-CM | POA: Diagnosis not present

## 2015-11-24 DIAGNOSIS — S61511D Laceration without foreign body of right wrist, subsequent encounter: Secondary | ICD-10-CM | POA: Diagnosis not present

## 2015-11-24 DIAGNOSIS — S61011D Laceration without foreign body of right thumb without damage to nail, subsequent encounter: Secondary | ICD-10-CM | POA: Diagnosis not present

## 2015-11-27 DIAGNOSIS — L821 Other seborrheic keratosis: Secondary | ICD-10-CM | POA: Diagnosis not present

## 2015-11-27 DIAGNOSIS — L905 Scar conditions and fibrosis of skin: Secondary | ICD-10-CM | POA: Diagnosis not present

## 2016-01-28 DIAGNOSIS — Z86018 Personal history of other benign neoplasm: Secondary | ICD-10-CM | POA: Diagnosis not present

## 2016-01-28 DIAGNOSIS — D224 Melanocytic nevi of scalp and neck: Secondary | ICD-10-CM | POA: Diagnosis not present

## 2016-01-28 DIAGNOSIS — D2271 Melanocytic nevi of right lower limb, including hip: Secondary | ICD-10-CM | POA: Diagnosis not present

## 2016-01-28 DIAGNOSIS — Z85828 Personal history of other malignant neoplasm of skin: Secondary | ICD-10-CM | POA: Diagnosis not present

## 2016-03-25 DIAGNOSIS — Z23 Encounter for immunization: Secondary | ICD-10-CM | POA: Diagnosis not present

## 2016-05-11 DIAGNOSIS — J019 Acute sinusitis, unspecified: Secondary | ICD-10-CM | POA: Diagnosis not present

## 2016-07-28 DIAGNOSIS — F411 Generalized anxiety disorder: Secondary | ICD-10-CM | POA: Diagnosis not present

## 2016-08-24 DIAGNOSIS — F411 Generalized anxiety disorder: Secondary | ICD-10-CM | POA: Diagnosis not present

## 2016-09-09 DIAGNOSIS — F411 Generalized anxiety disorder: Secondary | ICD-10-CM | POA: Diagnosis not present

## 2016-09-20 DIAGNOSIS — Z1231 Encounter for screening mammogram for malignant neoplasm of breast: Secondary | ICD-10-CM | POA: Diagnosis not present

## 2016-09-20 DIAGNOSIS — Z01419 Encounter for gynecological examination (general) (routine) without abnormal findings: Secondary | ICD-10-CM | POA: Diagnosis not present

## 2016-09-20 DIAGNOSIS — Z6820 Body mass index (BMI) 20.0-20.9, adult: Secondary | ICD-10-CM | POA: Diagnosis not present

## 2016-10-01 DIAGNOSIS — F411 Generalized anxiety disorder: Secondary | ICD-10-CM | POA: Diagnosis not present

## 2016-11-05 DIAGNOSIS — F411 Generalized anxiety disorder: Secondary | ICD-10-CM | POA: Diagnosis not present

## 2016-12-08 DIAGNOSIS — F411 Generalized anxiety disorder: Secondary | ICD-10-CM | POA: Diagnosis not present

## 2016-12-09 DIAGNOSIS — N760 Acute vaginitis: Secondary | ICD-10-CM | POA: Diagnosis not present

## 2017-01-19 DIAGNOSIS — F411 Generalized anxiety disorder: Secondary | ICD-10-CM | POA: Diagnosis not present

## 2017-02-02 DIAGNOSIS — D2261 Melanocytic nevi of right upper limb, including shoulder: Secondary | ICD-10-CM | POA: Diagnosis not present

## 2017-02-02 DIAGNOSIS — Z23 Encounter for immunization: Secondary | ICD-10-CM | POA: Diagnosis not present

## 2017-02-02 DIAGNOSIS — D225 Melanocytic nevi of trunk: Secondary | ICD-10-CM | POA: Diagnosis not present

## 2017-02-02 DIAGNOSIS — D485 Neoplasm of uncertain behavior of skin: Secondary | ICD-10-CM | POA: Diagnosis not present

## 2017-02-02 DIAGNOSIS — Z86018 Personal history of other benign neoplasm: Secondary | ICD-10-CM | POA: Diagnosis not present

## 2017-02-02 DIAGNOSIS — Z85828 Personal history of other malignant neoplasm of skin: Secondary | ICD-10-CM | POA: Diagnosis not present

## 2017-02-12 DIAGNOSIS — N390 Urinary tract infection, site not specified: Secondary | ICD-10-CM | POA: Diagnosis not present

## 2017-02-18 DIAGNOSIS — K644 Residual hemorrhoidal skin tags: Secondary | ICD-10-CM | POA: Diagnosis not present

## 2017-02-23 DIAGNOSIS — F411 Generalized anxiety disorder: Secondary | ICD-10-CM | POA: Diagnosis not present

## 2017-02-28 ENCOUNTER — Other Ambulatory Visit: Payer: Self-pay | Admitting: General Surgery

## 2017-02-28 DIAGNOSIS — K649 Unspecified hemorrhoids: Secondary | ICD-10-CM | POA: Diagnosis not present

## 2017-02-28 DIAGNOSIS — K640 First degree hemorrhoids: Secondary | ICD-10-CM | POA: Diagnosis not present

## 2017-02-28 DIAGNOSIS — K644 Residual hemorrhoidal skin tags: Secondary | ICD-10-CM | POA: Diagnosis not present

## 2017-02-28 DIAGNOSIS — K648 Other hemorrhoids: Secondary | ICD-10-CM | POA: Diagnosis not present

## 2017-03-09 DIAGNOSIS — D485 Neoplasm of uncertain behavior of skin: Secondary | ICD-10-CM | POA: Diagnosis not present

## 2017-03-09 DIAGNOSIS — D2261 Melanocytic nevi of right upper limb, including shoulder: Secondary | ICD-10-CM | POA: Diagnosis not present

## 2017-03-17 DIAGNOSIS — Z131 Encounter for screening for diabetes mellitus: Secondary | ICD-10-CM | POA: Diagnosis not present

## 2017-03-17 DIAGNOSIS — Z862 Personal history of diseases of the blood and blood-forming organs and certain disorders involving the immune mechanism: Secondary | ICD-10-CM | POA: Diagnosis not present

## 2017-03-17 DIAGNOSIS — Z0000 Encounter for general adult medical examination without abnormal findings: Secondary | ICD-10-CM | POA: Diagnosis not present

## 2017-03-17 DIAGNOSIS — Z13220 Encounter for screening for lipoid disorders: Secondary | ICD-10-CM | POA: Diagnosis not present

## 2017-03-17 DIAGNOSIS — Z23 Encounter for immunization: Secondary | ICD-10-CM | POA: Diagnosis not present

## 2017-03-23 DIAGNOSIS — F411 Generalized anxiety disorder: Secondary | ICD-10-CM | POA: Diagnosis not present

## 2017-04-29 DIAGNOSIS — F411 Generalized anxiety disorder: Secondary | ICD-10-CM | POA: Diagnosis not present

## 2017-06-03 DIAGNOSIS — F411 Generalized anxiety disorder: Secondary | ICD-10-CM | POA: Diagnosis not present

## 2017-07-01 DIAGNOSIS — H5789 Other specified disorders of eye and adnexa: Secondary | ICD-10-CM | POA: Diagnosis not present

## 2017-07-02 DIAGNOSIS — N390 Urinary tract infection, site not specified: Secondary | ICD-10-CM | POA: Diagnosis not present

## 2017-07-08 DIAGNOSIS — F411 Generalized anxiety disorder: Secondary | ICD-10-CM | POA: Diagnosis not present

## 2017-07-14 DIAGNOSIS — H01004 Unspecified blepharitis left upper eyelid: Secondary | ICD-10-CM | POA: Diagnosis not present

## 2017-07-14 DIAGNOSIS — H01001 Unspecified blepharitis right upper eyelid: Secondary | ICD-10-CM | POA: Diagnosis not present

## 2017-08-11 DIAGNOSIS — F411 Generalized anxiety disorder: Secondary | ICD-10-CM | POA: Diagnosis not present

## 2017-09-12 DIAGNOSIS — Z86018 Personal history of other benign neoplasm: Secondary | ICD-10-CM | POA: Diagnosis not present

## 2017-09-12 DIAGNOSIS — L905 Scar conditions and fibrosis of skin: Secondary | ICD-10-CM | POA: Diagnosis not present

## 2017-09-12 DIAGNOSIS — Z85828 Personal history of other malignant neoplasm of skin: Secondary | ICD-10-CM | POA: Diagnosis not present

## 2017-09-12 DIAGNOSIS — D225 Melanocytic nevi of trunk: Secondary | ICD-10-CM | POA: Diagnosis not present

## 2017-09-12 DIAGNOSIS — D485 Neoplasm of uncertain behavior of skin: Secondary | ICD-10-CM | POA: Diagnosis not present

## 2017-09-12 DIAGNOSIS — D224 Melanocytic nevi of scalp and neck: Secondary | ICD-10-CM | POA: Diagnosis not present

## 2017-09-15 DIAGNOSIS — F411 Generalized anxiety disorder: Secondary | ICD-10-CM | POA: Diagnosis not present

## 2017-09-27 DIAGNOSIS — Z01419 Encounter for gynecological examination (general) (routine) without abnormal findings: Secondary | ICD-10-CM | POA: Diagnosis not present

## 2017-09-27 DIAGNOSIS — Z6821 Body mass index (BMI) 21.0-21.9, adult: Secondary | ICD-10-CM | POA: Diagnosis not present

## 2017-09-27 DIAGNOSIS — Z1231 Encounter for screening mammogram for malignant neoplasm of breast: Secondary | ICD-10-CM | POA: Diagnosis not present

## 2017-10-20 DIAGNOSIS — F411 Generalized anxiety disorder: Secondary | ICD-10-CM | POA: Diagnosis not present

## 2017-12-07 DIAGNOSIS — F411 Generalized anxiety disorder: Secondary | ICD-10-CM | POA: Diagnosis not present

## 2018-01-11 DIAGNOSIS — F411 Generalized anxiety disorder: Secondary | ICD-10-CM | POA: Diagnosis not present

## 2018-01-24 DIAGNOSIS — F411 Generalized anxiety disorder: Secondary | ICD-10-CM | POA: Diagnosis not present

## 2018-02-08 DIAGNOSIS — D224 Melanocytic nevi of scalp and neck: Secondary | ICD-10-CM | POA: Diagnosis not present

## 2018-02-08 DIAGNOSIS — L905 Scar conditions and fibrosis of skin: Secondary | ICD-10-CM | POA: Diagnosis not present

## 2018-02-08 DIAGNOSIS — L700 Acne vulgaris: Secondary | ICD-10-CM | POA: Diagnosis not present

## 2018-02-08 DIAGNOSIS — D2262 Melanocytic nevi of left upper limb, including shoulder: Secondary | ICD-10-CM | POA: Diagnosis not present

## 2018-02-10 DIAGNOSIS — F411 Generalized anxiety disorder: Secondary | ICD-10-CM | POA: Diagnosis not present

## 2018-03-10 DIAGNOSIS — F411 Generalized anxiety disorder: Secondary | ICD-10-CM | POA: Diagnosis not present

## 2018-03-21 DIAGNOSIS — Z79899 Other long term (current) drug therapy: Secondary | ICD-10-CM | POA: Diagnosis not present

## 2018-03-21 DIAGNOSIS — Z0000 Encounter for general adult medical examination without abnormal findings: Secondary | ICD-10-CM | POA: Diagnosis not present

## 2018-03-21 DIAGNOSIS — Z862 Personal history of diseases of the blood and blood-forming organs and certain disorders involving the immune mechanism: Secondary | ICD-10-CM | POA: Diagnosis not present

## 2018-04-19 DIAGNOSIS — F411 Generalized anxiety disorder: Secondary | ICD-10-CM | POA: Diagnosis not present

## 2018-05-17 DIAGNOSIS — F411 Generalized anxiety disorder: Secondary | ICD-10-CM | POA: Diagnosis not present

## 2018-05-31 HISTORY — PX: CYST EXCISION: SHX5701

## 2018-06-14 DIAGNOSIS — F411 Generalized anxiety disorder: Secondary | ICD-10-CM | POA: Diagnosis not present

## 2018-07-19 DIAGNOSIS — F411 Generalized anxiety disorder: Secondary | ICD-10-CM | POA: Diagnosis not present

## 2018-09-13 DIAGNOSIS — F411 Generalized anxiety disorder: Secondary | ICD-10-CM | POA: Diagnosis not present

## 2018-09-19 DIAGNOSIS — L570 Actinic keratosis: Secondary | ICD-10-CM | POA: Diagnosis not present

## 2018-10-05 DIAGNOSIS — Z01419 Encounter for gynecological examination (general) (routine) without abnormal findings: Secondary | ICD-10-CM | POA: Diagnosis not present

## 2018-10-05 DIAGNOSIS — Z1231 Encounter for screening mammogram for malignant neoplasm of breast: Secondary | ICD-10-CM | POA: Diagnosis not present

## 2018-10-05 DIAGNOSIS — Z6821 Body mass index (BMI) 21.0-21.9, adult: Secondary | ICD-10-CM | POA: Diagnosis not present

## 2018-10-11 DIAGNOSIS — F411 Generalized anxiety disorder: Secondary | ICD-10-CM | POA: Diagnosis not present

## 2018-10-19 DIAGNOSIS — D225 Melanocytic nevi of trunk: Secondary | ICD-10-CM | POA: Diagnosis not present

## 2018-11-01 DIAGNOSIS — F411 Generalized anxiety disorder: Secondary | ICD-10-CM | POA: Diagnosis not present

## 2018-11-02 DIAGNOSIS — L111 Transient acantholytic dermatosis [Grover]: Secondary | ICD-10-CM | POA: Diagnosis not present

## 2018-11-02 DIAGNOSIS — D485 Neoplasm of uncertain behavior of skin: Secondary | ICD-10-CM | POA: Diagnosis not present

## 2018-12-11 DIAGNOSIS — F411 Generalized anxiety disorder: Secondary | ICD-10-CM | POA: Diagnosis not present

## 2018-12-28 DIAGNOSIS — D179 Benign lipomatous neoplasm, unspecified: Secondary | ICD-10-CM | POA: Diagnosis not present

## 2019-01-03 DIAGNOSIS — F411 Generalized anxiety disorder: Secondary | ICD-10-CM | POA: Diagnosis not present

## 2019-01-11 DIAGNOSIS — L111 Transient acantholytic dermatosis [Grover]: Secondary | ICD-10-CM | POA: Diagnosis not present

## 2019-01-31 DIAGNOSIS — F411 Generalized anxiety disorder: Secondary | ICD-10-CM | POA: Diagnosis not present

## 2019-02-07 DIAGNOSIS — D224 Melanocytic nevi of scalp and neck: Secondary | ICD-10-CM | POA: Diagnosis not present

## 2019-02-07 DIAGNOSIS — L905 Scar conditions and fibrosis of skin: Secondary | ICD-10-CM | POA: Diagnosis not present

## 2019-02-07 DIAGNOSIS — D225 Melanocytic nevi of trunk: Secondary | ICD-10-CM | POA: Diagnosis not present

## 2019-02-07 DIAGNOSIS — D2272 Melanocytic nevi of left lower limb, including hip: Secondary | ICD-10-CM | POA: Diagnosis not present

## 2019-02-09 ENCOUNTER — Other Ambulatory Visit: Payer: Self-pay | Admitting: Dermatology

## 2019-02-09 DIAGNOSIS — D499 Neoplasm of unspecified behavior of unspecified site: Secondary | ICD-10-CM

## 2019-02-13 ENCOUNTER — Other Ambulatory Visit: Payer: Self-pay | Admitting: Dermatology

## 2019-02-13 DIAGNOSIS — D499 Neoplasm of unspecified behavior of unspecified site: Secondary | ICD-10-CM

## 2019-02-15 ENCOUNTER — Ambulatory Visit
Admission: RE | Admit: 2019-02-15 | Discharge: 2019-02-15 | Disposition: A | Discharge status: Home / Self Care | Payer: BC Managed Care – PPO | Source: Ambulatory Visit | Attending: Dermatology | Admitting: Dermatology

## 2019-02-15 ENCOUNTER — Other Ambulatory Visit: Payer: Self-pay

## 2019-02-15 ENCOUNTER — Other Ambulatory Visit: Payer: BLUE CROSS/BLUE SHIELD

## 2019-02-15 ENCOUNTER — Other Ambulatory Visit: Payer: Self-pay | Admitting: Dermatology

## 2019-02-15 DIAGNOSIS — R922 Inconclusive mammogram: Secondary | ICD-10-CM | POA: Diagnosis not present

## 2019-02-15 DIAGNOSIS — D499 Neoplasm of unspecified behavior of unspecified site: Secondary | ICD-10-CM

## 2019-02-15 DIAGNOSIS — N6310 Unspecified lump in the right breast, unspecified quadrant: Secondary | ICD-10-CM | POA: Diagnosis not present

## 2019-02-22 DIAGNOSIS — F411 Generalized anxiety disorder: Secondary | ICD-10-CM | POA: Diagnosis not present

## 2019-02-28 DIAGNOSIS — F411 Generalized anxiety disorder: Secondary | ICD-10-CM | POA: Diagnosis not present

## 2019-03-03 DIAGNOSIS — Z23 Encounter for immunization: Secondary | ICD-10-CM | POA: Diagnosis not present

## 2019-03-14 DIAGNOSIS — F411 Generalized anxiety disorder: Secondary | ICD-10-CM | POA: Diagnosis not present

## 2019-03-14 DIAGNOSIS — Z0000 Encounter for general adult medical examination without abnormal findings: Secondary | ICD-10-CM | POA: Diagnosis not present

## 2019-03-14 DIAGNOSIS — Z79899 Other long term (current) drug therapy: Secondary | ICD-10-CM | POA: Diagnosis not present

## 2019-03-14 DIAGNOSIS — Z862 Personal history of diseases of the blood and blood-forming organs and certain disorders involving the immune mechanism: Secondary | ICD-10-CM | POA: Diagnosis not present

## 2019-03-14 DIAGNOSIS — Z13220 Encounter for screening for lipoid disorders: Secondary | ICD-10-CM | POA: Diagnosis not present

## 2019-03-28 DIAGNOSIS — F411 Generalized anxiety disorder: Secondary | ICD-10-CM | POA: Diagnosis not present

## 2019-04-17 DIAGNOSIS — F411 Generalized anxiety disorder: Secondary | ICD-10-CM | POA: Diagnosis not present

## 2019-04-19 DIAGNOSIS — D1721 Benign lipomatous neoplasm of skin and subcutaneous tissue of right arm: Secondary | ICD-10-CM | POA: Diagnosis not present

## 2019-05-02 DIAGNOSIS — F411 Generalized anxiety disorder: Secondary | ICD-10-CM | POA: Diagnosis not present

## 2019-05-16 DIAGNOSIS — F411 Generalized anxiety disorder: Secondary | ICD-10-CM | POA: Diagnosis not present

## 2019-06-15 DIAGNOSIS — Z01818 Encounter for other preprocedural examination: Secondary | ICD-10-CM | POA: Diagnosis not present

## 2019-06-19 DIAGNOSIS — F411 Generalized anxiety disorder: Secondary | ICD-10-CM | POA: Diagnosis not present

## 2019-06-21 ENCOUNTER — Other Ambulatory Visit: Payer: Self-pay | Admitting: General Surgery

## 2019-06-21 DIAGNOSIS — D171 Benign lipomatous neoplasm of skin and subcutaneous tissue of trunk: Secondary | ICD-10-CM | POA: Diagnosis not present

## 2019-06-21 DIAGNOSIS — R222 Localized swelling, mass and lump, trunk: Secondary | ICD-10-CM | POA: Diagnosis not present

## 2019-06-21 DIAGNOSIS — L918 Other hypertrophic disorders of the skin: Secondary | ICD-10-CM | POA: Diagnosis not present

## 2019-07-09 ENCOUNTER — Ambulatory Visit: Payer: BC Managed Care – PPO

## 2019-07-24 DIAGNOSIS — F411 Generalized anxiety disorder: Secondary | ICD-10-CM | POA: Diagnosis not present

## 2019-09-18 DIAGNOSIS — F411 Generalized anxiety disorder: Secondary | ICD-10-CM | POA: Diagnosis not present

## 2019-10-22 DIAGNOSIS — Z90710 Acquired absence of both cervix and uterus: Secondary | ICD-10-CM | POA: Insufficient documentation

## 2019-11-11 IMAGING — MG MM DIGITAL DIAGNOSTIC UNILAT*R* W/ TOMO W/ CAD
8 of 13 series · 8 of 29 positions shown · non-contrast
Comparison: Previous exam(s).

CLINICAL DATA: Patient presents with a palpable mass in the right
axilla that she feels has increased in size [REDACTED].

EXAM:
DIGITAL DIAGNOSTIC RIGHT MAMMOGRAM WITH CAD AND TOMO
ULTRASOUND RIGHT BREAST

[R ML (1 of 3)]
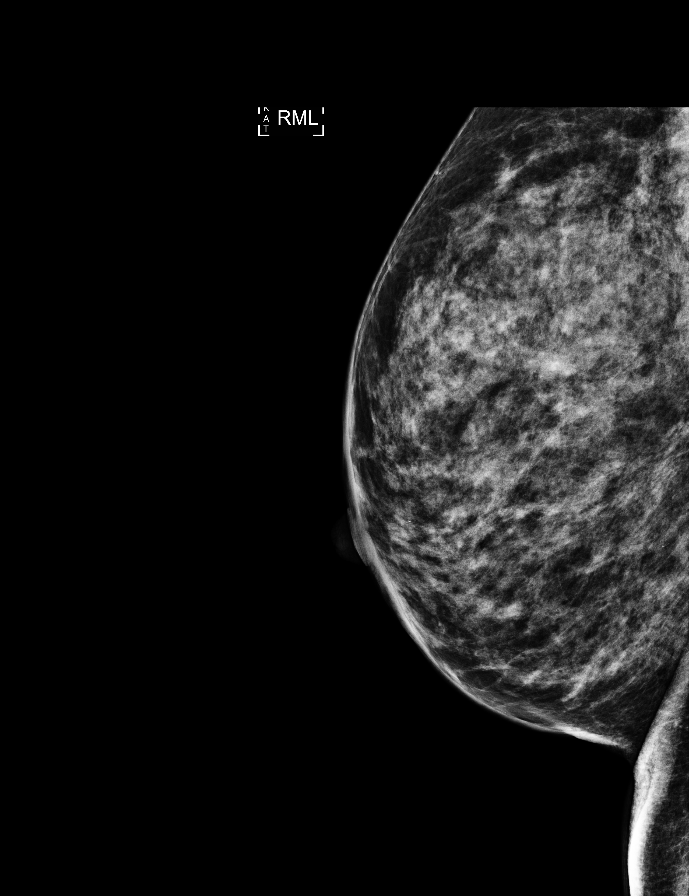

[R CC (1 of 2)]
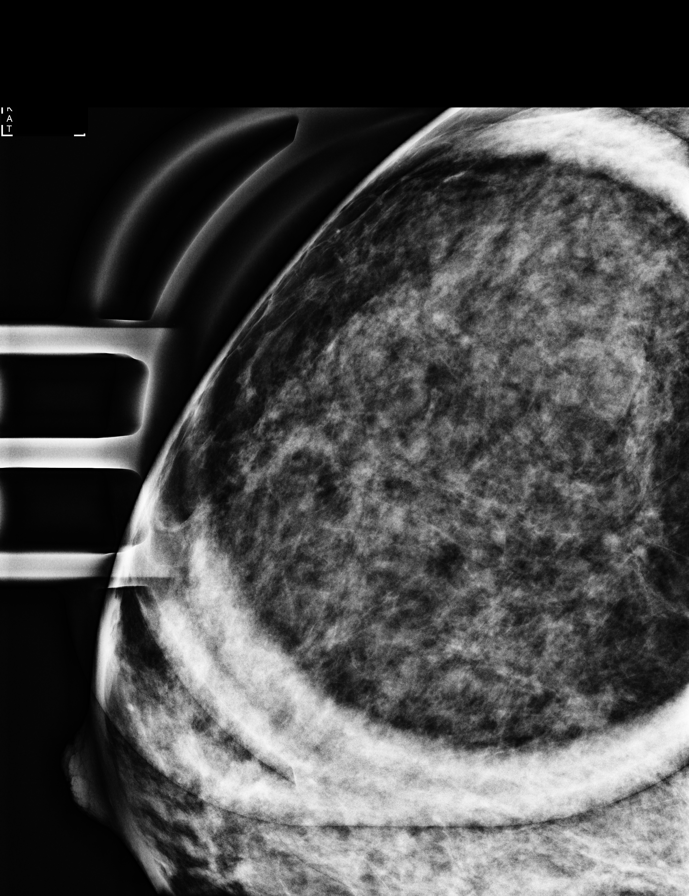

[R ML (2 of 3)]
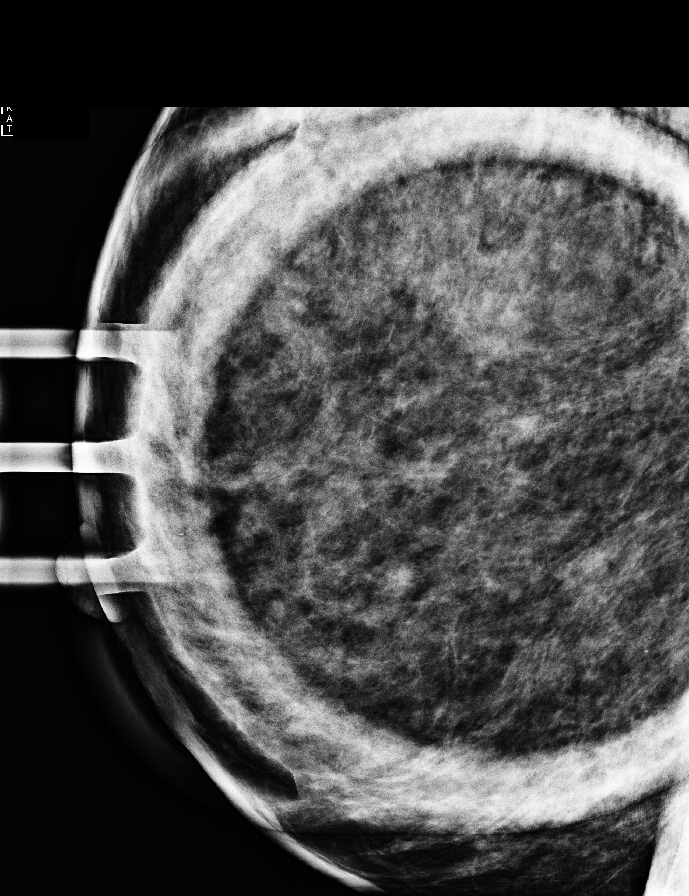

[R CC (2 of 2)]
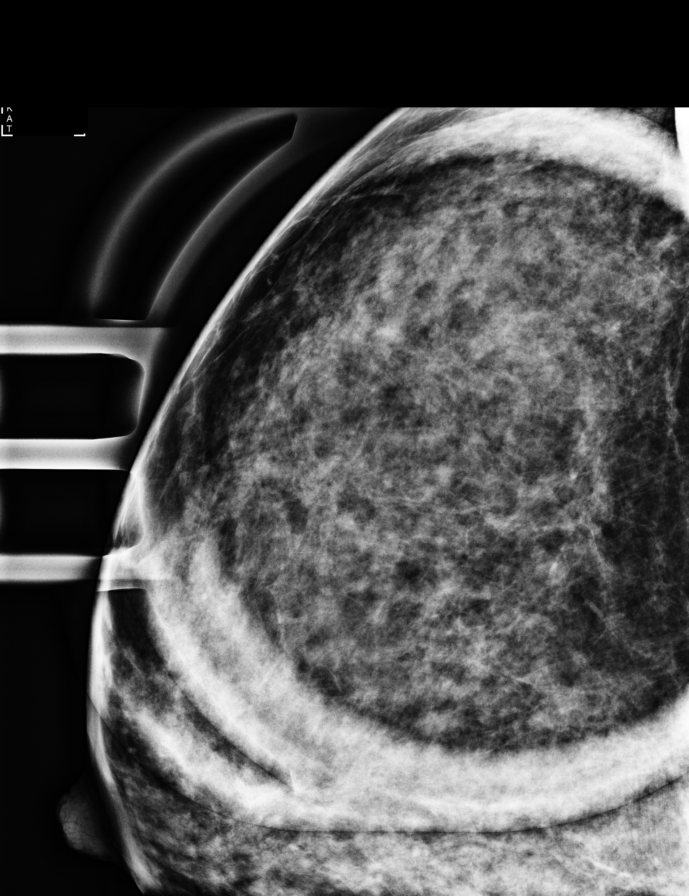

[R ML (3 of 3)]
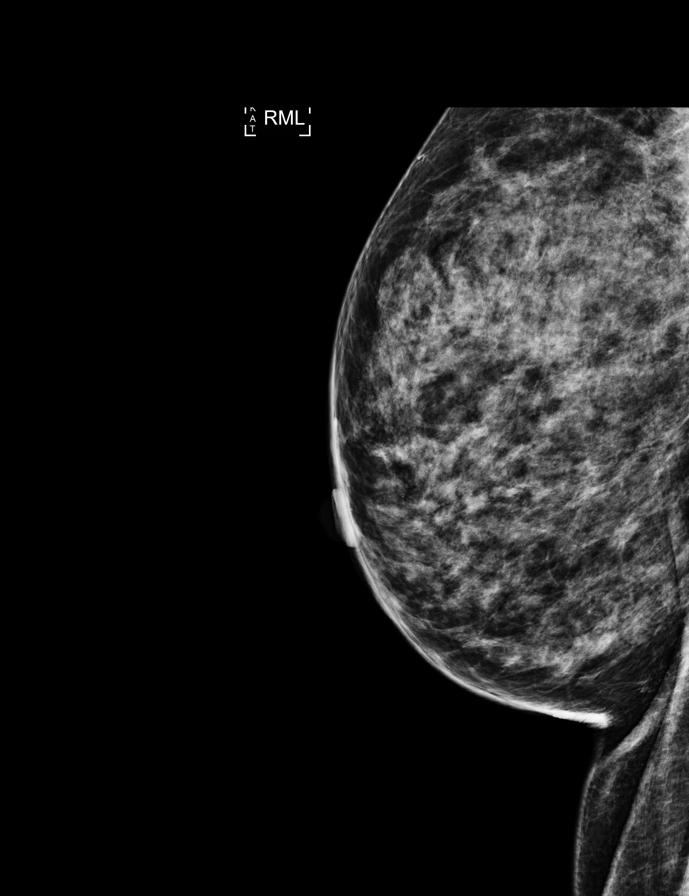

[R MLO synth-2D (1 of 2)]
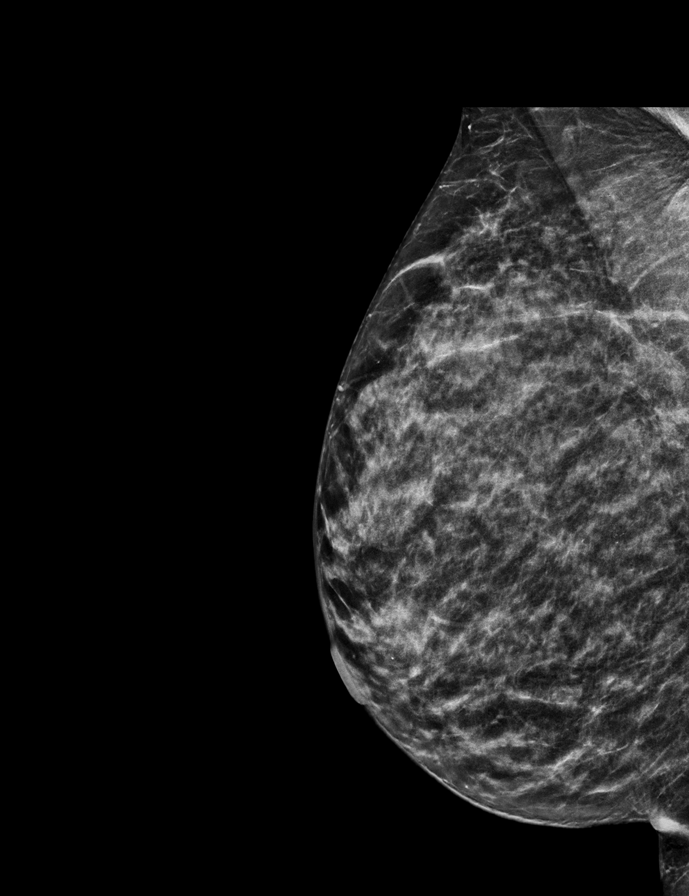

[R TAN synth-2D]
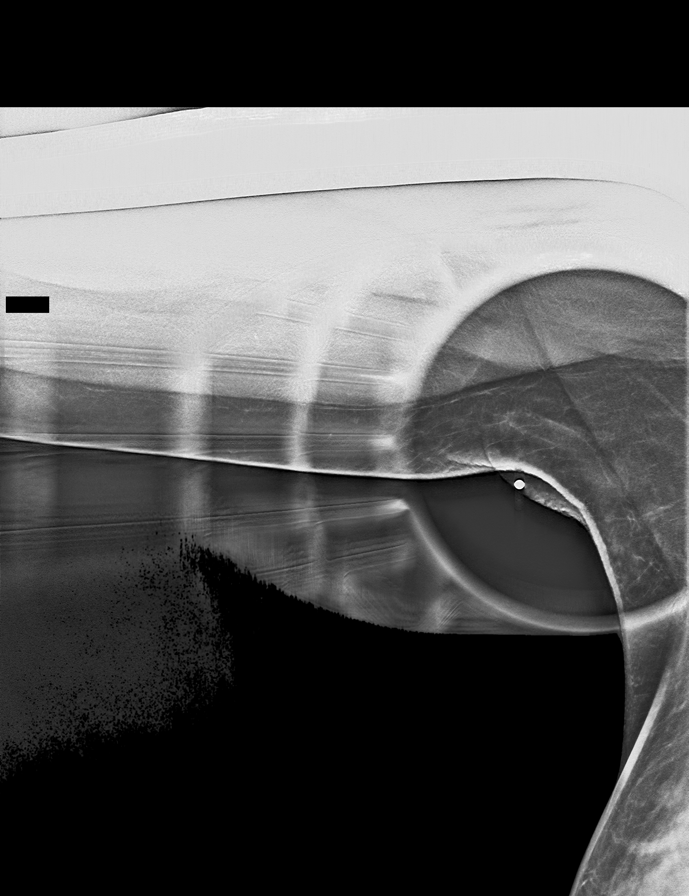

[R MLO synth-2D (2 of 2)]
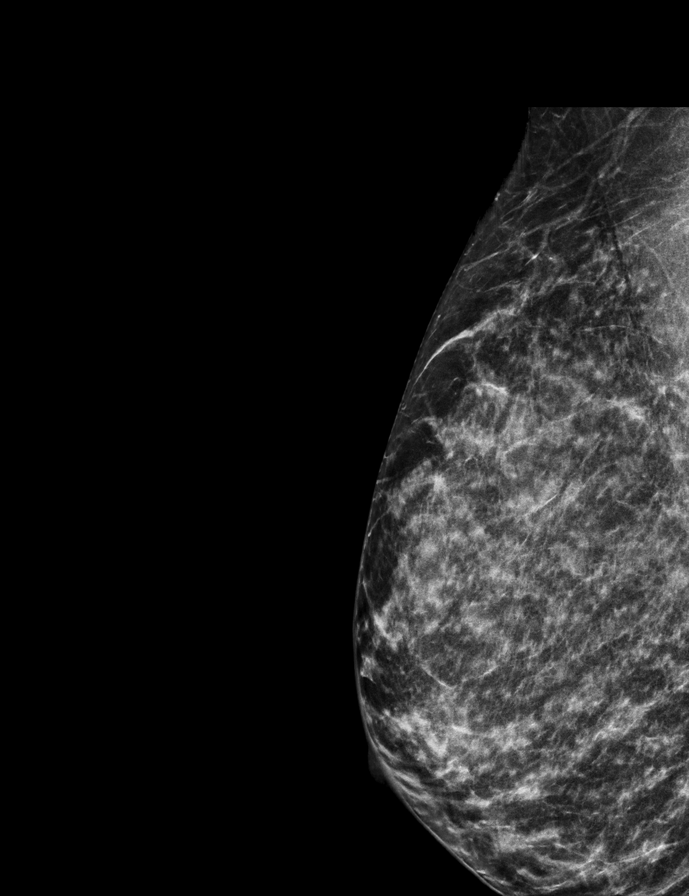

[8 of 29 positions shown; findings below may reference images not displayed]

ACR Breast Density Category c: The breast tissue is heterogeneously
dense, which may obscure small masses.
FINDINGS: Mammogram: Spot compression views were performed at the site of
palpable concern in the right axilla demonstrating fatty tissue.
There is suggestion of an encapsulated fatty mass measuring
approximately 2.8 cm. There is no suspicious mass or calcification.

Mammographic images were processed with CAD.

On physical exam, I palpate a smooth oval shaped mass in the right
axilla at the site of the palpable abnormality.

Targeted ultrasound is performed in the right axilla demonstrating
increased fat density superficially just beneath the skin surface.
There is suggestion of borders surrounding the fat on the antiradial
view. This area measures approximately 3.1 x 0.4 x 1.8 cm.
IMPRESSION: At the palpable site of concern in the right axilla there is only
fatty tissue with suggestion of a capsule on the mammogram, possibly
representing a lipoma. This measures approximately 3 x 2 cm. No
mammographic or sonographic evidence of malignancy.

RECOMMENDATION:
Recommend further workup be on a clinical basis. Return to routine
annual screening.

I have discussed the findings and recommendations with the patient.
If applicable, a reminder letter will be sent to the patient
regarding the next appointment.

BI-RADS CATEGORY  2: Benign.

## 2020-02-01 ENCOUNTER — Ambulatory Visit
Payer: Managed Care, Other (non HMO) | Attending: Rehabilitative and Restorative Service Providers" | Admitting: Physical Therapy

## 2020-02-01 ENCOUNTER — Encounter: Payer: Self-pay | Admitting: Physical Therapy

## 2020-02-01 ENCOUNTER — Other Ambulatory Visit: Payer: Self-pay

## 2020-02-01 DIAGNOSIS — M25551 Pain in right hip: Secondary | ICD-10-CM | POA: Diagnosis not present

## 2020-02-01 DIAGNOSIS — R262 Difficulty in walking, not elsewhere classified: Secondary | ICD-10-CM

## 2020-02-01 NOTE — Addendum Note (Signed)
Addended by: Beaulah Dinning S on: 02/01/2020 11:35 AM   Modules accepted: Orders

## 2020-02-01 NOTE — Therapy (Signed)
[place], [place], [address] Phone: [phone]   Fax:  [phone]  Physical Therapy Evaluation  Patient Details  Name: [name] MRN: [id number] Date of Birth: February 14, 1976 Referring Provider (PT): Birdena Jubilee, PA-C   Encounter Date: 02/01/2020   PT End of Session - 02/01/20 1118    Visit Number 1    Number of Visits 12    Date for PT Re-Evaluation 03/14/20    Authorization Type [employer], recheck FOTO status by visit 6    PT Start Time 0845    PT Stop Time 0928    PT Time Calculation (min) 43 min    Activity Tolerance Patient tolerated treatment well    Behavior During Therapy [facility] for tasks assessed/performed           Past Medical History:  Diagnosis Date  . 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 Chronic idiopathic monocytosis 09/29/2011   10-22%  . Chronic ITP (idiopathic thrombocytopenia) (HCC) 09/28/2011   Dx during pregnancy 12/07-Dr. [name] consulting w/Dr. Gertie Fey for this  . Leukopenia 09/29/2011  . Parotid mass 09/28/2011   S/P excision benign mass R parotid 12/09    Past Surgical History:  Procedure Laterality Date  . CESAREAN SECTION  2008  . LAPAROSCOPIC ASSISTED VAGINAL HYSTERECTOMY N/A 10/25/2012   Procedure: LAPAROSCOPIC ASSISTED VAGINAL HYSTERECTOMY;  Surgeon: Allena Katz, MD;  Location: [facility];  Service: Gynecology;  Laterality: N/A;  with aspiration of left ovarian cyst  . TUBAL LIGATION     Feb 2012    There were no vitals filed for this visit.    Subjective Assessment - 02/01/20 0839    Subjective Pt. 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 Hip pain was exacerbated with recent trip to [facility] in mid-July with hiking with increased pain to the degree that she had  to use crutches for a few days. Pt. saw MD with X-ray findings of mild femoral head subchondral collapse with right hip degenerative changes with previous surgical history/AVN. Plan is to try conservative tx. for relief. Pain has been in several locations around the hip including anterior hip/hip flexor region and lateral hip + posterolateral hip/piriformis region.    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 reports blood/platelets stable    Limitations Standing;Walking    Diagnostic tests X-rays    Patient Stated Goals Make sure that hip pain does not flare up again as it did during trip    Currently in Pain? No/denies              [facility] PT Assessment - 02/01/20 0001      Assessment   Medical Diagnosis Right hip pain   with history right hip surgery 2009   Referring Provider (PT) Birdena Jubilee, PA-C    Onset Date/Surgical Date 12/25/19   surgical history in 2009-see subjective   Hand Dominance Right    Prior Therapy none      Precautions   Precautions None      Restrictions   Weight Bearing Restrictions No      Balance Screen   Has the patient fallen in the past 6 months No      Prior Function  Level of Independence Independent with basic ADLs      Cognition   Overall Cognitive Status Within Functional Limits for tasks assessed      Observation/Other Assessments   Focus on Therapeutic Outcomes (FOTO)  40% limited      ROM / Strength   AROM / PROM / Strength AROM;PROM;Strength      AROM   AROM Assessment Site Hip    Right/Left Hip Right;Left    Right Hip Flexion 120    Right Hip External Rotation  42    Right Hip Internal Rotation  12    Right Hip ABduction 50    Right Hip ADduction --   WFL   Left Hip Flexion 130    Left Hip External Rotation  45    Left Hip Internal Rotation  25    Left Hip ABduction 50    Left Hip ADduction --   WFL     PROM   Overall PROM Comments Mild limitation of  right hip IR>flexion at end-range otherwise hip PROM grossly [facility]    PROM Assessment Site Hip    Right/Left Hip --      Strength   Strength Assessment Site Hip    Right/Left Hip Right;Left    Right Hip Flexion 5/5    Right Hip Extension 4/5    Right Hip External Rotation  5/5    Right Hip Internal Rotation 5/5    Right Hip ABduction 4+/5    Right Hip ADduction 5/5    Left Hip Flexion 5/5    Left Hip Extension 5/5    Left Hip External Rotation 5/5    Left Hip Internal Rotation 5/5    Left Hip ABduction 5/5    Left Hip ADduction 5/5      Flexibility   Soft Tissue Assessment /Muscle Length --   right hip flexor and piriformis/glut tightness     Palpation   Palpation comment Tight with tenderness to palpation right TFL, gluteus medius and piriformis      Special Tests   Other special tests FABER and FADIR (-), Scour (-), SLR (-), hip flexor tightness with modified Thomas test at edge of mat                      Objective measurements completed on examination: See above findings.       [facility] Adult PT Treatment/Exercise - 02/01/20 0001      Exercises   Exercises --   HEP handout review                 PT Education - 02/01/20 1117    Education Details eval findings, symptom etiology, HEP, POC    Person(s) Educated Patient    Methods Explanation    Comprehension Verbalized understanding               PT Long Term Goals - 02/01/20 1127      PT LONG TERM GOAL #1   Title Independent with HEP    Baseline needs HEP    Time 6    Period Weeks    Status New    Target Date 03/14/20      PT LONG TERM GOAL #2   Title Improve FOTO outcome measure score to 20% or less impairment    Baseline 40% limited    Time 6    Period Weeks    Status New    Target Date 03/14/20  PT LONG TERM GOAL #3   Title Increase right hip strength to 5/5 for abduction and extension to improve hip stability for outdoor ambulation over uneven surfaces and improve  ability lifting activities    Baseline 4+/5 abduction, 4/5 extension    Time 6    Period Weeks    Status New    Target Date 03/14/20      PT LONG TERM GOAL #4   Title Tolerate standing/ambulation for community mobility and recreational activities for walking exercise/hiking periods at least 20-30 min with right hip pain <2/10    Time 6    Period Weeks    Status New    Target Date 03/14/20                  Plan - 02/01/20 1119    Clinical Impression Statement Pt. presents with recent right hip pain exacerbation with past surgical history 2009 with X-ray findings as noted in subjective. Given muscle tightness noted at eval along with symptom reproduction on palpation suspect myofascial contribution to current symptoms particularly for posterolateral hip/piriformis region pain with potential intrinsic joint symptoms vs. hip flexor involvement for anterior hip pain symptoms. Additional findings of hip weakness in extension>abduction. Pt. would benefit from PT to help relieve hip pain and address associated functional limitations for mobility.    Personal Factors and Comorbidities Other;Comorbidity 1    Comorbidities surgical history, comorbidities with history ITP, AVN    Examination-Activity Limitations Locomotion Level;Stand    Examination-Participation Restrictions Community Activity;Shop   recreation-hiking   Stability/Clinical Decision Making Stable/Uncomplicated    Clinical Decision Making Low    Rehab Potential Good    PT Frequency --   1-2x/week   PT Duration 6 weeks    PT Treatment/Interventions ADLs/Self Care Home Management;Cryotherapy;Electrical Stimulation;Ultrasound;Iontophoresis 4mg /ml Dexamethasone;Moist Heat;Gait training;Functional mobility training;Therapeutic activities;Neuromuscular re-education;Therapeutic exercise;Patient/family education;Manual techniques;Dry needling;Taping    PT Next Visit Plan Review FOTO patient report by visit 6 and HEP as needed,  potential trial dry needling to right TFL, gluteus ,medius, piriformis, if needed psoas, manual for gentle hip distraction, STM/IASTM prn, work on progression hip strengthening and stretches as tolerated pending pain-initially more open chain focus but add/progress CKC as tolerated pending pain    PT Home Exercise Plan Access code: [email or web address]    Consulted and Agree with Plan of Care Patient           Patient will benefit from skilled therapeutic intervention in order to improve the following deficits and impairments:  Pain, Impaired flexibility, Decreased strength, Difficulty walking  Visit Diagnosis: Pain in right hip  Difficulty in walking, not elsewhere classified     Problem List Patient Active Problem List   Diagnosis Date Noted  . Leukopenia 09/29/2011  . Chronic idiopathic monocytosis 09/29/2011  . Chronic ITP (idiopathic thrombocytopenia) (HCC) 09/28/2011  . 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 Parotid mass 09/28/2011    Beaulah Dinning, PT, DPT 02/01/20 11:32 AM  [facility] [address] [place], [place], [address] Phone: [phone]   Fax:  [phone]  Name: MARELI ANTUNES MRN: [id number] Date of Birth: Feb 15, 1976

## 2020-02-11 ENCOUNTER — Ambulatory Visit: Payer: Managed Care, Other (non HMO) | Admitting: Physical Therapy

## 2020-02-11 ENCOUNTER — Other Ambulatory Visit: Payer: Self-pay

## 2020-02-11 ENCOUNTER — Encounter: Payer: Self-pay | Admitting: Physical Therapy

## 2020-02-11 DIAGNOSIS — M25551 Pain in right hip: Secondary | ICD-10-CM

## 2020-02-11 DIAGNOSIS — R262 Difficulty in walking, not elsewhere classified: Secondary | ICD-10-CM

## 2020-02-11 NOTE — Therapy (Signed)
[place], [place], [address] Phone: [phone]   Fax:  [phone]  Physical Therapy Treatment  Patient Details  Name: [name] MRN: [id number] Date of Birth: [birth year] Referring Provider (PT): Birdena Jubilee, PA-C   Encounter Date: 02/11/2020   PT End of Session - 02/11/20 0935    Visit Number 2    Number of Visits 12    Date for PT Re-Evaluation 03/14/20    Authorization Type [employer], recheck FOTO status by visit 6    PT Start Time 0930    PT Stop Time 1012    PT Time Calculation (min) 42 min    Activity Tolerance Patient tolerated treatment well    Behavior During Therapy [facility] for tasks assessed/performed           Past Medical History:  Diagnosis Date  . 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 Chronic idiopathic monocytosis 09/29/2011   10-22%  . Chronic ITP (idiopathic thrombocytopenia) (HCC) 09/28/2011   Dx during pregnancy 12/07-Dr. [name] consulting w/Dr. Gertie Fey for this  . Leukopenia 09/29/2011  . Parotid mass 09/28/2011   S/P excision benign mass R parotid 12/09    Past Surgical History:  Procedure Laterality Date  . CESAREAN SECTION  2008  . LAPAROSCOPIC ASSISTED VAGINAL HYSTERECTOMY N/A 10/25/2012   Procedure: LAPAROSCOPIC ASSISTED VAGINAL HYSTERECTOMY;  Surgeon: Allena Katz, MD;  Location: [facility];  Service: Gynecology;  Laterality: N/A;  with aspiration of left ovarian cyst  . TUBAL LIGATION     Feb 2012    There were no vitals filed for this visit.   Subjective Assessment - 02/11/20 0933    Subjective Patient rode her bike over the weekend. She feels a little stiff and sore today but overall she is feeling better.    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 reports blood/platelets stable    Limitations Standing;Walking      Diagnostic tests X-rays    Patient Stated Goals Make sure that hip pain does not flare up again as it did during trip    Currently in Pain? No/denies              [facility] PT Assessment - 02/11/20 0001      Assessment   Medical Diagnosis Right hip pain   with history right hip surgery 2009   Referring Provider (PT) Birdena Jubilee, PA-C                         [facility] Adult PT Treatment/Exercise - 02/11/20 0001      Exercises   Exercises Other Exercises      Lumbar Exercises: Stretches   Active Hamstring Stretch Limitations reviewed with a strap 2x20 sec hold     ITB Stretch Limitations reviewed with a streap. Feels more pull in hamstrings.     Piriformis Stretch Limitations 2x20 sec hold       Lumbar Exercises: Standing   Other Standing Lumbar Exercises lateral band walk red 2x10 steps; monster walk 2x10 red       Lumbar Exercises: Supine   Bridge Limitations x20 red band around legs     Bridge with March 15 reps      Manual Therapy   Manual Therapy Joint mobilization;Soft tissue mobilization    Manual therapy  comments skilled palpation of trigger points     Soft tissue mobilization to gluteal             Trigger Point Dry Needling - 02/11/20 0001    Consent Given? Yes    Education Handout Provided Yes    Muscles Treated Back/Hip Gluteus medius;Piriformis    Dry Needling Comments .30x75 needle to the gluteal and piriformis 2 spots in each     Gluteus Medius Response Twitch response elicited    Piriformis Response Twitch response elicited;Palpable increased muscle length                PT Education - 02/11/20 1312    Education Details benefits and risk of TPDN, patient given needling sheet,    Person(s) Educated Patient    Methods Demonstration;Explanation    Comprehension Verbalized understanding;Returned demonstration;Tactile cues required               PT Long Term Goals - 02/01/20 1127      PT LONG TERM GOAL #1   Title  Independent with HEP    Baseline needs HEP    Time 6    Period Weeks    Status New    Target Date 03/14/20      PT LONG TERM GOAL #2   Title Improve FOTO outcome measure score to 20% or less impairment    Baseline 40% limited    Time 6    Period Weeks    Status New    Target Date 03/14/20      PT LONG TERM GOAL #3   Title Increase right hip strength to 5/5 for abduction and extension to improve hip stability for outdoor ambulation over uneven surfaces and improve ability lifting activities    Baseline 4+/5 abduction, 4/5 extension    Time 6    Period Weeks    Status New    Target Date 03/14/20      PT LONG TERM GOAL #4   Title Tolerate standing/ambulation for community mobility and recreational activities for walking exercise/hiking periods at least 20-30 min with right hip pain <2/10    Time 6    Period Weeks    Status New    Target Date 03/14/20                 Plan - 02/11/20 1318    Clinical Impression Statement Patient had a good twitch response to dry needling of the lguteal and piriformis. She reported feeling looser after treatment. Therapy reviewed higher level strengthening with her. She was advised to listent o whather hip and gluteal tell her with the exercises. If her symptoms are exacerbated stay with the nasivc exercises otherwise adbvance to the harder exercises. She was iven the FAQ sheet on needling. Therap will continue to progress as tolerated.    Personal Factors and Comorbidities Other;Comorbidity 1    Comorbidities surgical history, comorbidities with history ITP, AVN    Examination-Activity Limitations Locomotion Level;Stand    Examination-Participation Restrictions Community Activity;Shop    Stability/Clinical Decision Making Stable/Uncomplicated    Clinical Decision Making Low    Rehab Potential Good    PT Duration 6 weeks    PT Treatment/Interventions ADLs/Self Care Home Management;Cryotherapy;Electrical Stimulation;Ultrasound;Iontophoresis  4mg /ml Dexamethasone;Moist Heat;Gait training;Functional mobility training;Therapeutic activities;Neuromuscular re-education;Therapeutic exercise;Patient/family education;Manual techniques;Dry needling;Taping    PT Next Visit Plan Review FOTO patient report by visit 6 and HEP as needed, potential trial dry needling to right TFL, gluteus ,medius, piriformis, if needed psoas, manual for  gentle hip distraction, STM/IASTM prn, work on progression hip strengthening and stretches as tolerated pending pain-initially more open chain focus but add/progress CKC as tolerated pending pain    PT Home Exercise Plan Access code: [email or web address]    Consulted and Agree with Plan of Care Patient           Patient will benefit from skilled therapeutic intervention in order to improve the following deficits and impairments:  Pain, Impaired flexibility, Decreased strength, Difficulty walking  Visit Diagnosis: Pain in right hip  Difficulty in walking, not elsewhere classified     Problem List Patient Active Problem List   Diagnosis Date Noted  . Leukopenia 09/29/2011  . Chronic idiopathic monocytosis 09/29/2011  . Chronic ITP (idiopathic thrombocytopenia) (HCC) 09/28/2011  . 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 Parotid mass 09/28/2011    Carney Living PT DPT  02/11/2020, 1:22 PM  [facility] [address] [place], [place], [address] Phone: [phone]   Fax:  [phone]  Name: [name] MRN: [id number] Date of Birth: 1975-12-29

## 2020-02-21 ENCOUNTER — Encounter: Payer: Self-pay | Admitting: Physical Therapy

## 2020-02-21 ENCOUNTER — Ambulatory Visit: Payer: Managed Care, Other (non HMO) | Admitting: Physical Therapy

## 2020-02-21 ENCOUNTER — Other Ambulatory Visit: Payer: Self-pay

## 2020-02-21 DIAGNOSIS — M25551 Pain in right hip: Secondary | ICD-10-CM | POA: Diagnosis not present

## 2020-02-21 DIAGNOSIS — R262 Difficulty in walking, not elsewhere classified: Secondary | ICD-10-CM

## 2020-02-21 NOTE — Therapy (Signed)
[place], [place], [address] Phone: [phone]   Fax:  [phone]  Physical Therapy Treatment  Patient Details  Name: [name] MRN: [id number] Date of Birth: 21-Jun-1975 Referring Provider (PT): Birdena Jubilee, PA-C   Encounter Date: 02/21/2020   PT End of Session - 02/21/20 1403    Visit Number 3    Number of Visits 12    Date for PT Re-Evaluation 03/14/20    Authorization Type [employer], recheck FOTO status by visit 6    PT Start Time 1322    PT Stop Time 1412    PT Time Calculation (min) 50 min    Activity Tolerance Patient tolerated treatment well    Behavior During Therapy [facility] for tasks assessed/performed           Past Medical History:  Diagnosis Date  . 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 Chronic idiopathic monocytosis 09/29/2011   10-22%  . Chronic ITP (idiopathic thrombocytopenia) (HCC) 09/28/2011   Dx during pregnancy 12/07-Dr. [name] consulting w/Dr. Gertie Fey for this  . Leukopenia 09/29/2011  . Parotid mass 09/28/2011   S/P excision benign mass R parotid 12/09    Past Surgical History:  Procedure Laterality Date  . CESAREAN SECTION  2008  . LAPAROSCOPIC ASSISTED VAGINAL HYSTERECTOMY N/A 10/25/2012   Procedure: LAPAROSCOPIC ASSISTED VAGINAL HYSTERECTOMY;  Surgeon: Allena Katz, MD;  Location: [facility];  Service: Gynecology;  Laterality: N/A;  with aspiration of left ovarian cyst  . TUBAL LIGATION     Feb 2012    There were no vitals filed for this visit.   Subjective Assessment - 02/21/20 1324    Subjective Some soreness after bike ride yesterday, right hip pain 1/10 this PM. Had some bruising after dry needling last session still with a residual bruise butb felt it was helpful.    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  reports blood/platelets stable                             [facility] Adult PT Treatment/Exercise - 02/21/20 0001      Exercises   Exercises Knee/Hip      Lumbar Exercises: Standing   Other Standing Lumbar Exercises Monster walk green band proximal to knees 20 feet x 2, lateral band walks green band 20 feet x 2      Knee/Hip Exercises: Seated   Other Seated Knee/Hip Exercises seated hip ER green band 2x10      Knee/Hip Exercises: Supine   Bridges with Diona Foley Squeeze AROM;Strengthening;Both;2 sets;10 reps      Manual Therapy   Manual Therapy Joint mobilization    Joint Mobilization Right hip LAD grade I-III oscillations, hip caudal glides and ER mobs with belt grade I-III oscillations    Soft tissue mobilization IASTM right posterolateral hip with roller use            Trigger Point Dry Needling - 02/21/20 0001    Consent Given? Yes    Muscles Treated Back/Hip Gluteus medius;Piriformis;Tensor fascia lata    Dry Needling Comments needling in left sidelying with 30-32 gauge 50 mm needles    Electrical Stimulation Performed with Dry Needling Yes    E-stim with Dry Needling Details TENS 20 pps x 10 minutes  PT Education - 02/21/20 1402    Education Details exercises    Person(s) Educated Patient    Methods Explanation;Demonstration;Verbal cues    Comprehension Verbalized understanding               PT Long Term Goals - 02/01/20 1127      PT LONG TERM GOAL #1   Title Independent with HEP    Baseline needs HEP    Time 6    Period Weeks    Status New    Target Date 03/14/20      PT LONG TERM GOAL #2   Title Improve FOTO outcome measure score to 20% or less impairment    Baseline 40% limited    Time 6    Period Weeks    Status New    Target Date 03/14/20      PT LONG TERM GOAL #3   Title Increase right hip strength to 5/5 for abduction and extension to improve hip stability for outdoor ambulation over uneven surfaces and  improve ability lifting activities    Baseline 4+/5 abduction, 4/5 extension    Time 6    Period Weeks    Status New    Target Date 03/14/20      PT LONG TERM GOAL #4   Title Tolerate standing/ambulation for community mobility and recreational activities for walking exercise/hiking periods at least 20-30 min with right hip pain <2/10    Time 6    Period Weeks    Status New    Target Date 03/14/20                 Plan - 02/21/20 1403    Clinical Impression Statement Continued hip strengthening/stabilization and added some joint mobs as noted per flowsheet with good tolerance. Continued dry needling but with gentle technique with minimal needle manipulation to minimize bruising. Status for progress still ongoing but given underlying etiology expect progress will be gradual.    Personal Factors and Comorbidities Other;Comorbidity 1    Comorbidities surgical history, comorbidities with history ITP, AVN    Examination-Activity Limitations Locomotion Level;Stand    Examination-Participation Restrictions Community Activity;Shop    Stability/Clinical Decision Making Stable/Uncomplicated    Clinical Decision Making Low    Rehab Potential Good    PT Frequency --   1-2x/week   PT Duration 6 weeks    PT Treatment/Interventions ADLs/Self Care Home Management;Cryotherapy;Electrical Stimulation;Ultrasound;Iontophoresis 4mg /ml Dexamethasone;Moist Heat;Gait training;Functional mobility training;Therapeutic activities;Neuromuscular re-education;Therapeutic exercise;Patient/family education;Manual techniques;Dry needling;Taping    PT Next Visit Plan Continue hip stabilization/strengthening, manual for gentle hip mobs and STM, further dry needling but monitor bruising    PT Home Exercise Plan Access code: [email or web address]    Consulted and Agree with Plan of Care Patient           Patient will benefit from skilled therapeutic intervention in order to improve the following deficits and impairments:   Pain, Impaired flexibility, Decreased strength, Difficulty walking  Visit Diagnosis: Pain in right hip  Difficulty in walking, not elsewhere classified     Problem List Patient Active Problem List   Diagnosis Date Noted  . Leukopenia 09/29/2011  . Chronic idiopathic monocytosis 09/29/2011  . Chronic ITP (idiopathic thrombocytopenia) (HCC) 09/28/2011  . 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 Parotid mass 09/28/2011    Beaulah Dinning, PT, DPT 02/21/20 2:10 PM  [facility] [address] [place], [place], [address] Phone: [phone]   Fax:  [phone]  Name: KALEEN ROCHETTE  MRN: [id number] Date of Birth: May 06, 1976

## 2020-02-26 ENCOUNTER — Other Ambulatory Visit: Payer: Self-pay

## 2020-02-26 ENCOUNTER — Encounter: Payer: Self-pay | Admitting: Physical Therapy

## 2020-02-26 ENCOUNTER — Ambulatory Visit: Payer: Managed Care, Other (non HMO) | Admitting: Physical Therapy

## 2020-02-26 DIAGNOSIS — M25551 Pain in right hip: Secondary | ICD-10-CM

## 2020-02-26 DIAGNOSIS — R262 Difficulty in walking, not elsewhere classified: Secondary | ICD-10-CM

## 2020-02-26 NOTE — Therapy (Signed)
[place], [place], [address] Phone: [phone]   Fax:  [phone]  Physical Therapy Treatment  Patient Details  Name: [name] MRN: [id number] Date of Birth: [birth year] Referring Provider (PT): Birdena Jubilee, PA-C   Encounter Date: 02/26/2020   PT End of Session - 02/26/20 1017    Visit Number 4    Number of Visits 12    Date for PT Re-Evaluation 03/14/20    Authorization Type [employer], recheck FOTO status by visit 6    PT Start Time 1018    PT Stop Time 1107    PT Time Calculation (min) 49 min    Activity Tolerance Patient tolerated treatment well    Behavior During Therapy [facility] for tasks assessed/performed           Past Medical History:  Diagnosis Date  . 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 Chronic idiopathic monocytosis 09/29/2011   10-22%  . Chronic ITP (idiopathic thrombocytopenia) (HCC) 09/28/2011   Dx during pregnancy 12/07-Dr. [name] consulting w/Dr. Gertie Fey for this  . Leukopenia 09/29/2011  . Parotid mass 09/28/2011   S/P excision benign mass R parotid 12/09    Past Surgical History:  Procedure Laterality Date  . CESAREAN SECTION  2008  . LAPAROSCOPIC ASSISTED VAGINAL HYSTERECTOMY N/A 10/25/2012   Procedure: LAPAROSCOPIC ASSISTED VAGINAL HYSTERECTOMY;  Surgeon: Allena Katz, MD;  Location: [facility];  Service: Gynecology;  Laterality: N/A;  with aspiration of left ovarian cyst  . TUBAL LIGATION     Feb 2012    There were no vitals filed for this visit.   Subjective Assessment - 02/26/20 1017    Subjective Sore the day of last session but felt looser in hip after dry needling. Pain 1/10 right hip. Still no significant changes in symptoms.                             [facility] Adult PT Treatment/Exercise - 02/26/20 0001      Lumbar Exercises: Stretches   Passive Hamstring  Stretch Right;2 reps;30 seconds    Piriformis Stretch Right;2 reps;30 seconds      Knee/Hip Exercises: Standing   Functional Squat Limitations TRX hip abduction squat 2x10    Other Standing Knee Exercises reverse lunge with left foot on slider at counter 2x10      Knee/Hip Exercises: Supine   Bridges with Diona Foley Squeeze AROM;Strengthening;Both;15 reps    Bridges with Clamshell AROM;Strengthening;Both;15 reps   blue band   Other Supine Knee/Hip Exercises dying bug level 1 with marches x 20 reps    Other Supine Knee/Hip Exercises alt hip flexion isometrics 3-5 sec holds x 15 ea. bilat.      Manual Therapy   Joint Mobilization Right hip LAD grade I-III oscillations, hip caudal glides and ER mobs with belt grade I-III oscillations            Trigger Point Dry Needling - 02/26/20 0001    Consent Given? Yes    Muscles Treated Back/Hip Gluteus medius;Piriformis;Tensor fascia lata    Dry Needling Comments needling in left sidelying with 30-32 gauge 50 mm needles    Electrical Stimulation Performed with Dry Needling Yes    E-stim with Dry Needling Details TENS 20 pps x 10 minutes  PT Education - 02/26/20 1057    Education Details exercises    Person(s) Educated Patient    Methods Explanation    Comprehension Verbalized understanding               PT Long Term Goals - 02/01/20 1127      PT LONG TERM GOAL #1   Title Independent with HEP    Baseline needs HEP    Time 6    Period Weeks    Status New    Target Date 03/14/20      PT LONG TERM GOAL #2   Title Improve FOTO outcome measure score to 20% or less impairment    Baseline 40% limited    Time 6    Period Weeks    Status New    Target Date 03/14/20      PT LONG TERM GOAL #3   Title Increase right hip strength to 5/5 for abduction and extension to improve hip stability for outdoor ambulation over uneven surfaces and improve ability lifting activities    Baseline 4+/5 abduction, 4/5 extension     Time 6    Period Weeks    Status New    Target Date 03/14/20      PT LONG TERM GOAL #4   Title Tolerate standing/ambulation for community mobility and recreational activities for walking exercise/hiking periods at least 20-30 min with right hip pain <2/10    Time 6    Period Weeks    Status New    Target Date 03/14/20                 Plan - 02/26/20 1104    Clinical Impression Statement Continued previous tx. emphasis with hip mobilization, exercises progression and dry needling-progressed today with standing/closed chain exercises with good tolerance-will await further tx. response by next session for any residual post-tx. soreness. Pain level low today but still not much change in terms of improvement so monitoring of status for progress will be ongoing.    Personal Factors and Comorbidities Other;Comorbidity 1    Comorbidities surgical history, comorbidities with history ITP, AVN    Examination-Activity Limitations Locomotion Level;Stand    Examination-Participation Restrictions Community Activity;Shop    Stability/Clinical Decision Making Stable/Uncomplicated    Clinical Decision Making Low    Rehab Potential Good    PT Frequency --   1-2x/week   PT Duration 6 weeks    PT Treatment/Interventions ADLs/Self Care Home Management;Cryotherapy;Electrical Stimulation;Ultrasound;Iontophoresis 4mg /ml Dexamethasone;Moist Heat;Gait training;Functional mobility training;Therapeutic activities;Neuromuscular re-education;Therapeutic exercise;Patient/family education;Manual techniques;Dry needling;Taping    PT Next Visit Plan Continue hip stabilization/strengthening, manual for gentle hip mobs and STM, further dry needling but monitor bruising    PT Home Exercise Plan Access code: [email or web address]    Consulted and Agree with Plan of Care Patient           Patient will benefit from skilled therapeutic intervention in order to improve the following deficits and impairments:  Pain, Impaired  flexibility, Decreased strength, Difficulty walking  Visit Diagnosis: Pain in right hip  Difficulty in walking, not elsewhere classified     Problem List Patient Active Problem List   Diagnosis Date Noted  . Leukopenia 09/29/2011  . Chronic idiopathic monocytosis 09/29/2011  . Chronic ITP (idiopathic thrombocytopenia) (HCC) 09/28/2011  . 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 Parotid mass 09/28/2011    Beaulah Dinning, PT, DPT 02/26/20 11:29 AM  [facility] [address] [place], [place], [address] Phone: [phone]  Fax:  [phone]  Name: [name] MRN: [id number] Date of Birth: [birth year]

## 2020-03-03 ENCOUNTER — Encounter: Payer: Managed Care, Other (non HMO) | Admitting: Physical Therapy

## 2020-03-06 ENCOUNTER — Ambulatory Visit
Payer: Managed Care, Other (non HMO) | Attending: Rehabilitative and Restorative Service Providers" | Admitting: Physical Therapy

## 2020-03-06 ENCOUNTER — Other Ambulatory Visit: Payer: Self-pay

## 2020-03-06 ENCOUNTER — Encounter: Payer: Self-pay | Admitting: Physical Therapy

## 2020-03-06 DIAGNOSIS — R262 Difficulty in walking, not elsewhere classified: Secondary | ICD-10-CM | POA: Insufficient documentation

## 2020-03-06 DIAGNOSIS — M25551 Pain in right hip: Secondary | ICD-10-CM

## 2020-03-06 NOTE — Therapy (Signed)
[place], [place], [address] Phone: [phone]   Fax:  [phone]  Physical Therapy Treatment  Patient Details  Name: [name] MRN: [id number] Date of Birth: [birth year] Referring Provider (PT): Birdena Jubilee, PA-C   Encounter Date: 03/06/2020   PT End of Session - 03/06/20 1013    Visit Number 5    Number of Visits 12    Date for PT Re-Evaluation 03/14/20    Authorization Type [employer], recheck FOTO status by visit 6    PT Start Time 1014    PT Stop Time 1059    PT Time Calculation (min) 45 min    Activity Tolerance Patient tolerated treatment well    Behavior During Therapy [facility] for tasks assessed/performed           Past Medical History:  Diagnosis Date  . 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 Chronic idiopathic monocytosis 09/29/2011   10-22%  . Chronic ITP (idiopathic thrombocytopenia) (HCC) 09/28/2011   Dx during pregnancy 12/07-Dr. [name] consulting w/Dr. Gertie Fey for this  . Leukopenia 09/29/2011  . Parotid mass 09/28/2011   S/P excision benign mass R parotid 12/09    Past Surgical History:  Procedure Laterality Date  . CESAREAN SECTION  2008  . LAPAROSCOPIC ASSISTED VAGINAL HYSTERECTOMY N/A 10/25/2012   Procedure: LAPAROSCOPIC ASSISTED VAGINAL HYSTERECTOMY;  Surgeon: Allena Katz, MD;  Location: [facility];  Service: Gynecology;  Laterality: N/A;  with aspiration of left ovarian cyst  . TUBAL LIGATION     Feb 2012    There were no vitals filed for this visit.   Subjective Assessment - 03/06/20 1246    Subjective Mild improvement in hip with less soreness after activity. No significant new bruising noted after dry needling last session. No pain pre-tx. and no recent lateral hip pain symptoms See assessment/plan.    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 reports blood/platelets stable    Limitations Standing;Walking    Diagnostic tests X-rays    Patient Stated Goals Make sure that hip pain does not flare up again as it did during trip    Currently in Pain? No/denies                             [facility] Adult PT Treatment/Exercise - 03/06/20 0001      Lumbar Exercises: Supine   Pelvic Tilt 10 reps    Pelvic Tilt Limitations with therapist LAD right hip      Knee/Hip Exercises: Standing   Other Standing Knee Exercises Monster walk x 20 feet green band, lateral stepping with green band at ankles x 20 feet      Knee/Hip Exercises: Seated   Other Seated Knee/Hip Exercises seated hip ER with green band 2x10      Knee/Hip Exercises: Supine   Bridges Limitations P-ball hip bridge x 10 calves on ball, x 10 with feet on ball   55 cm P-ball   Straight Leg Raises AROM;Strengthening;Right;10 reps      Knee/Hip Exercises: Sidelying   Clams Green Theraband 2x10      Manual Therapy   Joint Mobilization Right hip LAD grade I-III oscillations, hip caudal glides and ER mobs with belt grade I-III oscillations    Soft tissue mobilization IASTM/foam roll  right lateral hip region                  PT Education - 03/06/20 1250    Education Details HEP updates, POC    Person(s) Educated Patient    Methods Explanation;Demonstration;Verbal cues;Handout    Comprehension Verbalized understanding;Returned demonstration               PT Long Term Goals - 02/01/20 1127      PT LONG TERM GOAL #1   Title Independent with HEP    Baseline needs HEP    Time 6    Period Weeks    Status New    Target Date 03/14/20      PT LONG TERM GOAL #2   Title Improve FOTO outcome measure score to 20% or less impairment    Baseline 40% limited    Time 6    Period Weeks    Status New    Target Date 03/14/20      PT LONG TERM GOAL #3   Title Increase right hip strength to 5/5 for abduction and extension to  improve hip stability for outdoor ambulation over uneven surfaces and improve ability lifting activities    Baseline 4+/5 abduction, 4/5 extension    Time 6    Period Weeks    Status New    Target Date 03/14/20      PT LONG TERM GOAL #4   Title Tolerate standing/ambulation for community mobility and recreational activities for walking exercise/hiking periods at least 20-30 min with right hip pain <2/10    Time 6    Period Weeks    Status New    Target Date 03/14/20                 Plan - 03/06/20 1032    Clinical Impression Statement Continued previous manual therapy for hip mobilizations and for exercises focuses on hip stabilization progression in closed and open chain. At this point pt. is independent with HEP including exercise additions today and expect that she can continue progress independently. Plan have her try HEP for the next couple of weeks and will hold off on discharge from therapy in case of need for return for further manual therapy, dry needling or exercise progression/updates otherwise would plan d/c with pt. to follow up with MD if experiencing any changes in status or future symptom exacerbations for further tx. options.    Personal Factors and Comorbidities Other;Comorbidity 1    Comorbidities surgical history, comorbidities with history ITP, AVN    Examination-Activity Limitations Locomotion Level;Stand    Examination-Participation Restrictions Community Activity;Shop    Stability/Clinical Decision Making Stable/Uncomplicated    Clinical Decision Making Low    Rehab Potential Good    PT Frequency --   1-2x/week   PT Duration 6 weeks    PT Treatment/Interventions ADLs/Self Care Home Management;Cryotherapy;Electrical Stimulation;Ultrasound;Iontophoresis 4mg /ml Dexamethasone;Moist Heat;Gait training;Functional mobility training;Therapeutic activities;Neuromuscular re-education;Therapeutic exercise;Patient/family education;Manual techniques;Dry needling;Taping     PT Next Visit Plan Pt. to try continuation via HEP with return only if needed for further manual therapy, dry needling or exercise progression    PT Home Exercise Plan Access code: [email or web address]    Consulted and Agree with Plan of Care Patient           Patient will benefit from skilled therapeutic intervention in order to improve the following deficits and impairments:  Pain, Impaired flexibility, Decreased strength, Difficulty walking  Visit Diagnosis: Pain in right hip  Difficulty in walking,  not elsewhere classified     Problem List Patient Active Problem List   Diagnosis Date Noted  . Leukopenia 09/29/2011  . Chronic idiopathic monocytosis 09/29/2011  . Chronic ITP (idiopathic thrombocytopenia) (HCC) 09/28/2011  . 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 Parotid mass 09/28/2011    Beaulah Dinning, PT, DPT 03/06/20 12:58 PM  [place] [facility] [address] [place], [place], [address] Phone: [phone]   Fax:  [phone]  Name: [name] MRN: [id number] Date of Birth: [birth year]

## 2020-04-17 NOTE — Therapy (Signed)
[place], [place], [address] Phone: [phone]   Fax:  [phone]  Physical Therapy Treatment/Discharge  Patient Details  Name: [name] MRN: [id number] Date of Birth: 29-Jan-1976 Referring Provider (PT): Birdena Jubilee, PA-C   Encounter Date: 03/06/2020    Past Medical History:  Diagnosis Date  . 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 Chronic idiopathic monocytosis 09/29/2011   10-22%  . Chronic ITP (idiopathic thrombocytopenia) (HCC) 09/28/2011   Dx during pregnancy 12/07-Dr. [name] consulting w/Dr. Gertie Fey for this  . Leukopenia 09/29/2011  . Parotid mass 09/28/2011   S/P excision benign mass R parotid 12/09    Past Surgical History:  Procedure Laterality Date  . CESAREAN SECTION  2008  . LAPAROSCOPIC ASSISTED VAGINAL HYSTERECTOMY N/A 10/25/2012   Procedure: LAPAROSCOPIC ASSISTED VAGINAL HYSTERECTOMY;  Surgeon: Allena Katz, MD;  Location: [facility];  Service: Gynecology;  Laterality: N/A;  with aspiration of left ovarian cyst  . TUBAL LIGATION     Feb 2012    There were no vitals filed for this visit.                                   PT Long Term Goals - 02/01/20 1127      PT LONG TERM GOAL #1   Title Independent with HEP    Baseline needs HEP    Time 6    Period Weeks    Status New    Target Date 03/14/20      PT LONG TERM GOAL #2   Title Improve FOTO outcome measure score to 20% or less impairment    Baseline 40% limited    Time 6    Period Weeks    Status New    Target Date 03/14/20      PT LONG TERM GOAL #3   Title Increase right hip strength to 5/5 for abduction and extension to improve hip stability for outdoor ambulation over uneven surfaces and improve ability lifting activities    Baseline 4+/5 abduction, 4/5 extension    Time 6    Period Weeks    Status  New    Target Date 03/14/20      PT LONG TERM GOAL #4   Title Tolerate standing/ambulation for community mobility and recreational activities for walking exercise/hiking periods at least 20-30 min with right hip pain <2/10    Time 6    Period Weeks    Status New    Target Date 03/14/20                  Patient will benefit from skilled therapeutic intervention in order to improve the following deficits and impairments:  Pain, Impaired flexibility, Decreased strength, Difficulty walking  Visit Diagnosis: Pain in right hip  Difficulty in walking, not elsewhere classified     Problem List Patient Active Problem List   Diagnosis Date Noted  . Leukopenia 09/29/2011  . Chronic idiopathic monocytosis 09/29/2011  . Chronic ITP (idiopathic thrombocytopenia) (HCC) 09/28/2011  . 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 Parotid mass 09/28/2011       PHYSICAL THERAPY DISCHARGE SUMMARY  Visits from Start of Care: 5  Current functional level related to goals / functional outcomes: Patient last seen for therapy 03/06/20-at the time plan was to  try continuing via HEP and return only if needed. No further visits planned at this time so recommend continue HEP and follow up with MD if having any future changes in status.   Remaining deficits: Right hip pain, muscle weakness and mild stiffness   Education / Equipment: HEP  Plan: Patient agrees to discharge.  Patient goals were partially met. Patient is being discharged due to meeting the stated rehab goals.  ?????           Beaulah Dinning, PT, DPT 04/17/20 2:36 PM      [facility] [address] [place], [place], [address] Phone: [phone]   Fax:  [phone]  Name: [name] MRN: [id number] Date of Birth: 12/03/1975

## 2020-06-11 DIAGNOSIS — Z1211 Encounter for screening for malignant neoplasm of colon: Secondary | ICD-10-CM | POA: Diagnosis not present

## 2020-06-11 DIAGNOSIS — Z01818 Encounter for other preprocedural examination: Secondary | ICD-10-CM | POA: Diagnosis not present

## 2020-06-17 DIAGNOSIS — F411 Generalized anxiety disorder: Secondary | ICD-10-CM | POA: Diagnosis not present

## 2020-07-16 DIAGNOSIS — L570 Actinic keratosis: Secondary | ICD-10-CM | POA: Diagnosis not present

## 2020-07-17 DIAGNOSIS — L570 Actinic keratosis: Secondary | ICD-10-CM | POA: Diagnosis not present

## 2020-07-31 DIAGNOSIS — Z01812 Encounter for preprocedural laboratory examination: Secondary | ICD-10-CM | POA: Diagnosis not present

## 2020-08-05 DIAGNOSIS — K640 First degree hemorrhoids: Secondary | ICD-10-CM | POA: Diagnosis not present

## 2020-08-05 DIAGNOSIS — Z1211 Encounter for screening for malignant neoplasm of colon: Secondary | ICD-10-CM | POA: Diagnosis not present

## 2020-08-05 DIAGNOSIS — Z800 Family history of malignant neoplasm of digestive organs: Secondary | ICD-10-CM | POA: Diagnosis not present

## 2020-08-12 DIAGNOSIS — F411 Generalized anxiety disorder: Secondary | ICD-10-CM | POA: Diagnosis not present

## 2020-09-03 DIAGNOSIS — F411 Generalized anxiety disorder: Secondary | ICD-10-CM | POA: Diagnosis not present

## 2020-10-01 DIAGNOSIS — F411 Generalized anxiety disorder: Secondary | ICD-10-CM | POA: Diagnosis not present

## 2020-10-11 DIAGNOSIS — R509 Fever, unspecified: Secondary | ICD-10-CM | POA: Diagnosis not present

## 2020-10-11 DIAGNOSIS — J029 Acute pharyngitis, unspecified: Secondary | ICD-10-CM | POA: Diagnosis not present

## 2020-10-11 DIAGNOSIS — J0100 Acute maxillary sinusitis, unspecified: Secondary | ICD-10-CM | POA: Diagnosis not present

## 2020-10-11 DIAGNOSIS — U071 COVID-19: Secondary | ICD-10-CM | POA: Diagnosis not present

## 2020-10-30 DIAGNOSIS — Z1231 Encounter for screening mammogram for malignant neoplasm of breast: Secondary | ICD-10-CM | POA: Diagnosis not present

## 2020-10-30 DIAGNOSIS — Z6820 Body mass index (BMI) 20.0-20.9, adult: Secondary | ICD-10-CM | POA: Diagnosis not present

## 2020-10-30 DIAGNOSIS — Z01419 Encounter for gynecological examination (general) (routine) without abnormal findings: Secondary | ICD-10-CM | POA: Diagnosis not present

## 2020-11-24 DIAGNOSIS — D485 Neoplasm of uncertain behavior of skin: Secondary | ICD-10-CM | POA: Diagnosis not present

## 2020-11-24 DIAGNOSIS — C4441 Basal cell carcinoma of skin of scalp and neck: Secondary | ICD-10-CM | POA: Diagnosis not present

## 2020-11-24 DIAGNOSIS — L570 Actinic keratosis: Secondary | ICD-10-CM | POA: Diagnosis not present

## 2020-12-17 DIAGNOSIS — C4441 Basal cell carcinoma of skin of scalp and neck: Secondary | ICD-10-CM | POA: Diagnosis not present

## 2021-01-25 DIAGNOSIS — R5383 Other fatigue: Secondary | ICD-10-CM | POA: Diagnosis not present

## 2021-01-25 DIAGNOSIS — R6883 Chills (without fever): Secondary | ICD-10-CM | POA: Diagnosis not present

## 2021-01-25 DIAGNOSIS — Z20822 Contact with and (suspected) exposure to covid-19: Secondary | ICD-10-CM | POA: Diagnosis not present

## 2021-01-25 DIAGNOSIS — R0981 Nasal congestion: Secondary | ICD-10-CM | POA: Diagnosis not present

## 2021-01-29 DIAGNOSIS — J069 Acute upper respiratory infection, unspecified: Secondary | ICD-10-CM | POA: Diagnosis not present

## 2021-02-06 DIAGNOSIS — D225 Melanocytic nevi of trunk: Secondary | ICD-10-CM | POA: Diagnosis not present

## 2021-02-06 DIAGNOSIS — L578 Other skin changes due to chronic exposure to nonionizing radiation: Secondary | ICD-10-CM | POA: Diagnosis not present

## 2021-03-25 DIAGNOSIS — Z79899 Other long term (current) drug therapy: Secondary | ICD-10-CM | POA: Diagnosis not present

## 2021-03-25 DIAGNOSIS — Z862 Personal history of diseases of the blood and blood-forming organs and certain disorders involving the immune mechanism: Secondary | ICD-10-CM | POA: Diagnosis not present

## 2021-03-25 DIAGNOSIS — E7800 Pure hypercholesterolemia, unspecified: Secondary | ICD-10-CM | POA: Diagnosis not present

## 2021-03-25 DIAGNOSIS — Z0000 Encounter for general adult medical examination without abnormal findings: Secondary | ICD-10-CM | POA: Diagnosis not present

## 2021-03-25 DIAGNOSIS — L709 Acne, unspecified: Secondary | ICD-10-CM | POA: Diagnosis not present

## 2021-06-24 DIAGNOSIS — H16223 Keratoconjunctivitis sicca, not specified as Sjogren's, bilateral: Secondary | ICD-10-CM | POA: Diagnosis not present

## 2021-06-24 DIAGNOSIS — H5203 Hypermetropia, bilateral: Secondary | ICD-10-CM | POA: Diagnosis not present

## 2021-06-24 DIAGNOSIS — H10413 Chronic giant papillary conjunctivitis, bilateral: Secondary | ICD-10-CM | POA: Diagnosis not present

## 2021-06-24 DIAGNOSIS — H52203 Unspecified astigmatism, bilateral: Secondary | ICD-10-CM | POA: Diagnosis not present

## 2021-08-21 DIAGNOSIS — L570 Actinic keratosis: Secondary | ICD-10-CM | POA: Diagnosis not present

## 2021-08-21 DIAGNOSIS — D2261 Melanocytic nevi of right upper limb, including shoulder: Secondary | ICD-10-CM | POA: Diagnosis not present

## 2021-08-21 DIAGNOSIS — D225 Melanocytic nevi of trunk: Secondary | ICD-10-CM | POA: Diagnosis not present

## 2021-08-21 DIAGNOSIS — D485 Neoplasm of uncertain behavior of skin: Secondary | ICD-10-CM | POA: Diagnosis not present

## 2021-08-21 DIAGNOSIS — D224 Melanocytic nevi of scalp and neck: Secondary | ICD-10-CM | POA: Diagnosis not present

## 2021-08-21 DIAGNOSIS — L578 Other skin changes due to chronic exposure to nonionizing radiation: Secondary | ICD-10-CM | POA: Diagnosis not present

## 2021-10-15 DIAGNOSIS — D485 Neoplasm of uncertain behavior of skin: Secondary | ICD-10-CM | POA: Diagnosis not present

## 2021-10-15 DIAGNOSIS — C4441 Basal cell carcinoma of skin of scalp and neck: Secondary | ICD-10-CM | POA: Diagnosis not present

## 2021-11-02 DIAGNOSIS — Z1231 Encounter for screening mammogram for malignant neoplasm of breast: Secondary | ICD-10-CM | POA: Diagnosis not present

## 2021-11-02 DIAGNOSIS — Z01419 Encounter for gynecological examination (general) (routine) without abnormal findings: Secondary | ICD-10-CM | POA: Diagnosis not present

## 2021-11-02 DIAGNOSIS — Z6821 Body mass index (BMI) 21.0-21.9, adult: Secondary | ICD-10-CM | POA: Diagnosis not present

## 2021-11-05 ENCOUNTER — Other Ambulatory Visit: Payer: Self-pay | Admitting: Obstetrics and Gynecology

## 2021-11-05 DIAGNOSIS — R928 Other abnormal and inconclusive findings on diagnostic imaging of breast: Secondary | ICD-10-CM

## 2021-11-13 ENCOUNTER — Other Ambulatory Visit: Payer: Self-pay | Admitting: Obstetrics and Gynecology

## 2021-11-13 ENCOUNTER — Ambulatory Visit
Admission: RE | Admit: 2021-11-13 | Discharge: 2021-11-13 | Disposition: A | Discharge status: Home / Self Care | Payer: BC Managed Care – PPO | Source: Ambulatory Visit | Attending: Obstetrics and Gynecology | Admitting: Obstetrics and Gynecology

## 2021-11-13 DIAGNOSIS — R928 Other abnormal and inconclusive findings on diagnostic imaging of breast: Secondary | ICD-10-CM

## 2021-11-13 DIAGNOSIS — N6310 Unspecified lump in the right breast, unspecified quadrant: Secondary | ICD-10-CM

## 2021-11-13 DIAGNOSIS — C50411 Malignant neoplasm of upper-outer quadrant of right female breast: Secondary | ICD-10-CM | POA: Diagnosis not present

## 2021-11-13 DIAGNOSIS — R921 Mammographic calcification found on diagnostic imaging of breast: Secondary | ICD-10-CM | POA: Diagnosis not present

## 2021-11-13 DIAGNOSIS — N6311 Unspecified lump in the right breast, upper outer quadrant: Secondary | ICD-10-CM | POA: Diagnosis not present

## 2021-11-13 HISTORY — PX: BREAST BIOPSY: SHX20

## 2021-11-18 ENCOUNTER — Telehealth: Payer: Self-pay | Admitting: Hematology

## 2021-11-18 NOTE — Telephone Encounter (Signed)
Spoke to patient to confirm morning clinic for 7/5, paperwork will be sent via e-mail

## 2021-11-26 ENCOUNTER — Encounter: Payer: Self-pay | Admitting: *Deleted

## 2021-11-26 DIAGNOSIS — C50411 Malignant neoplasm of upper-outer quadrant of right female breast: Secondary | ICD-10-CM | POA: Insufficient documentation

## 2021-11-30 NOTE — Progress Notes (Signed)
[place] NOTE  Patient Care Team: Gaynelle Arabian, MD as PCP - General (Family Medicine) Rockwell Germany, RN as Oncology Nurse Navigator Mauro Kaufmann, RN as Oncology Nurse Navigator Coralie Keens, MD as Consulting Physician (General Surgery) Nicholas Lose, MD as Consulting Physician (Hematology and Oncology) Kyung Rudd, MD as Consulting Physician (Radiation Oncology)  CHIEF COMPLAINTS/PURPOSE OF CONSULTATION:  Newly diagnosed breast cancer  HISTORY OF PRESENTING ILLNESS:  [name] 46 y.o. female is here because of recent diagnosis of right breast Screening mammogram on 11/13/2021 detected Recall from screening mammography, possible asymmetry in the upper RIGHT breast at far posterior depth visible only on the MLO view and separate group of calcifications involving the outer RIGHT breast at posterior depth Diagnostic mammogram on 11/13/2021 showed 1. Suspicious 0.7 cm mass in the UPPER OUTER QUADRANT of the RIGHT breast at the 10 o'clock position 9 cm from the nipple which accounts for the screening mammographic asymmetry associated with distortion. 2. No pathologic RIGHT axillary lymphadenopathy. 3. Benign 3 mm group of calcifications in the LOWER OUTER QUADRANT (benign milk of calcium). Biopsy on the date of *** showed: ***. Prognostic indicators significant for ER ***; PR ***, Her2 status ***; Grade ***.    I reviewed her records extensively and collaborated the history with the patient.  SUMMARY OF ONCOLOGIC HISTORY: Oncology History   No history exists.     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 [name] consulting w/Dr. Gertie Fey for this   Leukopenia  09/29/2011   Parotid mass 09/28/2011   S/P excision benign mass R parotid 12/09    SURGICAL HISTORY: Past Surgical History:  Procedure Laterality Date   CESAREAN SECTION  2008   LAPAROSCOPIC ASSISTED VAGINAL HYSTERECTOMY N/A 10/25/2012   Procedure: LAPAROSCOPIC ASSISTED VAGINAL HYSTERECTOMY;  Surgeon: Allena Katz, MD;  Location: [facility];  Service: Gynecology;  Laterality: N/A;  with aspiration of left ovarian cyst   TUBAL LIGATION     Feb 2012    SOCIAL HISTORY: Social History   Socioeconomic History   Marital status: Married    Spouse name: Not on file   Number of children: Not on file   Years of education: Not on file   Highest education level: Not on file  Occupational History   Not on file  Tobacco Use   Smoking status: Never   Smokeless tobacco: Never  Substance and Sexual Activity   Alcohol use: Yes    Comment: occasionally   Drug use: No   Sexual activity: Not on file    Comment: s/p tubal ligation  Other Topics Concern   Not on file  Social History Narrative   Not on file   Social Determinants of Health   Financial Resource Strain: Not on file  Food Insecurity: Not on file  Transportation Needs: Not on file  Physical Activity: Not on file  Stress: Not on file  Social Connections: Not on file  Intimate Partner Violence: Not on file    FAMILY HISTORY: No family history on file.  ALLERGIES:  has No Known Allergies.  MEDICATIONS:  Current Outpatient Medications  Medication Sig Dispense Refill   bacitracin ointment Apply 1 application topically 2 (two)  times daily. (Patient not taking: Reported on 02/01/2020) 28 g 1   naproxen (NAPROSYN) 250 MG tablet Take 1 tablet (250 mg total) by mouth 2 (two) times daily with a meal. (Patient not taking: Reported on 02/01/2020) 30 tablet 0   spironolactone (ALDACTONE) 25 MG tablet Take 75 mg by mouth daily.     No current facility-administered medications for this visit.    REVIEW OF SYSTEMS:   Constitutional:  Denies fevers, chills or abnormal night sweats Eyes: Denies blurriness of vision, double vision or watery eyes Ears, nose, mouth, throat, and face: Denies mucositis or sore throat Respiratory: Denies cough, dyspnea or wheezes Cardiovascular: Denies palpitation, chest discomfort or lower extremity swelling Gastrointestinal:  Denies nausea, heartburn or change in bowel habits Skin: Denies abnormal skin rashes Lymphatics: Denies new lymphadenopathy or easy bruising Neurological:Denies numbness, tingling or new weaknesses Behavioral/Psych: Mood is stable, no new changes  Breast: *** Denies any palpable lumps or discharge All other systems were reviewed with the patient and are negative.  PHYSICAL EXAMINATION: ECOG PERFORMANCE STATUS: {CHL ONC ECOG PS:[id number]}  There were no vitals filed for this visit. There were no vitals filed for this visit.  GENERAL:alert, no distress and comfortable SKIN: skin color, texture, turgor are normal, no rashes or significant lesions EYES: normal, conjunctiva are pink and non-injected, sclera clear OROPHARYNX:no exudate, no erythema and lips, buccal mucosa, and tongue normal  NECK: supple, thyroid normal size, non-tender, without nodularity LYMPH:  no palpable lymphadenopathy in the cervical, axillary or inguinal LUNGS: clear to auscultation and percussion with normal breathing effort HEART: regular rate & rhythm and no murmurs and no lower extremity edema ABDOMEN:abdomen soft, non-tender and normal bowel sounds Musculoskeletal:no cyanosis of digits and no clubbing  PSYCH: alert & oriented x 3 with fluent speech NEURO: no focal motor/sensory deficits BREAST:*** No palpable nodules in breast. No palpable axillary or supraclavicular lymphadenopathy (exam performed in the presence of a chaperone)   LABORATORY DATA:  I have reviewed the data as listed Lab Results  Component Value Date   WBC 4.3 11/11/2014   HGB 14.3 11/11/2014   HCT 42.5 11/11/2014    MCV 89.7 11/11/2014   PLT 205 11/11/2014   Lab Results  Component Value Date   NA 136 10/20/2012   K 4.3 10/20/2012   CL 104 10/20/2012   CO2 26 10/20/2012    RADIOGRAPHIC STUDIES: I have personally reviewed the radiological reports and agreed with the findings in the report.  ASSESSMENT AND PLAN:  No problem-specific Assessment & Plan notes found for this encounter.   All questions were answered. The patient knows to call the clinic with any problems, questions or concerns.    [place], CMA 11/30/21  I Gardiner Coins am scribing for Dr. Lindi Adie  ***

## 2021-12-02 ENCOUNTER — Other Ambulatory Visit: Payer: Self-pay

## 2021-12-02 ENCOUNTER — Inpatient Hospital Stay (HOSPITAL_BASED_OUTPATIENT_CLINIC_OR_DEPARTMENT_OTHER): Payer: BC Managed Care – PPO | Admitting: Genetic Counselor

## 2021-12-02 ENCOUNTER — Ambulatory Visit: Payer: BC Managed Care – PPO | Attending: Surgery | Admitting: Physical Therapy

## 2021-12-02 ENCOUNTER — Inpatient Hospital Stay: Payer: BC Managed Care – PPO

## 2021-12-02 ENCOUNTER — Encounter: Payer: Self-pay | Admitting: Genetic Counselor

## 2021-12-02 ENCOUNTER — Ambulatory Visit
Admission: RE | Admit: 2021-12-02 | Discharge: 2021-12-02 | Disposition: A | Discharge status: Home / Self Care | Payer: BC Managed Care – PPO | Source: Ambulatory Visit | Attending: Radiation Oncology | Admitting: Radiation Oncology

## 2021-12-02 ENCOUNTER — Other Ambulatory Visit: Payer: Self-pay | Admitting: Surgery

## 2021-12-02 ENCOUNTER — Inpatient Hospital Stay: Payer: BC Managed Care – PPO | Admitting: Licensed Clinical Social Worker

## 2021-12-02 ENCOUNTER — Encounter: Payer: Self-pay | Admitting: *Deleted

## 2021-12-02 ENCOUNTER — Inpatient Hospital Stay: Payer: BC Managed Care – PPO | Attending: Hematology and Oncology | Admitting: Hematology and Oncology

## 2021-12-02 ENCOUNTER — Encounter: Payer: Self-pay | Admitting: Physical Therapy

## 2021-12-02 DIAGNOSIS — Z170 Estrogen receptor positive status [ER+]: Secondary | ICD-10-CM

## 2021-12-02 DIAGNOSIS — C50411 Malignant neoplasm of upper-outer quadrant of right female breast: Secondary | ICD-10-CM

## 2021-12-02 DIAGNOSIS — Z800 Family history of malignant neoplasm of digestive organs: Secondary | ICD-10-CM | POA: Diagnosis not present

## 2021-12-02 DIAGNOSIS — R293 Abnormal posture: Secondary | ICD-10-CM | POA: Insufficient documentation

## 2021-12-02 DIAGNOSIS — C50911 Malignant neoplasm of unspecified site of right female breast: Secondary | ICD-10-CM | POA: Diagnosis not present

## 2021-12-02 DIAGNOSIS — Z853 Personal history of malignant neoplasm of breast: Secondary | ICD-10-CM

## 2021-12-02 LAB — CBC WITH DIFFERENTIAL (CANCER CENTER ONLY)
Abs Immature Granulocytes: 0.01 10*3/uL (ref 0.00–0.07)
Basophils Absolute: 0 10*3/uL (ref 0.0–0.1)
Basophils Relative: 1 %
Eosinophils Absolute: 0.1 10*3/uL (ref 0.0–0.5)
Eosinophils Relative: 1 %
HCT: 41.6 % (ref 36.0–46.0)
Hemoglobin: 14 g/dL (ref 12.0–15.0)
Immature Granulocytes: 0 %
Lymphocytes Relative: 21 %
Lymphs Abs: 1 10*3/uL (ref 0.7–4.0)
MCH: 31 pg (ref 26.0–34.0)
MCHC: 33.7 g/dL (ref 30.0–36.0)
MCV: 92 fL (ref 80.0–100.0)
Monocytes Absolute: 0.7 10*3/uL (ref 0.1–1.0)
Monocytes Relative: 14 %
Neutro Abs: 3 10*3/uL (ref 1.7–7.7)
Neutrophils Relative %: 63 %
Platelet Count: 244 10*3/uL (ref 150–400)
RBC: 4.52 MIL/uL (ref 3.87–5.11)
RDW: 13.2 % (ref 11.5–15.5)
WBC Count: 4.8 10*3/uL (ref 4.0–10.5)
nRBC: 0 % (ref 0.0–0.2)

## 2021-12-02 LAB — CMP (CANCER CENTER ONLY)
ALT: 12 U/L (ref 0–44)
AST: 18 U/L (ref 15–41)
Albumin: 3.8 g/dL (ref 3.5–5.0)
Alkaline Phosphatase: 53 U/L (ref 38–126)
Anion gap: 5 (ref 5–15)
BUN: 10 mg/dL (ref 6–20)
CO2: 25 mmol/L (ref 22–32)
Calcium: 9.4 mg/dL (ref 8.9–10.3)
Chloride: 107 mmol/L (ref 98–111)
Creatinine: 0.88 mg/dL (ref 0.44–1.00)
GFR, Estimated: >60 mL/min (ref >60)
Glucose, Bld: 84 mg/dL (ref 70–99)
Potassium: 4.1 mmol/L (ref 3.5–5.1)
Sodium: 137 mmol/L (ref 135–145)
Total Bilirubin: 0.5 mg/dL (ref 0.3–1.2)
Total Protein: 6.5 g/dL (ref 6.5–8.1)

## 2021-12-02 LAB — RESEARCH LABS

## 2021-12-02 LAB — GENETIC SCREENING ORDER

## 2021-12-02 NOTE — Progress Notes (Signed)
[place] Work  Initial Assessment   [name] is a 46 y.o. year old female accompanied by spouse (Forrest). Clinical Social Work was referred by  [facility]  for assessment of psychosocial needs.   SDOH (Social Determinants of Health) assessments performed: Yes SDOH Interventions    Flowsheet Row Most Recent Value  SDOH Interventions   Food Insecurity Interventions Intervention Not Indicated  Financial Strain Interventions Intervention Not Indicated  Housing Interventions Intervention Not Indicated  Transportation Interventions Intervention Not Indicated       SDOH Screenings   Alcohol Screen: Not on file  Depression (BZJ6-9): Not on file  Financial Resource Strain: Low Risk  (12/02/2021)   Overall Financial Resource Strain (CARDIA)    Difficulty of Paying Living Expenses: Not hard at all  Food Insecurity: No Food Insecurity (12/02/2021)   Hunger Vital Sign    Worried About Running Out of Food in the Last Year: Never true    Ran Out of Food in the Last Year: Never true  Housing: Low Risk  (12/02/2021)   Housing    Last Housing Risk Score: 0  Physical Activity: Not on file  Social Connections: Not on file  Stress: Not on file  Tobacco Use: Low Risk  (12/02/2021)   Patient History    Smoking Tobacco Use: Never    Smokeless Tobacco Use: Never    Passive Exposure: Not on file  Transportation Needs: No Transportation Needs (12/02/2021)   PRAPARE - Transportation    Lack of Transportation (Medical): No    Lack of Transportation (Non-Medical): No     Distress Screen completed: Yes    12/02/2021   11:51 AM  ONCBCN DISTRESS SCREENING  Screening Type Initial Screening  Distress experienced in past week (1-10) 2      Family/Social Information:  Housing Arrangement: patient lives with husband, 3 children (4, 15 & 79 yo) Family members/support persons in your life? Family and Colleagues Transportation concerns: no  Employment: Working full time as an Forensic psychologist.   Income source: Employment Financial concerns: No Type of concern: None Food access concerns: no Religious or spiritual practice: Not known Services Currently in place:  n/a  Coping/ Adjustment to diagnosis: Patient understands treatment plan and what happens next? yes Concerns about diagnosis and/or treatment:  general adjustment Patient reported stressors: Adjusting to my illness Patient enjoys exercise and reading Current coping skills/ strengths: Capable of independent living , Communication skills , and Supportive family/friends     SUMMARY: Current SDOH Barriers:  No SDOH barriers identified today  Clinical Social Work Clinical Goal(s):  No clinical social work goals at this time  Interventions: Discussed common feeling and emotions when being diagnosed with cancer, and the importance of support during treatment Informed patient of the support team roles and support services at [facility] Provided [place] contact information and encouraged patient to call with any questions or concerns   Follow Up Plan: Patient will contact CSW with any support or resource needs Patient verbalizes understanding of plan: Yes    Kyzer Blowe E Nathanael Krist, LCSW

## 2021-12-02 NOTE — Progress Notes (Signed)
Radiation Oncology         ([phone]) [phone] ________________________________  Name: [name] MRN: [id number]  Date: 12/02/2021  DOB: 1975-11-02  CC:Gaynelle Arabian, MD  Coralie Keens, MD     REFERRING PHYSICIAN: Coralie Keens, MD   DIAGNOSIS: The encounter diagnosis was Malignant neoplasm of upper-outer quadrant of right breast in female, estrogen receptor positive ([place]).   HISTORY OF PRESENT ILLNESS::[name] is a 46 y.o. female who is seen for an initial consultation visit regarding the patient's diagnosis of right-sided breast cancer.  The patient was found to have some calcifications and architectural Duralde change on recent screening mammogram.  Ultrasound revealed a 0.7 cm corresponding tumor.  No axillary adenopathy was seen on the right.  Biopsy of the suspicious right breast mass revealed a grade 1 invasive ductal carcinoma.  Receptor studies indicated that the tumor is estrogen receptor positive, progesterone receptor positive, and HER2/neu negative.  The Ki-67 staining was 10%.  The patient is seen today in multidisciplinary breast clinic.      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     PAST SURGICAL HISTORY: Past Surgical History:  Procedure Laterality Date   CESAREAN SECTION  2008   LAPAROSCOPIC ASSISTED VAGINAL HYSTERECTOMY N/A 10/25/2012   Procedure: LAPAROSCOPIC ASSISTED VAGINAL HYSTERECTOMY;  Surgeon: Allena Katz, MD;  Location: [facility];  Service: Gynecology;  Laterality: N/A;  with aspiration of left ovarian cyst   TUBAL LIGATION     Feb 2012     FAMILY HISTORY: family history includes Colon cancer (age of onset: 55) in her mother.   SOCIAL HISTORY:  reports that she has never smoked. She has  never used smokeless tobacco. She reports current alcohol use. She reports that she does not use drugs.   ALLERGIES: Amoxicillin and Other   MEDICATIONS:  Current Outpatient Medications  Medication Sig Dispense Refill   cholecalciferol (VITAMIN D3) 25 MCG (1000 UNIT) tablet Take 1,000 Units by mouth daily.     Multiple Vitamin (MULTIVITAMIN WITH MINERALS) TABS tablet Take 1 tablet by mouth daily.     spironolactone (ALDACTONE) 25 MG tablet Take 75 mg by mouth daily.     No current facility-administered medications for this encounter.     REVIEW OF SYSTEMS:  A 15 point review of systems is documented in the electronic medical record. This was obtained by the nursing staff. However, I reviewed this with the patient to discuss relevant findings and make appropriate changes.  Pertinent items are noted in HPI.    PHYSICAL EXAM:  vitals were not taken for this visit.   ECOG = 0  0 - Asymptomatic (Fully active, able to carry on all predisease activities without restriction)  1 - Symptomatic but completely ambulatory (Restricted in physically strenuous activity but ambulatory and able to carry out work of a light or sedentary nature. For example, light housework, office work)  2 - Symptomatic, <50% in bed during the day (Ambulatory and capable of all self care but unable to carry out any work activities. Up and about more than 50% of waking hours)  3 - Symptomatic, >50% in bed, but not bedbound (Capable of only limited self-care, confined to bed or chair 50% or more of waking hours)  4 - Bedbound (Completely disabled. Cannot carry on any self-care. Totally confined  to bed or chair)  5 - Death   Eustace Pen MM, Creech RH, Tormey DC, et al. [phone]). "Toxicity and response criteria of the [facility] [employer]". [place] Oncol. 5 (6): 649-55  Alert, no acute distress   LABORATORY DATA:  Lab Results  Component Value Date   WBC 4.8 12/02/2021   HGB 14.0 12/02/2021   HCT 41.6  12/02/2021   MCV 92.0 12/02/2021   PLT 244 12/02/2021   Lab Results  Component Value Date   NA 137 12/02/2021   K 4.1 12/02/2021   CL 107 12/02/2021   CO2 25 12/02/2021   Lab Results  Component Value Date   ALT 12 12/02/2021   AST 18 12/02/2021   ALKPHOS 53 12/02/2021   BILITOT 0.5 12/02/2021      RADIOGRAPHY: [place] RT BREAST BX W LOC DEV 1ST LESION IMG BX SPEC US GUIDE  Addendum Date: 11/17/2021   ADDENDUM REPORT: 11/17/2021 15:14 ADDENDUM: Pathology revealed GRADE I INVASIVE DUCTAL CARCINOMA, MORPHOLOGICALLY SUSPICIOUS FOR A POSSIBLE TUBULAR CARCINOMA, CALCIFICATIONS PRESENT of the RIGHT breast, 10 o'clock, (ribbon clip). This was found to be concordant by Dr. Nolon Nations. Pathology results were discussed with the patient by telephone. The patient reported doing well after the biopsy with tenderness at the site. Post biopsy instructions and care were reviewed and questions were answered. The patient was encouraged to call [facility] for any additional concerns. My direct phone number was provided. The patient was referred to [facility] on December 02, 2021, per patient request. Pathology results reported by Terie Purser, RN on 11/17/2021. Electronically Signed   By: Nolon Nations M.D.   On: 11/17/2021 15:14   Result Date: 11/17/2021 CLINICAL DATA:  Patient presents for ultrasound-guided biopsy of RIGHT breast mass. EXAM: ULTRASOUND GUIDED RIGHT BREAST CORE NEEDLE BIOPSY COMPARISON:  Prior studies. PROCEDURE: I met with the patient and we discussed the procedure of ultrasound-guided biopsy, including benefits and alternatives. We discussed the high likelihood of a successful procedure. We discussed the risks of the procedure, including infection, bleeding, tissue injury, clip migration, and inadequate sampling. Informed written consent was given. The usual time-out protocol was performed  immediately prior to the procedure. Lesion quadrant: UPPER-OUTER QUADRANT RIGHT breast Using sterile technique and 1% Lidocaine as local anesthetic, under direct ultrasound visualization, a 12 gauge spring-loaded device was used to perform biopsy of mass the 10 o'clock location of the RIGHT breast using a inferior to superior approach. At the conclusion of the procedure ribbon shaped tissue marker clip was deployed into the biopsy cavity. Follow up 2 view mammogram was performed and dictated separately. IMPRESSION: Ultrasound guided biopsy of RIGHT breast mass. No apparent complications. Electronically Signed: By: Nolon Nations M.D. On: 11/13/2021 15:26  MM CLIP PLACEMENT RIGHT  Result Date: 11/13/2021 CLINICAL DATA:  Status post ultrasound-guided core biopsy of RIGHT breast mass. EXAM: 3D DIAGNOSTIC RIGHT MAMMOGRAM POST ULTRASOUND BIOPSY COMPARISON:  Previous exam(s). FINDINGS: 3D Mammographic images were obtained following ultrasound guided biopsy of mass in the 10 o'clock location of the RIGHT breast and placement of a ribbon shaped. The biopsy marking clip is in expected position at the site of biopsy in correlates well with the mammographic abnormality. IMPRESSION: Appropriate positioning of the ribbon shaped biopsy marking clip at the site of biopsy in the UPPER-OUTER QUADRANT RIGHT breast. Final Assessment: Post Procedure Mammograms for Marker Placement Electronically Signed   By: Benjamine Mola  Owens Shark M.D.   On: 11/13/2021 15:36  MM DIAG BREAST TOMO UNI RIGHT  Result Date: 11/13/2021 CLINICAL DATA:  Recall from screening mammography, possible asymmetry in the upper RIGHT breast at far posterior depth visible only on the MLO view and separate group of calcifications involving the outer RIGHT breast at posterior depth. EXAM: DIGITAL DIAGNOSTIC UNILATERAL RIGHT MAMMOGRAM WITH TOMOSYNTHESIS AND CAD; ULTRASOUND RIGHT BREAST LIMITED TECHNIQUE: Right digital diagnostic mammography and breast tomosynthesis was  performed. The images were evaluated with computer-aided detection.; Targeted ultrasound examination of the right breast was performed. COMPARISON:  Previous exam(s). ACR Breast Density Category c: The breast tissue is heterogeneously dense, which may obscure small masses. FINDINGS: Spot magnification CC and mediolateral views of the calcifications, spot compression MLO view of the area of concern in the upper breast, and full field mediolateral and laterally exaggerated CC views were obtained. The spot compression view demonstrates persistent architectural distortion associated with an asymmetry or mass in the upper breast at far posterior depth. The asymmetry and associated distortion is more inferiorly located on the full field mediolateral view relative to its position on the MLO view, indicating a location in the outer breast, therefore [place]. The asymmetry and distortion may be present in outer breast the laterally exaggerated CC view, though I am not certain, and it is certainly more conspicuous on the mediolateral and MLO views. There is a 3 mm group of faint calcifications in the LOWER OUTER QUADRANT at posterior depth which demonstrate layering on the mediolateral view, indicating benign milk of calcium. There are no suspicious linear or branching forms. Targeted ultrasound is performed, demonstrating an irregular hypoechoic mass with vague margins at the 10 o'clock position 9 cm from the nipple measuring approximately 0.7 x 0.5 x 0.6 cm, demonstrating posterior acoustic shadowing and demonstrating internal power Doppler flow, corresponding to the screening mammographic finding. Sonographic evaluation of the RIGHT axilla demonstrates no pathologic lymphadenopathy. There are 2 adjacent normal appearing intramammary lymph nodes adjacent to the mass. On correlative physical examination, there is a superficial firm subcentimeter mass in the UPPER OUTER QUADRANT near the axillary tail.  IMPRESSION: 1. Suspicious 0.7 cm mass in the UPPER OUTER QUADRANT of the RIGHT breast at the 10 o'clock position 9 cm from the nipple which accounts for the screening mammographic asymmetry associated with distortion. 2. No pathologic RIGHT axillary lymphadenopathy. 3. Benign 3 mm group of calcifications in the LOWER OUTER QUADRANT (benign milk of calcium). RECOMMENDATION: Ultrasound-guided core needle biopsy of the RIGHT breast mass. I have discussed the findings and recommendations with the patient. The ultrasound core needle biopsy procedure was discussed with the patient and her questions were answered. She wishes to proceed with the biopsy which has been scheduled by the Breast Imaging staff. BI-RADS CATEGORY  5: Highly suggestive of malignancy. Electronically Signed   By: Evangeline Dakin M.D.   On: 11/13/2021 10:43  US BREAST LTD UNI RIGHT INC AXILLA  Result Date: 11/13/2021 CLINICAL DATA:  Recall from screening mammography, possible asymmetry in the upper RIGHT breast at far posterior depth visible only on the MLO view and separate group of calcifications involving the outer RIGHT breast at posterior depth. EXAM: DIGITAL DIAGNOSTIC UNILATERAL RIGHT MAMMOGRAM WITH TOMOSYNTHESIS AND CAD; ULTRASOUND RIGHT BREAST LIMITED TECHNIQUE: Right digital diagnostic mammography and breast tomosynthesis was performed. The images were evaluated with computer-aided detection.; Targeted ultrasound examination of the right breast was performed. COMPARISON:  Previous exam(s). ACR Breast Density Category c: The breast tissue is heterogeneously dense,  which may obscure small masses. FINDINGS: Spot magnification CC and mediolateral views of the calcifications, spot compression MLO view of the area of concern in the upper breast, and full field mediolateral and laterally exaggerated CC views were obtained. The spot compression view demonstrates persistent architectural distortion associated with an asymmetry or mass in the  upper breast at far posterior depth. The asymmetry and associated distortion is more inferiorly located on the full field mediolateral view relative to its position on the MLO view, indicating a location in the outer breast, therefore [place]. The asymmetry and distortion may be present in outer breast the laterally exaggerated CC view, though I am not certain, and it is certainly more conspicuous on the mediolateral and MLO views. There is a 3 mm group of faint calcifications in the LOWER OUTER QUADRANT at posterior depth which demonstrate layering on the mediolateral view, indicating benign milk of calcium. There are no suspicious linear or branching forms. Targeted ultrasound is performed, demonstrating an irregular hypoechoic mass with vague margins at the 10 o'clock position 9 cm from the nipple measuring approximately 0.7 x 0.5 x 0.6 cm, demonstrating posterior acoustic shadowing and demonstrating internal power Doppler flow, corresponding to the screening mammographic finding. Sonographic evaluation of the RIGHT axilla demonstrates no pathologic lymphadenopathy. There are 2 adjacent normal appearing intramammary lymph nodes adjacent to the mass. On correlative physical examination, there is a superficial firm subcentimeter mass in the UPPER OUTER QUADRANT near the axillary tail. IMPRESSION: 1. Suspicious 0.7 cm mass in the UPPER OUTER QUADRANT of the RIGHT breast at the 10 o'clock position 9 cm from the nipple which accounts for the screening mammographic asymmetry associated with distortion. 2. No pathologic RIGHT axillary lymphadenopathy. 3. Benign 3 mm group of calcifications in the LOWER OUTER QUADRANT (benign milk of calcium). RECOMMENDATION: Ultrasound-guided core needle biopsy of the RIGHT breast mass. I have discussed the findings and recommendations with the patient. The ultrasound core needle biopsy procedure was discussed with the patient and her questions were answered. She wishes to  proceed with the biopsy which has been scheduled by the Breast Imaging staff. BI-RADS CATEGORY  5: Highly suggestive of malignancy. Electronically Signed   By: Evangeline Dakin M.D.   On: 11/13/2021 10:43      IMPRESSION/ PLAN:  The patient has a new diagnosis of an early stage right-sided breast cancer, clinical T1b N0 M0.  The patient is felt to be a good candidate for breast conservation treatment.  She has discussed proceeding with a lumpectomy with surgery.  Depending on final pathology, an Oncotype test may be ordered.  The patient is a good candidate for subsequent adjuvant radiation treatment.  We discussed various schemes including both a standard fractionation course of 6-1/2 weeks versus a hypofractionated course of 4 weeks.  She is going to think about this some more.  We did discuss the evidence associated with each of these.  All of the patient's questions were answered.  She does wish to proceed with adjuvant radiation treatment at the appropriate time and we look forward to seeing her postoperatively.   The patient was seen in person today in clinic.  The total time spent on the patient's visit today was 45 minutes, including chart review, direct discussion/evaluation with the patient, and coordination of care.        ________________________________   Jodelle Gross, MD, PhD   **Disclaimer: This note was dictated with voice recognition software. Similar sounding words can inadvertently be transcribed and  this note may contain transcription errors which may not have been corrected upon publication of note.**

## 2021-12-02 NOTE — Assessment & Plan Note (Signed)
11/13/2021:Mammogram detected right breast asymmetry with calcifications, mass detected at ultrasound at 10 o'clock position measuring 0.7 cm, axilla negative, biopsy: Grade 1 IDC with tubular features ER 100%, PR 95%, Ki-67 10%, HER2 negative  Pathology and radiology counseling:Discussed with the patient, the details of pathology including the type of breast cancer,the clinical staging, the significance of ER, PR and HER-2/neu receptors and the implications for treatment. After reviewing the pathology in detail, we proceeded to discuss the different treatment options between surgery, radiation, chemotherapy, antiestrogen therapies.  Recommendations: 1. Breast conserving surgery followed by 2. +/- Oncotype DX testing (based upon final size of the tumor) 3. Adjuvant radiation therapy followed by 4. Adjuvant antiestrogen therapy  Oncotype counseling: I discussed Oncotype DX test. I explained to the patient that this is a 21 gene panel to evaluate patient tumors DNA to calculate recurrence score. This would help determine whether patient has high risk or low risk breast cancer. She understands that if her tumor was found to be high risk, she would benefit from systemic chemotherapy. If low risk, no need of chemotherapy.  Return to clinic after surgery to discuss final pathology report and then determine if Oncotype DX testing will need to be sent.

## 2021-12-02 NOTE — Research (Signed)
[employer] 2021-05 - Specimen Collection Study to Evaluate Biomarkers in Subjects with Cancer   SECOND ELIGIBILITY  This Nurse has reviewed this patient's inclusion and exclusion criteria as a second review and confirms [name] is eligible for study participation.  Patient may continue with enrollment.  Wells Guiles 'Learta CoddingNeysa Bonito, RN, BSN Clinical Research Nurse I 12/02/21 11:09 AM

## 2021-12-02 NOTE — Therapy (Addendum)
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 [name] consulting w/Dr. Gertie Fey for this   Leukopenia 09/29/2011   Parotid mass 09/28/2011   S/P excision benign mass R parotid 12/09   Past Surgical History:  Procedure Laterality Date   CESAREAN SECTION  2008   LAPAROSCOPIC ASSISTED VAGINAL HYSTERECTOMY N/A 10/25/2012   Procedure: LAPAROSCOPIC ASSISTED VAGINAL HYSTERECTOMY;  Surgeon: Allena Katz, MD;  Location: [facility];  Service: Gynecology;  Laterality: N/A;  with aspiration of left ovarian cyst   TUBAL LIGATION     Feb 2012   Patient Active Problem List   Diagnosis Date Noted   Malignant neoplasm of upper-outer quadrant of right breast in female, estrogen receptor positive ([place]) 11/26/2021   Leukopenia 09/29/2011   Chronic idiopathic monocytosis 09/29/2011   Chronic ITP (idiopathic thrombocytopenia) ([facility]) 09/28/2011   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 Coralie Keens  REFERRING DIAG: Right breast cancer  THERAPY DIAG:  Malignant neoplasm of upper-outer quadrant of right breast in female, estrogen receptor positive ([place]) - Plan: PT plan of care cert/re-cert  Abnormal  posture - Plan: PT plan of care cert/re-cert  Rationale for Evaluation and Treatment Rehabilitation  ONSET DATE: 11/02/2021  SUBJECTIVE                                                                                                                                                                                           SUBJECTIVE STATEMENT: Patient reports she is here today to be seen by her medical team for her newly diagnosed right breast cancer.   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 It measures 7  mm and is located in the upper outer quadrant. It is ER/PR positive and HER2 negative with a Ki67 of 10%.   PATIENT GOALS   reduce lymphedema risk and learn post op HEP.   PAIN:  Are you having pain? No   PRECAUTIONS: Active CA   HAND DOMINANCE: right  WEIGHT BEARING RESTRICTIONS No  FALLS:  Has patient fallen in last 6 months? No  LIVING ENVIRONMENT: Patient lives with: her husband and 3 kids ages 81, and 52 year old twins Lives in: House/apartment Has following equipment at home: None  OCCUPATION: Attorney  LEISURE: She rides her [place] bike daily for 30-45 min.  PRIOR LEVEL OF FUNCTION: Independent   OBJECTIVE  COGNITION:  Overall cognitive status: Within functional limits for tasks assessed    POSTURE:  Forward head and rounded shoulders posture  UPPER EXTREMITY AROM/PROM:  A/PROM RIGHT   eval   Shoulder extension 55  Shoulder flexion 148  Shoulder abduction 155  Shoulder internal rotation 73  Shoulder external rotation 90    (Blank rows = not tested)  A/PROM LEFT   eval  Shoulder extension 62  Shoulder flexion 147  Shoulder abduction 148  Shoulder internal rotation 68  Shoulder external rotation 82    (Blank rows = not tested)   CERVICAL AROM: All within normal limits  UPPER EXTREMITY STRENGTH: WNL   LYMPHEDEMA ASSESSMENTS:   LANDMARK RIGHT   eval  10 cm  proximal to olecranon process 25.2  Olecranon process 24  10 cm proximal to ulnar styloid process 20.3  Just proximal to ulnar styloid process 15.2  Across hand at thumb web space 19.2  At base of 2nd digit 6  (Blank rows = not tested)  LANDMARK LEFT   eval  10 cm proximal to olecranon process 25.5  Olecranon process 23.3  10 cm proximal to ulnar styloid process 19.9  Just proximal to ulnar styloid process 14.7  Across hand at thumb web space 18.5  At base of 2nd digit 5.6  (Blank rows = not tested)   L-DEX LYMPHEDEMA SCREENING:  The patient was assessed using the L-Dex machine today to produce a lymphedema index baseline score. The patient will be reassessed on a regular basis (typically every 3 months) to obtain new L-Dex scores. If the score is > 6.5 points away from his/her baseline score indicating onset of subclinical lymphedema, it will be recommended to wear a compression garment for 4 weeks, 12 hours per day and then be reassessed. If the score continues to be > 6.5 points from baseline at reassessment, we will initiate lymphedema treatment. Assessing in this manner has a 95% rate of preventing clinically significant lymphedema.   L-DEX FLOWSHEETS - 12/02/21 1000       L-DEX LYMPHEDEMA SCREENING   Measurement Type Unilateral    L-DEX MEASUREMENT EXTREMITY Upper Extremity    POSITION  Standing    DOMINANT SIDE Right    At Risk Side Right    BASELINE SCORE (UNILATERAL) 0.4              QUICK DASH SURVEY:   PATIENT EDUCATION:  Education details: Lymphedema risk reduction and post op shoulder/posture HEP Person educated: Patient Education method: Explanation, Demonstration, Handout Education comprehension: Patient verbalized understanding and returned demonstration   HOME EXERCISE PROGRAM: Patient was instructed today in a home exercise program today for post op shoulder range of motion. These included active assist shoulder flexion in sitting, scapular  retraction, wall walking with shoulder abduction, and  hands behind head external rotation.  She was encouraged to do these twice a day, holding 3 seconds and repeating 5 times when permitted by her physician.   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 It measures 7 mm and is located in the upper outer quadrant. It is ER/PR positive and HER2 negative with a Ki67 of 10%. Her multidisciplinary medical team met prior to her assessments to determine a recommended treatment plan. She is planning to have a right lumpectomy and sentinel node biopsy followed by Oncotype testing, radiation, and anti-estrogen therapy. She will benefit from a post op PT reassessment to determine needs and from L-Dex screens every 3 months for 2 years to detect subclinical lymphedema.  Pt will benefit from skilled therapeutic intervention to improve on the following deficits: Decreased knowledge of precautions, impaired UE functional use, pain, decreased ROM, postural dysfunction.   PT treatment/interventions: ADL/self-care home management, pt/family education, therapeutic exercise  REHAB POTENTIAL: Excellent  CLINICAL DECISION MAKING: Stable/uncomplicated  EVALUATION COMPLEXITY: Low   GOALS: Goals reviewed with patient? YES  LONG TERM GOALS: (STG=LTG)    Name Target Date Goal status  1 Pt will be able to verbalize understanding of pertinent lymphedema risk reduction practices relevant to her dx specifically related to skin care.  Baseline:  No knowledge 12/02/2021 Achieved at eval  2 Pt will be able to return demo and/or verbalize understanding of the post op HEP related to regaining shoulder ROM. Baseline:  No knowledge 12/02/2021 Achieved at eval  3 Pt will be able to verbalize understanding of the importance of attending the post op After Breast CA Class for further lymphedema risk reduction education and therapeutic exercise.   Baseline:  No knowledge 12/02/2021 Achieved at eval  4 Pt will demo she has regained full shoulder ROM and function post operatively compared to baselines.  Baseline: See objective measurements taken today. 01/27/2022      PLAN: PT FREQUENCY/DURATION: EVAL and 1 follow up appointment.   PLAN FOR NEXT SESSION: will reassess 3-4 weeks post op to determine needs.   Patient will follow up at outpatient cancer rehab 3-4 weeks following surgery.  If the patient requires physical therapy at that time, a specific plan will be dictated and sent to the referring physician for approval. The patient was educated today on appropriate basic range of motion exercises to begin post operatively and the importance of attending the After Breast Cancer class following surgery.  Patient was educated today on lymphedema risk reduction practices as it pertains to recommendations that will benefit the patient immediately following surgery.  She verbalized good understanding.    Physical Therapy Information for After Breast Cancer Surgery/Treatment:  Lymphedema is a swelling condition that you may be at risk for in your arm if you have lymph nodes removed from the armpit area.  After a sentinel node biopsy, the risk is approximately 5-9% and is higher after an axillary node dissection.  There is treatment available for this condition and it is not life-threatening.  Contact your physician or physical therapist with concerns. You may begin the 4 shoulder/posture exercises (see additional sheet) when permitted by your physician (typically a week after surgery).  If you have drains, you may need to wait until those are removed before beginning range of motion exercises.  A general recommendation is to not lift your arms above shoulder height until drains are removed.  These exercises should be done to your tolerance  and gently.  This is not a "no pain/no gain" type of recovery so listen to your body and stretch into the range  of motion that you can tolerate, stopping if you have pain.  If you are having immediate reconstruction, ask your plastic surgeon about doing exercises as he or she may want you to wait. We encourage you to attend the free one time ABC (After Breast Cancer) class offered by [facility].  You will learn information related to lymphedema risk, prevention and treatment and additional exercises to regain mobility following surgery.  You can call [phone] for more information.  This is offered the 1st and 3rd Monday of each month.  You only attend the class one time. While undergoing any medical procedure or treatment, try to avoid blood pressure being taken or needle sticks from occurring on the arm on the side of cancer.   This recommendation begins after surgery and continues for the rest of your life.  This may help reduce your risk of getting lymphedema (swelling in your arm). An excellent resource for those seeking information on lymphedema is the National Lymphedema Network's web site. It can be accessed at [place].org If you notice swelling in your hand, arm or breast at any time following surgery (even if it is many years from now), please contact your doctor or physical therapist to discuss this.  Lymphedema can be treated at any time but it is easier for you if it is treated early on.  If you feel like your shoulder motion is not returning to normal in a reasonable amount of time, please contact your surgeon or physical therapist.  [facility] [phone]. [address], [address], [place] [address]  ABC CLASS After Breast Cancer Class  After Breast Cancer Class is a specially designed exercise class to assist you in a safe recover after having breast cancer surgery.  In this class you will learn how to get back to full function whether your drains were just removed or if you had surgery a month ago.  This one-time class is held  the 1st and 3rd Monday of every month from 11:00 a.m. until 12:00 noon virtually.  This class is FREE and space is limited. For more information or to register for the next available class, call [phone].  Class Goals  Understand specific stretches to improve the flexibility of you chest and shoulder. Learn ways to safely strengthen your upper body and improve your posture. Understand the warning signs of infection and why you may be at risk for an arm infection. Learn about Lymphedema and prevention.  ** You do not attend this class until after surgery.  Drains must be removed to participate  Patient was instructed today in a home exercise program today for post op shoulder range of motion. These included active assist shoulder flexion in sitting, scapular retraction, wall walking with shoulder abduction, and hands behind head external rotation.  She was encouraged to do these twice a day, holding 3 seconds and repeating 5 times when permitted by her physician.   Annia Friendly, [place] 12/02/21 10:53 AM

## 2021-12-02 NOTE — Research (Signed)
Trial: [employer] 2021-05 - Specimen Collection Study to Evaluate Biomarkers in Subjects with Cancer   Patient [name] was identified by Dr Lindi Adie as a potential candidate for the above listed study.  This Clinical Research Nurse met with [name], [id number], on 12/02/21 in a manner and location that ensures patient privacy to discuss participation in the above listed research study.  Patient is accompanied by her husband.  A copy of the informed consent document with embedded HIPAA language was provided to the patient.  Patient reads, speaks, and understands [place].    Patient was provided with the business card of this Nurse and encouraged to contact the research team with any questions.  Patient was provided the option of taking informed consent documents home to review and was encouraged to review at their convenience with their support network, including other care providers. Patient is comfortable with making a decision regarding study participation today.  As outlined in the informed consent form, this Nurse and Rowan Blase discussed the purpose of the research study, the investigational nature of the study, study procedures and requirements for study participation, potential risks and benefits of study participation, as well as alternatives to participation. This study is not blinded. The patient understands participation is voluntary and they may withdraw from study participation at any time.  This study does not involve randomization.  This study does not involve an investigational drug or device. This study does not involve a placebo. Patient understands enrollment is pending full eligibility review.   Confidentiality and how the patient's information will be used as part of study participation were discussed.  Patient was informed there is reimbursement provided for their time and effort spent on trial participation.  The patient is encouraged to discuss research  study participation with their insurance provider to determine what costs they may incur as part of study participation, including research related injury.    All questions were answered to patient's satisfaction.  The informed consent with embedded HIPAA language was reviewed page by page.  [name] mental and emotional status is appropriate to provide informed consent, and the patient verbalizes an understanding of study participation.  Patient has agreed to participate in the above listed research study and has voluntarily signed the informed consent 29 Jun 2021 with embedded HIPAA language, version 29 Jun 2021  on 12/02/21 at 1100AM.  The patient was provided with a copy of the signed informed consent form with embedded HIPAA language for their reference.  No study specific procedures were obtained prior to the signing of the informed consent document.  Approximately 15 minutes were spent with the patient reviewing the informed consent documents.  Patient was not requested to complete a Release of Information form.  Patient was provided gift card (receipt signed), and accompanied to the lab. She was thanked for her time, and she denies any questions.  [name], RN, BSN, [facility] She  Her  Hers Clinical Research Nurse [facility] Direct Dial [phone]  Pager [phone] 12/02/2021 11:47 AM`

## 2021-12-02 NOTE — Progress Notes (Signed)
REFERRING PROVIDER: Nicholas Lose, MD [place], [place] [address]  PRIMARY PROVIDER:  Gaynelle Arabian, MD  PRIMARY REASON FOR VISIT:  1. Malignant neoplasm of upper-outer quadrant of right breast in female, estrogen receptor positive ([place])   2. Family history of colon cancer in mother     HISTORY OF PRESENT ILLNESS:   [name], a 46 y.o. female, was seen for a [place] cancer genetics consultation during the breast multidisciplinary clinic at the request of Dr. Lindi Adie due to a personal and family history of cancer.  [name] presents to clinic today to discuss the possibility of a hereditary predisposition to cancer, to discuss genetic testing, and to further clarify her future cancer risks, as well as potential cancer risks for family members.   In June 2023, at the age of 46, [name] was diagnosed with invasive ductal carcinoma of the right breast.    CANCER HISTORY:  Oncology History  Malignant neoplasm of upper-outer quadrant of right breast in female, estrogen receptor positive ([place])  11/13/2021 Initial Diagnosis   Mammogram detected right breast asymmetry with calcifications, mass detected at ultrasound at 10 o'clock position measuring 0.7 cm, axilla negative, biopsy: Grade 1 IDC with tubular features ER 100%, PR 95%, Ki-67 10%, HER2 negative   12/02/2021 Cancer Staging   Staging form: Breast, AJCC 8th Edition - Clinical: Stage IA (cT1b, cN0, cM0, G1, ER+, PR+, HER2-) - Signed by Nicholas Lose, MD on 12/02/2021 Stage prefix: Initial diagnosis Histologic grading system: 3 grade system      RISK FACTORS:  Menarche was at age 55.  First live birth at age 51.  OCP use for approximately 12 years.  Ovaries intact: yes.  Uterus intact: no.  HRT use: 0 years. Colonoscopy: yes;  2022 . Mammogram within the last year: yes. 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 [name] consulting w/Dr. Gertie Fey for this   Leukopenia 09/29/2011   Parotid mass 09/28/2011   S/P excision benign mass R parotid 12/09    Past Surgical History:  Procedure Laterality Date   CESAREAN SECTION  2008   LAPAROSCOPIC ASSISTED VAGINAL HYSTERECTOMY N/A 10/25/2012   Procedure: LAPAROSCOPIC ASSISTED VAGINAL HYSTERECTOMY;  Surgeon: Allena Katz, MD;  Location: [facility];  Service: Gynecology;  Laterality: N/A;  with aspiration of left ovarian cyst   TUBAL LIGATION     Feb 2012    Social History   Socioeconomic History   Marital status: Married    Spouse name: Not on file   Number of children: Not on file   Years of education: Not on file   Highest education level: Not on file  Occupational History   Not on file  Tobacco Use   Smoking status: Never   Smokeless tobacco: Never  Substance and Sexual Activity   Alcohol use: Yes    Comment: occasionally   Drug use: No   Sexual activity: Not on file    Comment: s/p tubal ligation  Other Topics Concern   Not on file  Social History Narrative   Not on file   Social Determinants of Health   Financial Resource Strain: Low Risk  (12/02/2021)  Overall Financial Resource Strain (CARDIA)    Difficulty of Paying Living Expenses: Not hard at all  Food Insecurity: No Food Insecurity (12/02/2021)   Hunger Vital Sign    Worried About Running Out of Food in the Last Year: Never true    Ran Out of Food in the Last Year: Never true  Transportation Needs: No Transportation Needs (12/02/2021)   PRAPARE - Hydrologist (Medical): No    Lack of Transportation (Non-Medical): No  Physical Activity: Not on file  Stress: Not on file  Social Connections: Not on file     FAMILY HISTORY:  We  obtained a detailed, 4-generation family history.  Significant diagnoses are listed below: Family History  Problem Relation Age of Onset   Colon cancer Mother 72     [name]'s mother was diagnosed with colorectal cancer at age 55. [name] is unaware of previous family history of genetic testing for hereditary cancer risks. There is no reported Ashkenazi Jewish ancestry.   GENETIC COUNSELING ASSESSMENT: [name] is a 46 y.o. female with a personal and family history of cancer which is somewhat suggestive of a hereditary cancer syndrome and predisposition to cancer given her young age at diagnosis. We, therefore, discussed and recommended the following at today's visit.   DISCUSSION: We discussed that 5 - 10% of cancer is hereditary, with most cases of hereditary breast cancer associated with mutations in BRCA1/2.  There are other genes that can be associated with hereditary breast cancer syndromes. Type of cancer risk and level of risk are gene-specific. We discussed that testing is beneficial for several reasons including knowing how to follow individuals after completing their treatment, identifying whether potential treatment options would be beneficial, and understanding if other family members could be at risk for cancer and allowing them to undergo genetic testing.   We reviewed the characteristics, features and inheritance patterns of hereditary cancer syndromes. We also discussed genetic testing, including the appropriate family members to test, the process of testing, insurance coverage and turn-around-time for results. We discussed the implications of a negative, positive and/or variant of uncertain significant result. In order to get genetic test results in a timely manner so that [name] can use these genetic test results for surgical decisions, we recommended [name] pursue genetic testing for the BRCAplus. Once complete, we recommend [name] pursue reflex genetic  testing to a more comprehensive gene panel.   [name]  was offered a common hereditary cancer panel (47 genes) and an expanded pan-cancer panel (77 genes). [name] was informed of the benefits and limitations of each panel, including that expanded pan-cancer panels contain genes that do not have clear management guidelines at this point in time.  We also discussed that as the number of genes included on a panel increases, the chances of variants of uncertain significance increases.  After considering the benefits and limitations of each gene panel, Ms. Werden elected to have [facility] CancerNext-Expanded Panel.  The CancerNext-Expanded gene panel offered by [facility] and includes sequencing, rearrangement, and RNA analysis for the following 77 genes: AIP, ALK, APC, ATM, AXIN2, BAP1, BARD1, BLM, BMPR1A, BRCA1, BRCA2, BRIP1, CDC73, CDH1, CDK4, CDKN1B, CDKN2A, CHEK2, CTNNA1, DICER1, FANCC, FH, FLCN, GALNT12, KIF1B, LZTR1, MAX, MEN1, MET, MLH1, MSH2, MSH3, MSH6, MUTYH, NBN, NF1, NF2, NTHL1, PALB2, PHOX2B, PMS2, POT1, PRKAR1A, PTCH1, PTEN, RAD51C, RAD51D, RB1, RECQL, RET, SDHA, SDHAF2, SDHB, SDHC, SDHD, SMAD4, SMARCA4, SMARCB1, SMARCE1, STK11, SUFU, TMEM127, TP53, TSC1, TSC2, VHL  and XRCC2 (sequencing and deletion/duplication); EGFR, EGLN1, HOXB13, KIT, MITF, PDGFRA, POLD1, and POLE (sequencing only); EPCAM and GREM1 (deletion/duplication only).   Based on Ms. Beil's personal and family history of cancer, she meets medical criteria for genetic testing. Despite that she meets criteria, she may still have an out of pocket cost. We discussed that if her out of pocket cost for testing is over $100, the laboratory should contact them to discuss self-pay prices, patient pay assistance programs, if applicable, and other billing options.   PLAN: After considering the risks, benefits, and limitations, Ms. Partridge provided informed consent to pursue genetic testing and the blood sample was sent to [facility] for analysis of the CancerNext-Expanded Panel. Results should be available within approximately 1-2 weeks' time, at which point they will be disclosed by telephone to Ms. Wigley, as will any additional recommendations warranted by these results. Ms. Mones will receive a summary of her genetic counseling visit and a copy of her results once available. This information will also be available in Epic.   Ms. Monarch's questions were answered to her satisfaction today. Our contact information was provided should additional questions or concerns arise. Thank you for the referral and allowing [place] to share in the care of your patient.   Lucille Passy, MS, [facility] Genetic Counselor [email or web address] (P) [phone]  The patient was seen for a total of 20 minutes in face-to-face genetic counseling.  The patient brought her husband.  Drs. Lindi Adie and/or Burr Medico were available to discuss this case as needed.  _______________________________________________________________________ For Office Staff:  Number of people involved in session: 2 Was an Intern/ student involved with case: no

## 2021-12-02 NOTE — Research (Signed)
[employer] 2021-05 - Specimen Collection Study to Evaluate Biomarkers in Subjects with Cancer   Medical History:  High Blood Pressure  No Coronary Artery Disease No Lupus    No Rheumatoid Arthritis  No Diabetes   No      Lynch Syndrome  No  Is the patient currently taking a magnesium supplement?   No  Does the patient have a personal history of cancer (greater than 5 years ago)?  Yes  Skin cancer: treated with cream this year and excision last year  Does the patient have a family history of cancer in 1st or 2nd degree relatives? Yes If yes, Relationship(s) and Cancer type(s)? Mom: colorectal, in remission, dx age 43 (73 years)  Does the patient have history of alcohol consumption? Yes   If yes, current or former? Current Number of years? 25 Drinks per week? 2  Does the patient have history of cigarette, cigar, pipe, or chewing tobacco use?  No    This Nurse has reviewed this patient's inclusion and exclusion criteria and confirmed [name] is eligible for study participation.  Patient will continue with enrollment.  Eligibility confirmed by treating investigator, who also agrees that patient should proceed with enrollment.  Heather Skiff Sundra Haddix, RN, BSN, [facility] [name]  Her  [facility] Clinical Research Nurse [facility] Direct Dial [phone]  Pager [phone] 12/02/2021 11:44 AM

## 2021-12-07 DIAGNOSIS — C50911 Malignant neoplasm of unspecified site of right female breast: Secondary | ICD-10-CM | POA: Diagnosis not present

## 2021-12-09 ENCOUNTER — Other Ambulatory Visit: Payer: Self-pay | Admitting: Surgery

## 2021-12-09 DIAGNOSIS — Z853 Personal history of malignant neoplasm of breast: Secondary | ICD-10-CM

## 2021-12-09 NOTE — Pre-Procedure Instructions (Signed)
Surgical Instructions    Your procedure is scheduled on Wednesday 12/23/21.   Report to [facility] Main Entrance "A" at 06:30 A.M., then check in with the Admitting office.  Call this number if you have problems the morning of surgery:  [phone]   If you have any questions prior to your surgery date call [phone]: Open Monday-Friday 8am-4pm    Remember:  Do not eat after midnight the night before your surgery  You may drink clear liquids until 05:30 A.M. the morning of your surgery.   Clear liquids allowed are: Water, Non-Citrus Juices (without pulp), Carbonated Beverages, Clear Tea, Black Coffee ONLY (NO MILK, CREAM OR POWDERED CREAMER of any kind), and Gatorade  Patient Instructions  The night before surgery:  No food after midnight. ONLY clear liquids after midnight  The day of surgery (if you do NOT have diabetes):  Drink ONE (1) Pre-Surgery Clear Ensure by 05:30 A.M. the morning of surgery. Drink in one sitting. Do not sip.  This drink was given to you during your hospital  pre-op appointment visit.  Nothing else to drink after completing the  Pre-Surgery Clear Ensure.         If you have questions, please contact your surgeon's office.     Take these medicines the morning of surgery with A SIP OF WATER:   NONE   As of today, STOP taking any Aspirin (unless otherwise instructed by your surgeon) Aleve, Naproxen, Ibuprofen, Motrin, Advil, Goody's, BC's, all herbal medications, fish oil, and all vitamins.           Do not wear jewelry or makeup Do not wear lotions, powders, perfumes/colognes, or deodorant. Do not shave 48 hours prior to surgery.  Men may shave face and neck. Do not bring valuables to the hospital. Do not wear nail polish, gel polish, artificial nails, or any other type of covering on natural nails (fingers and toes) If you have artificial nails or gel coating that need to be removed by a nail salon, please have this removed prior to surgery.  Artificial nails or gel coating may interfere with anesthesia's ability to adequately monitor your vital signs.  [facility] is not responsible for any belongings or valuables. .   Do NOT Smoke (Tobacco/Vaping)  24 hours prior to your procedure  If you use a CPAP at night, you may bring your mask for your overnight stay.   Contacts, glasses, hearing aids, dentures or partials may not be worn into surgery, please bring cases for these belongings   For patients admitted to the hospital, discharge time will be determined by your treatment team.   Patients discharged the day of surgery will not be allowed to drive home, and someone needs to stay with them for 24 hours.   SURGICAL WAITING ROOM VISITATION Patients having surgery or a procedure in a hospital may have two support people. Children under the age of 64 must have an adult with them who is not the patient. They may stay in the waiting area during the procedure and may switch out with other visitors. If the patient needs to stay at the hospital during part of their recovery, the visitor guidelines for inpatient rooms apply.  Please refer to the [facility] website for the visitor guidelines for Inpatients (after your surgery is over and you are in a regular room).       Special instructions:    Oral Hygiene is also important to reduce your risk of infection.  Remember -  BRUSH YOUR TEETH THE MORNING OF SURGERY WITH YOUR REGULAR TOOTHPASTE   [facility]- Preparing For Surgery  Before surgery, you can play an important role. Because skin is not sterile, your skin needs to be as free of germs as possible. You can reduce the number of germs on your skin by washing with CHG (chlorahexidine gluconate) Soap before surgery.  CHG is an antiseptic cleaner which kills germs and bonds with the skin to continue killing germs even after washing.     Please do not use if you have an allergy to CHG or antibacterial soaps. If your skin becomes  reddened/irritated stop using the CHG.  Do not shave (including legs and underarms) for at least 48 hours prior to first CHG shower. It is OK to shave your face.  Please follow these instructions carefully.     Shower the NIGHT BEFORE SURGERY and the MORNING OF SURGERY with CHG Soap.   If you chose to wash your hair, wash your hair first as usual with your normal shampoo. After you shampoo, rinse your hair and body thoroughly to remove the shampoo.  Then [employer] and genitals (private parts) with your normal soap and rinse thoroughly to remove soap.  After that Use CHG Soap as you would any other liquid soap. You can apply CHG directly to the skin and wash gently with a scrungie or a clean washcloth.   Apply the CHG Soap to your body ONLY FROM THE NECK DOWN.  Do not use on open wounds or open sores. Avoid contact with your eyes, ears, mouth and genitals (private parts). Wash Face and genitals (private parts)  with your normal soap.   Wash thoroughly, paying special attention to the area where your surgery will be performed.  Thoroughly rinse your body with warm water from the neck down.  DO NOT shower/wash with your normal soap after using and rinsing off the CHG Soap.  Pat yourself dry with a CLEAN TOWEL.  Wear CLEAN PAJAMAS to bed the night before surgery  Place CLEAN SHEETS on your bed the night before your surgery  DO NOT SLEEP WITH PETS.   Day of Surgery:  Take a shower with CHG soap. Wear Clean/Comfortable clothing the morning of surgery Do not apply any deodorants/lotions.   Remember to brush your teeth WITH YOUR REGULAR TOOTHPASTE.    If you received a COVID test during your pre-op visit, it is requested that you wear a mask when out in public, stay away from anyone that may not be feeling well, and notify your surgeon if you develop symptoms. If you have been in contact with anyone that has tested positive in the last 10 days, please notify your surgeon.    Please  read over the following fact sheets that you were given.

## 2021-12-10 ENCOUNTER — Encounter (HOSPITAL_COMMUNITY): Payer: Self-pay | Admitting: Surgery

## 2021-12-10 ENCOUNTER — Encounter: Payer: Self-pay | Admitting: *Deleted

## 2021-12-10 ENCOUNTER — Telehealth: Payer: Self-pay | Admitting: Hematology and Oncology

## 2021-12-10 ENCOUNTER — Other Ambulatory Visit: Payer: Self-pay

## 2021-12-10 ENCOUNTER — Telehealth: Payer: Self-pay | Admitting: *Deleted

## 2021-12-10 ENCOUNTER — Encounter (HOSPITAL_COMMUNITY)
Admission: RE | Admit: 2021-12-10 | Discharge: 2021-12-10 | Disposition: A | Discharge status: Home / Self Care | Payer: BC Managed Care – PPO | Source: Ambulatory Visit | Attending: Surgery | Admitting: Surgery

## 2021-12-10 DIAGNOSIS — Z01818 Encounter for other preprocedural examination: Secondary | ICD-10-CM | POA: Diagnosis not present

## 2021-12-10 NOTE — Telephone Encounter (Signed)
Spoke with patient to follow up from [facility] and assess navigation needs. Patient denies any questions or concerns at this time. Encouraged her to call should anything arise.Patient verbalized understanding.  

## 2021-12-10 NOTE — Progress Notes (Signed)
PCP - Dr. Gaynelle Arabian  Chest x-ray - NI EKG - NI Stress Test - Denies ECHO -Denies Cardiac Cath - Denies  Sleep Study - Denies  DM - Denies  ERAS Protcol - Yes  PRE-SURGERY Ensure Given   COVID TEST- NI   Anesthesia review: No  Patient denies shortness of breath, fever, cough and chest pain at PAT appointment   All instructions explained to the patient, with a verbal understanding of the material. Patient agrees to go over the instructions while at home for a better understanding.  The opportunity to ask questions was provided.

## 2021-12-10 NOTE — Telephone Encounter (Signed)
.  Called patient to schedule appointment per 7/13 inbasket, patient is aware of date and time.   

## 2021-12-11 ENCOUNTER — Telehealth: Payer: Self-pay | Admitting: Genetic Counselor

## 2021-12-11 ENCOUNTER — Encounter: Payer: Self-pay | Admitting: Genetic Counselor

## 2021-12-11 DIAGNOSIS — Z1379 Encounter for other screening for genetic and chromosomal anomalies: Secondary | ICD-10-CM | POA: Insufficient documentation

## 2021-12-11 NOTE — Telephone Encounter (Signed)
I contacted Ms. Lauderbaugh to discuss her genetic testing results. No pathogenic variants were identified in the first 8 genes analyzed. Of note, we are still waiting on the pan-cancer panel. Detailed clinic note to follow.  The test report has been scanned into EPIC and is located under the Molecular Pathology section of the Results Review tab.  A portion of the result report is included below for reference.   Lucille Passy, MS, [facility] Genetic Counselor [email or web address] (P) [phone]

## 2021-12-18 ENCOUNTER — Telehealth: Payer: Self-pay | Admitting: Genetic Counselor

## 2021-12-18 NOTE — Telephone Encounter (Signed)
I attempted to contact Ms. Kamm to discuss her genetic testing results (77 genes). I left a voicemail requesting she call me back at [phone].  Lucille Passy, MS, [facility] Genetic Counselor [email or web address] (P) [phone],

## 2021-12-21 ENCOUNTER — Ambulatory Visit: Payer: Self-pay | Admitting: Genetic Counselor

## 2021-12-21 ENCOUNTER — Encounter: Payer: Self-pay | Admitting: Genetic Counselor

## 2021-12-21 ENCOUNTER — Telehealth: Payer: Self-pay | Admitting: Genetic Counselor

## 2021-12-21 DIAGNOSIS — Z1589 Genetic susceptibility to other disease: Secondary | ICD-10-CM | POA: Insufficient documentation

## 2021-12-21 DIAGNOSIS — Z1379 Encounter for other screening for genetic and chromosomal anomalies: Secondary | ICD-10-CM

## 2021-12-21 NOTE — Telephone Encounter (Signed)
I contacted [name] to discuss her genetic testing results. 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 This result is consistent with a diagnosis of moderately-increased lifetime colorectal cancer risk, NOT classic familial adenomatous polyposis (FAP) or attenuated FAP (AFAP). Detailed clinic note to follow.  The test report has been scanned into EPIC and is located under the Molecular Pathology section of the Results Review tab.  A portion of the result report is included below for reference.   Lucille Passy, MS, [facility] Genetic Counselor [email or web address] (P) [phone]

## 2021-12-21 NOTE — Progress Notes (Signed)
HPI:   [name] was previously seen in the [facility] due to a personal and family history of cancer and concerns regarding a hereditary predisposition to cancer. Please refer to our prior cancer genetics clinic note for more information regarding our discussion, assessment and recommendations, at the time. [name]'s recent genetic test results were disclosed to her, as were recommendations warranted by these results. These results and recommendations are discussed in more detail below.  CANCER HISTORY:  Oncology History  Malignant neoplasm of upper-outer quadrant of right breast in female, estrogen receptor positive (HCC)  11/13/2021 Initial Diagnosis   Mammogram detected right breast asymmetry with calcifications, mass detected at ultrasound at 10 o'clock position measuring 0.7 cm, axilla negative, biopsy: Grade 1 IDC with tubular features ER 100%, PR 95%, Ki-67 10%, HER2 negative   12/02/2021 Cancer Staging   Staging form: Breast, AJCC 8th Edition - Clinical: Stage IA (cT1b, cN0, cM0, G1, ER+, PR+, HER2-) - Signed by Gudena, Vinay, MD on 12/02/2021 Stage prefix: Initial diagnosis Histologic grading system: 3 grade system    Genetic Testing   [facility] Cancer[facility]-Expanded Panel was Positive.  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 This result is consistent with a diagnosis of moderately-increased lifetime colorectal cancer risk, NOT classic familial adenomatous polyposis (FAP) or attenuated FAP (AFAP). Report date is 12/14/2021.  The CancerNext-Expanded gene panel offered by [facility] and includes sequencing, rearrangement, and RNA analysis for the following 77 genes: AIP, ALK, APC, ATM, AXIN2, BAP1, BARD1, BLM, BMPR1A, BRCA1, BRCA2, BRIP1, CDC73, CDH1, CDK4, CDKN1B, CDKN2A, CHEK2, CTNNA1, DICER1, FANCC, FH, FLCN, GALNT12, KIF1B, LZTR1, MAX, MEN1, MET, MLH1, MSH2, MSH3, MSH6, MUTYH, NBN, NF1, NF2, NTHL1, PALB2,  PHOX2B, PMS2, POT1, PRKAR1A, PTCH1, PTEN, RAD51C, RAD51D, RB1, RECQL, RET, SDHA, SDHAF2, SDHB, SDHC, SDHD, SMAD4, SMARCA4, SMARCB1, SMARCE1, STK11, SUFU, TMEM127, TP53, TSC1, TSC2, VHL and XRCC2 (sequencing and deletion/duplication); EGFR, EGLN1, HOXB13, KIT, MITF, PDGFRA, POLD1, and POLE (sequencing only); EPCAM and GREM1 (deletion/duplication only).      FAMILY HISTORY:  We obtained a detailed, 4-generation family history.  Significant diagnoses are listed below:      Family History  Problem Relation Age of Onset   Colon cancer Mother 65       [name]'s mother was diagnosed with colorectal cancer at age 65. [name] is unaware of previous family history of genetic testing for hereditary cancer risks. There is no reported Ashkenazi Jewish ancestry.   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 Of note, this result DOES NOT indicate a diagnosis of familial adenomatous polyposis syndrome (FAP).  The test report has been scanned into EPIC and is located under the Molecular Pathology section of the Results Review tab.  A portion of the result report is included below for reference. Genetic testing reported out on 12/14/2021.       Cancer Risks for the p.I1307K variant in the APC gene: Colon cancer, 5-10% risk  This information is based on current understanding of the gene and may change in the future.  Management Recommendations: Colonoscopy screening every 5 years beginning at age 40 If an individual has a first-degree relative with colorectal cancer, screening should begin 10 years prior to the relative's age at diagnosis or at age 40, whichever comes first.  Implications for Family Members: Hereditary predisposition to cancer due to pathogenic variants   in the APC gene has autosomal dominant inheritance. This means that an individual with a pathogenic variant  has a 50% chance of passing the condition on to his/her offspring. Identification of a pathogenic variant allows for the recognition of at-risk relatives who can pursue testing for the familial variant.  Family members are encouraged to consider genetic testing for this familial pathogenic variant. As there are generally no childhood cancer risks associated with pathogenic variants in the APC gene p. I1307K pathogenic variant, individuals in the family are not recommended to have testing until they reach at least 48 years of age. They may contact our office at [phone] for more information or to schedule an appointment.  Complimentary testing for the familial variant is available for 90 days from the report date.  Family members who live outside of the area are encouraged to find a genetic counselor in their area by visiting: [email or web address].  Resources: FORCE (Facing Our Risk of Cancer Empowered) is a resource for those with a hereditary predisposition to develop cancer.  FORCE provides information about risk reduction, advocacy, legislation, and clinical trials.  Additionally, FORCE provides a platform for collaboration and support; which includes: peer navigation, message boards, local support groups, a toll-free helpline, research registry and recruitment, advocate training, published medical research, webinars, brochures, mastectomy photos, and more.  For more information, visit www.facingourrisk.org   Our contact number was provided. Ms. Kasinger's questions were answered to her satisfaction, and she knows she is welcome to call us at anytime with additional questions or concerns.   Lucille Passy, MS, [facility] Genetic Counselor [email or web address] (P) [phone]

## 2021-12-22 ENCOUNTER — Ambulatory Visit
Admission: RE | Admit: 2021-12-22 | Discharge: 2021-12-22 | Disposition: A | Discharge status: Home / Self Care | Payer: BC Managed Care – PPO | Source: Ambulatory Visit | Attending: Surgery | Admitting: Surgery

## 2021-12-22 DIAGNOSIS — C50911 Malignant neoplasm of unspecified site of right female breast: Secondary | ICD-10-CM | POA: Diagnosis not present

## 2021-12-22 DIAGNOSIS — Z853 Personal history of malignant neoplasm of breast: Secondary | ICD-10-CM

## 2021-12-22 NOTE — H&P (Signed)
MRN: [id number] DOB: April 12, 1976  Subjective   Chief Complaint: Breast Cancer   History of Present Illness: [name] is a 46 y.o. female who is seen as an office consultation for evaluation of Breast Cancer .  This is a 46 year old female who is seen on recent screening mammography to have an asymmetry in the right breast. There were also benign calcifications. An ultrasound showed a 7 mm mass in the upper outer quadrant of the right breast. The axilla was normal. She underwent a biopsy showing a right breast invasive ductal carcinoma which was grade 1. It was 100% ER positive, 95% PR positive, HER2 negative, and had a Ki-67 of 10%. She has had no previous problems regarding her breast. She has a history of ITP which is in remission. There is no family history of breast cancer. She denies nipple discharge. She has had a previous breast lift. She is otherwise without complaints   Review of Systems: A complete review of systems was obtained from the patient. I have reviewed this information and discussed as appropriate with the patient. See HPI as well for other ROS.  ROS   Medical History: Past Medical History:  Diagnosis Date  History of cancer   Patient Active Problem List  Diagnosis  Malignant neoplasm of upper-outer quadrant of right breast in female, estrogen receptor positive (CMS-HCC)  Chronic ITP (idiopathic thrombocytopenia) (CMS-HCC)  Aseptic necrosis bone (CMS-HCC)  Parotid mass   Past Surgical History:  Procedure Laterality Date  CESAREAN SECTION N/A 2008  LAPAROSCOPIC ASSISTED VAGINAL HYSTERECTOMY N/A 10/25/2012  LAPAROSCOPIC TUBAL LIGATION  Breast lift Right knee scope Right fibula graft to hip  Allergies  Allergen Reactions  Amoxicillin Unknown  Mupirocin Itching  Neosporin [Neomycin-Bacitracin-Polymyxin] Itching and Other (See Comments)  Other Reaction: redness  Other Itching and Unknown   Current Outpatient Medications on File Prior to Visit   Medication Sig Dispense Refill  spironolactone (ALDACTONE) 25 MG tablet Take 75 mg by mouth once daily  minocycline (MINOCIN,DYNACIN) 100 MG capsule  oxyCODONE-acetaminophen (PERCOCET) 5-325 mg tablet   No current facility-administered medications on file prior to visit.   Family History  Problem Relation Age of Onset  Colon cancer Mother  Diabetes Father  High blood pressure (Hypertension) Father  Stroke Daughter    Social History   Tobacco Use  Smoking Status Never  Smokeless Tobacco Never    Social History   Socioeconomic History  Marital status: Married  Tobacco Use  Smoking status: Never  Smokeless tobacco: Never  Vaping Use  Vaping Use: Never used  Substance and Sexual Activity  Alcohol use: Yes  Alcohol/week: 2.0 standard drinks  Types: 2 Cans of beer per week  Drug use: Never  Sexual activity: Defer   Objective:   Physical Exam   She appears well on exam  Breasts are normal in appearance bilaterally with well-healed surgical scars.  There are no palpable breast masses and no axillary adenopathy  Labs, Imaging and Diagnostic Testing: I have reviewed her mammograms, ultrasound, and pathology results  Assessment and Plan:   Diagnoses and all orders for this visit:  Invasive ductal carcinoma of breast, female, right (CMS-HCC)    We have discussed the patient's diagnosis in our multidisciplinary breast cancer conference. I had a discussion with the patient and her husband regarding the diagnosis of breast cancer. From a surgical standpoint we then discussed breast conservation versus mastectomy. She is interested in breast conservation with a lumpectomy. I next discussed proceeding with a radioactive  seed guided right breast lumpectomy. I explained the surgical procedure in detail. We also discussed sentinel lymph node biopsy and the reasons for doing this as well. I discussed the surgical technique as well. We discussed the risk of the procedure which  includes but is not limited to bleeding, infection, injury to surrounding structures, the need for further surgery if margins or lymph nodes are positive, cardiopulmonary issues, postoperative swelling, postop recovery, etc. Genetics will be ordered as well. From a surgical standpoint, she agrees with the plans. Surgery will be scheduled  Complex medical decision making    Coralie Keens, MD

## 2021-12-22 NOTE — Anesthesia Preprocedure Evaluation (Signed)
Anesthesia Evaluation  Patient identified by MRN, date of birth, ID band Patient awake    Reviewed: Allergy & Precautions, NPO status , Patient's Chart, lab work & pertinent test results  History of Anesthesia Complications Negative for: history of anesthetic complications  Airway Mallampati: I  TM Distance: >3 FB Neck ROM: Full    Dental  (+) Dental Advisory Given   Pulmonary neg pulmonary ROS,    breath sounds clear to auscultation       Cardiovascular negative cardio ROS   Rhythm:Regular Rate:Normal     Neuro/Psych negative neurological ROS     GI/Hepatic negative GI ROS, Neg liver ROS,   Endo/Other  negative endocrine ROS  Renal/GU negative Renal ROS     Musculoskeletal  (+) Arthritis ,   Abdominal   Peds  Hematology  (+) Blood dyscrasia (ITP), ,   Anesthesia Other Findings Breast cancer  Reproductive/Obstetrics                            Anesthesia Physical Anesthesia Plan  ASA: 3  Anesthesia Plan: General   Post-op Pain Management: Regional block* and Tylenol PO (pre-op)*   Induction: Intravenous  PONV Risk Score and Plan: 3 and Ondansetron, Dexamethasone and Scopolamine patch - Pre-op  Airway Management Planned: LMA  Additional Equipment: None  Intra-op Plan:   Post-operative Plan:   Informed Consent: I have reviewed the patients History and Physical, chart, labs and discussed the procedure including the risks, benefits and alternatives for the proposed anesthesia with the patient or authorized representative who has indicated his/her understanding and acceptance.     Dental advisory given  Plan Discussed with: CRNA and Surgeon  Anesthesia Plan Comments: (Plan routine monitors, GA with pectoralis block for post op analgesia)       Anesthesia Quick Evaluation

## 2021-12-23 ENCOUNTER — Other Ambulatory Visit: Payer: Self-pay

## 2021-12-23 ENCOUNTER — Ambulatory Visit (HOSPITAL_COMMUNITY): Payer: BC Managed Care – PPO | Admitting: Anesthesiology

## 2021-12-23 ENCOUNTER — Ambulatory Visit
Admission: RE | Admit: 2021-12-23 | Discharge: 2021-12-23 | Disposition: A | Discharge status: Home / Self Care | Payer: BC Managed Care – PPO | Source: Ambulatory Visit | Attending: Surgery | Admitting: Surgery

## 2021-12-23 ENCOUNTER — Encounter (HOSPITAL_COMMUNITY): Payer: Self-pay | Admitting: Surgery

## 2021-12-23 ENCOUNTER — Encounter (HOSPITAL_COMMUNITY)
Admission: RE | Disposition: A | Discharge status: Home / Self Care | Payer: Self-pay | Source: Ambulatory Visit | Attending: Surgery

## 2021-12-23 ENCOUNTER — Ambulatory Visit (HOSPITAL_COMMUNITY)
Admission: RE | Admit: 2021-12-23 | Discharge: 2021-12-23 | Disposition: A | Discharge status: Home / Self Care | Payer: BC Managed Care – PPO | Source: Ambulatory Visit | Attending: Surgery | Admitting: Surgery

## 2021-12-23 DIAGNOSIS — C50411 Malignant neoplasm of upper-outer quadrant of right female breast: Secondary | ICD-10-CM | POA: Diagnosis not present

## 2021-12-23 DIAGNOSIS — Z853 Personal history of malignant neoplasm of breast: Secondary | ICD-10-CM

## 2021-12-23 DIAGNOSIS — C50911 Malignant neoplasm of unspecified site of right female breast: Secondary | ICD-10-CM | POA: Diagnosis not present

## 2021-12-23 DIAGNOSIS — G8918 Other acute postprocedural pain: Secondary | ICD-10-CM | POA: Diagnosis not present

## 2021-12-23 DIAGNOSIS — Z170 Estrogen receptor positive status [ER+]: Secondary | ICD-10-CM | POA: Diagnosis not present

## 2021-12-23 DIAGNOSIS — M1990 Unspecified osteoarthritis, unspecified site: Secondary | ICD-10-CM | POA: Diagnosis not present

## 2021-12-23 DIAGNOSIS — R928 Other abnormal and inconclusive findings on diagnostic imaging of breast: Secondary | ICD-10-CM | POA: Diagnosis not present

## 2021-12-23 HISTORY — PX: BREAST LUMPECTOMY WITH RADIOACTIVE SEED AND SENTINEL LYMPH NODE BIOPSY: SHX6550

## 2021-12-23 HISTORY — PX: BREAST LUMPECTOMY: SHX2

## 2021-12-23 SURGERY — BREAST LUMPECTOMY WITH RADIOACTIVE SEED AND SENTINEL LYMPH NODE BIOPSY
Anesthesia: General | Site: Breast | Laterality: Right

## 2021-12-23 MED ORDER — BUPIVACAINE-EPINEPHRINE (PF) 0.25% -1:200000 IJ SOLN
INTRAMUSCULAR | Status: AC
  Filled 2021-12-23: qty 30

## 2021-12-23 MED ORDER — MIDAZOLAM HCL 2 MG/2ML IJ SOLN
INTRAMUSCULAR | Status: AC
  Filled 2021-12-23: qty 2

## 2021-12-23 MED ORDER — TRAMADOL HCL 50 MG PO TABS: 50.0000 mg | ORAL_TABLET | Freq: Four times a day (QID) | ORAL | 0 refills | Status: DC | PRN

## 2021-12-23 MED ORDER — PROPOFOL 10 MG/ML IV BOLUS
INTRAVENOUS | Status: DC | PRN
  Administered 2021-12-23: 200 mg via INTRAVENOUS

## 2021-12-23 MED ORDER — CIPROFLOXACIN IN D5W 400 MG/200ML IV SOLN
400.0000 mg | INTRAVENOUS | Status: AC
  Administered 2021-12-23: 400 mg via INTRAVENOUS

## 2021-12-23 MED ORDER — CHLORHEXIDINE GLUCONATE CLOTH 2 % EX PADS: 6.0000 | MEDICATED_PAD | Freq: Once | CUTANEOUS | Status: DC

## 2021-12-23 MED ORDER — CHLORHEXIDINE GLUCONATE 0.12 % MT SOLN: 15.0000 mL | Freq: Once | OROMUCOSAL | Status: AC

## 2021-12-23 MED ORDER — MIDAZOLAM HCL 5 MG/5ML IJ SOLN
INTRAMUSCULAR | Status: DC | PRN
  Administered 2021-12-23: 2 mg via INTRAVENOUS

## 2021-12-23 MED ORDER — ACETAMINOPHEN 500 MG PO TABS: 1000.0000 mg | ORAL_TABLET | ORAL | Status: AC

## 2021-12-23 MED ORDER — ONDANSETRON HCL 4 MG/2ML IJ SOLN
INTRAMUSCULAR | Status: DC | PRN
  Administered 2021-12-23: 4 mg via INTRAVENOUS

## 2021-12-23 MED ORDER — ONDANSETRON HCL 4 MG/2ML IJ SOLN
INTRAMUSCULAR | Status: AC
  Filled 2021-12-23: qty 2

## 2021-12-23 MED ORDER — 0.9 % SODIUM CHLORIDE (POUR BTL) OPTIME
TOPICAL | Status: DC | PRN
  Administered 2021-12-23: 1000 mL

## 2021-12-23 MED ORDER — FENTANYL CITRATE (PF) 250 MCG/5ML IJ SOLN
INTRAMUSCULAR | Status: DC | PRN
  Administered 2021-12-23 (×4): 25 ug via INTRAVENOUS

## 2021-12-23 MED ORDER — LACTATED RINGERS IV SOLN
INTRAVENOUS | Status: DC
Start: 2021-12-23 — End: 2021-12-23

## 2021-12-23 MED ORDER — FENTANYL CITRATE (PF) 250 MCG/5ML IJ SOLN
INTRAMUSCULAR | Status: AC
  Filled 2021-12-23: qty 5

## 2021-12-23 MED ORDER — ACETAMINOPHEN 500 MG PO TABS
ORAL_TABLET | ORAL | Status: AC
  Administered 2021-12-23: 1000 mg via ORAL
  Filled 2021-12-23: qty 2

## 2021-12-23 MED ORDER — DEXAMETHASONE SODIUM PHOSPHATE 4 MG/ML IJ SOLN
INTRAMUSCULAR | Status: DC | PRN
  Administered 2021-12-23: 5 mg via INTRAVENOUS

## 2021-12-23 MED ORDER — LIDOCAINE HCL (CARDIAC) PF 100 MG/5ML IV SOSY
PREFILLED_SYRINGE | INTRAVENOUS | Status: DC | PRN
  Administered 2021-12-23: 40 mg via INTRAVENOUS

## 2021-12-23 MED ORDER — CIPROFLOXACIN IN D5W 400 MG/200ML IV SOLN
INTRAVENOUS | Status: AC
  Filled 2021-12-23: qty 200

## 2021-12-23 MED ORDER — ORAL CARE MOUTH RINSE: 15.0000 mL | Freq: Once | OROMUCOSAL | Status: AC

## 2021-12-23 MED ORDER — SCOPOLAMINE 1 MG/3DAYS TD PT72: 1.0000 | MEDICATED_PATCH | TRANSDERMAL | Status: DC

## 2021-12-23 MED ORDER — DEXAMETHASONE SODIUM PHOSPHATE 10 MG/ML IJ SOLN
INTRAMUSCULAR | Status: AC
  Filled 2021-12-23: qty 1

## 2021-12-23 MED ORDER — MAGTRACE LYMPHATIC TRACER
INTRAMUSCULAR | Status: DC | PRN
  Administered 2021-12-23: 2 mL via INTRAMUSCULAR

## 2021-12-23 MED ORDER — CHLORHEXIDINE GLUCONATE 0.12 % MT SOLN
OROMUCOSAL | Status: AC
  Administered 2021-12-23: 15 mL via OROMUCOSAL
  Filled 2021-12-23: qty 15

## 2021-12-23 MED ORDER — BUPIVACAINE-EPINEPHRINE 0.25% -1:200000 IJ SOLN
INTRAMUSCULAR | Status: DC | PRN
  Administered 2021-12-23: 14 mL

## 2021-12-23 MED ORDER — ENSURE PRE-SURGERY PO LIQD: 296.0000 mL | Freq: Once | ORAL | Status: DC

## 2021-12-23 MED ORDER — LIDOCAINE 2% (20 MG/ML) 5 ML SYRINGE
INTRAMUSCULAR | Status: AC
  Filled 2021-12-23: qty 5

## 2021-12-23 MED ORDER — FENTANYL CITRATE (PF) 100 MCG/2ML IJ SOLN
INTRAMUSCULAR | Status: AC
  Filled 2021-12-23: qty 2

## 2021-12-23 MED ORDER — PROPOFOL 10 MG/ML IV BOLUS
INTRAVENOUS | Status: AC
Start: 2021-12-23 — End: ?
  Filled 2021-12-23: qty 20

## 2021-12-23 MED ORDER — SCOPOLAMINE 1 MG/3DAYS TD PT72
MEDICATED_PATCH | TRANSDERMAL | Status: AC
  Administered 2021-12-23: 1.5 mg via TRANSDERMAL
  Filled 2021-12-23: qty 1

## 2021-12-23 SURGICAL SUPPLY — 43 items
ADH SKN CLS APL DERMABOND .7 (GAUZE/BANDAGES/DRESSINGS) ×1
APL PRP STRL LF DISP 70% ISPRP (MISCELLANEOUS) ×1
APPLIER CLIP 9.375 MED OPEN (MISCELLANEOUS) ×2
APR CLP MED 9.3 20 MLT OPN (MISCELLANEOUS) ×1
BAG COUNTER SPONGE SURGICOUNT (BAG) ×3 IMPLANT
BAG SPNG CNTER NS LX DISP (BAG) ×1
BINDER BREAST LRG (GAUZE/BANDAGES/DRESSINGS) IMPLANT
BINDER BREAST XLRG (GAUZE/BANDAGES/DRESSINGS) IMPLANT
CANISTER SUCT 3000ML PPV (MISCELLANEOUS) ×3 IMPLANT
CHLORAPREP W/TINT 26 (MISCELLANEOUS) ×3 IMPLANT
CLIP APPLIE 9.375 MED OPEN (MISCELLANEOUS) ×2 IMPLANT
COVER PROBE W GEL 5X96 (DRAPES) ×4 IMPLANT
COVER SURGICAL LIGHT HANDLE (MISCELLANEOUS) ×3 IMPLANT
DERMABOND ADVANCED (GAUZE/BANDAGES/DRESSINGS) ×1
DERMABOND ADVANCED .7 DNX12 (GAUZE/BANDAGES/DRESSINGS) ×2 IMPLANT
DEVICE DUBIN SPECIMEN MAMMOGRA (MISCELLANEOUS) ×3 IMPLANT
DRAPE CHEST BREAST 15X10 FENES (DRAPES) ×3 IMPLANT
ELECT CAUTERY BLADE 6.4 (BLADE) ×3 IMPLANT
ELECT REM PT RETURN 9FT ADLT (ELECTROSURGICAL) ×2
ELECTRODE REM PT RTRN 9FT ADLT (ELECTROSURGICAL) ×2 IMPLANT
GAUZE PAD ABD 8X10 STRL (GAUZE/BANDAGES/DRESSINGS) ×1 IMPLANT
GAUZE SPONGE 4X4 12PLY STRL (GAUZE/BANDAGES/DRESSINGS) ×1 IMPLANT
GLOVE SURG SIGNA 7.5 PF LTX (GLOVE) ×3 IMPLANT
GOWN STRL REUS W/ TWL LRG LVL3 (GOWN DISPOSABLE) ×2 IMPLANT
GOWN STRL REUS W/ TWL XL LVL3 (GOWN DISPOSABLE) ×2 IMPLANT
GOWN STRL REUS W/TWL LRG LVL3 (GOWN DISPOSABLE) ×2
GOWN STRL REUS W/TWL XL LVL3 (GOWN DISPOSABLE) ×2
KIT BASIN OR (CUSTOM PROCEDURE TRAY) ×3 IMPLANT
KIT MARKER MARGIN INK (KITS) ×3 IMPLANT
NDL 18GX1X1/2 (RX/OR ONLY) (NEEDLE) IMPLANT
NDL FILTER BLUNT 18X1 1/2 (NEEDLE) IMPLANT
NDL HYPO 25GX1X1/2 BEV (NEEDLE) ×2 IMPLANT
NEEDLE 18GX1X1/2 (RX/OR ONLY) (NEEDLE) ×2 IMPLANT
NEEDLE FILTER BLUNT 18X 1/2SAF (NEEDLE)
NEEDLE FILTER BLUNT 18X1 1/2 (NEEDLE) IMPLANT
NEEDLE HYPO 25GX1X1/2 BEV (NEEDLE) ×2 IMPLANT
NS IRRIG 1000ML POUR BTL (IV SOLUTION) ×3 IMPLANT
PACK GENERAL/GYN (CUSTOM PROCEDURE TRAY) ×3 IMPLANT
SUT MNCRL AB 4-0 PS2 18 (SUTURE) ×3 IMPLANT
SUT VIC AB 3-0 SH 18 (SUTURE) ×3 IMPLANT
SYR CONTROL 10ML LL (SYRINGE) ×3 IMPLANT
TOWEL GREEN STERILE (TOWEL DISPOSABLE) ×3 IMPLANT
TOWEL GREEN STERILE FF (TOWEL DISPOSABLE) ×3 IMPLANT

## 2021-12-23 NOTE — Interval H&P Note (Signed)
History and Physical Interval Note:no change in H and P  12/23/2021 7:41 AM  [name]  has presented today for surgery, with the diagnosis of RIGHT BREAST CANCER.  The various methods of treatment have been discussed with the patient and family. After consideration of risks, benefits and other options for treatment, the patient has consented to  Procedure(s): RIGHT BREAST LUMPECTOMY WITH RADIOACTIVE SEED AND SENTINEL LYMPH NODE BIOPSY (Right) as a surgical intervention.  [name] history has been reviewed, patient examined, no change in status, stable for surgery.  I have reviewed the patient's chart and labs.  Questions were answered to the patient's satisfaction.     Coralie Keens

## 2021-12-23 NOTE — Anesthesia Postprocedure Evaluation (Signed)
Anesthesia Post Note  Patient: [name]  Procedure(s) Performed: RADIOACTIVE SEED GUIDED RIGHT BREAST LUMPECTOMY AND SENTINEL NODE BIOPSY (Right: Breast)     Patient location during evaluation: PACU Anesthesia Type: General Level of consciousness: awake and alert, patient cooperative and oriented Pain management: pain level controlled Vital Signs Assessment: post-procedure vital signs reviewed and stable Respiratory status: spontaneous breathing, nonlabored ventilation and respiratory function stable Cardiovascular status: blood pressure returned to baseline and stable Postop Assessment: no apparent nausea or vomiting Anesthetic complications: no   No notable events documented.  Last Vitals:  Vitals:   12/23/21 0953 12/23/21 1000  BP: 95/60 113/74  Pulse: 73 66  Resp: 17 16  Temp:  (!) 36.2 C  SpO2: 100% 100%    Last Pain:  Vitals:   12/23/21 1000  TempSrc:   PainSc: 0-No pain                 Marija Calamari,E. Nysa Sarin

## 2021-12-23 NOTE — Transfer of Care (Signed)
Immediate Anesthesia Transfer of Care Note  Patient: [name]  Procedure(s) Performed: RADIOACTIVE SEED GUIDED RIGHT BREAST LUMPECTOMY AND SENTINEL NODE BIOPSY (Right: Breast)  Patient Location: PACU  Anesthesia Type:GA combined with regional for post-op pain  Level of Consciousness: drowsy and responds to stimulation  Airway & Oxygen Therapy: Patient Spontanous Breathing and Patient connected to nasal cannula oxygen  Post-op Assessment: Report given to RN and Post -op Vital signs reviewed and stable  Post vital signs: Reviewed and stable  Last Vitals:  Vitals Value Taken Time  BP 97/59 12/23/21 0937  Temp    Pulse 59 12/23/21 0942  Resp 11 12/23/21 0942  SpO2 100 % 12/23/21 0942  Vitals shown include unvalidated device data.  Last Pain:  Vitals:   12/23/21 0653  TempSrc:   PainSc: 0-No pain         Complications: No notable events documented.

## 2021-12-23 NOTE — Anesthesia Procedure Notes (Signed)
Anesthesia Regional Block: Pectoralis block   Pre-Anesthetic Checklist: , timeout performed,  Correct Patient, Correct Site, Correct Laterality,  Correct Procedure, Correct Position, site marked,  Risks and benefits discussed,  Surgical consent,  Pre-op evaluation,  At surgeon's request and post-op pain management  Laterality: Right  Prep: chloraprep       Needles:  Injection technique: Single-shot  Needle Type: Echogenic Needle     Needle Length: 9cm  Needle Gauge: 21     Additional Needles:   Procedures:,,,, ultrasound used (permanent image in chart),,    Narrative:  Start time: 12/23/2021 7:42 AM End time: 12/23/2021 7:48 AM Injection made incrementally with aspirations every 5 mL.  Performed by: Personally  Anesthesiologist: Annye Asa, MD  Additional Notes: Pt identified in Holding room.  Monitors applied. Working IV access confirmed. Sterile prep R clavicle and pec.  #21ga ECHOgenic Arrow block needle between pec major and pec minor, ribs 4,5 with US guidance.  30cc 0.5% Bupivacaine 1:200k epi injected incrementally after negative test dose.  Patient asymptomatic, VSS, no heme aspirated, tolerated well.   Jenita Seashore, MD

## 2021-12-23 NOTE — Discharge Instructions (Signed)
[place] Office Phone Number [phone]  BREAST BIOPSY/ PARTIAL MASTECTOMY: POST OP INSTRUCTIONS  Always review your discharge instruction sheet given to you by the facility where your surgery was performed.  IF YOU HAVE DISABILITY OR FAMILY LEAVE FORMS, YOU MUST BRING THEM TO THE OFFICE FOR PROCESSING.  DO NOT GIVE THEM TO YOUR DOCTOR.  A prescription for pain medication may be given to you upon discharge.  Take your pain medication as prescribed, if needed.  If narcotic pain medicine is not needed, then you may take acetaminophen (Tylenol) or ibuprofen (Advil) as needed. Take your usually prescribed medications unless otherwise directed If you need a refill on your pain medication, please contact your pharmacy.  They will contact our office to request authorization.  Prescriptions will not be filled after 5pm or on week-ends. You should eat very light the first 24 hours after surgery, such as soup, crackers, pudding, etc.  Resume your normal diet the day after surgery. Most patients will experience some swelling and bruising in the breast.  Ice packs and a good support bra will help.  Swelling and bruising can take several days to resolve.  It is common to experience some constipation if taking pain medication after surgery.  Increasing fluid intake and taking a stool softener will usually help or prevent this problem from occurring.  A mild laxative (Milk of Magnesia or Miralax) should be taken according to package directions if there are no bowel movements after 48 hours. Unless discharge instructions indicate otherwise, you may remove your bandages 24-48 hours after surgery, and you may shower at that time.  You may have steri-strips (small skin tapes) in place directly over the incision.  These strips should be left on the skin for 7-10 days.  If your surgeon used skin glue on the incision, you may shower in 24 hours.  The glue will flake off over the next 2-3 weeks.  Any  sutures or staples will be removed at the office during your follow-up visit. ACTIVITIES:  You may resume regular daily activities (gradually increasing) beginning the next day.  Wearing a good support bra or sports bra minimizes pain and swelling.  You may have sexual intercourse when it is comfortable. You may drive when you no longer are taking prescription pain medication, you can comfortably wear a seatbelt, and you can safely maneuver your car and apply brakes. RETURN TO WORK:  ______________________________________________________________________________________ [name] should see your doctor in the office for a follow-up appointment approximately two weeks after your surgery.  Your doctor's nurse will typically make your follow-up appointment when she calls you with your pathology report.  Expect your pathology report 2-3 business days after your surgery.  You may call to check if you do not hear from [place] after three days. OTHER INSTRUCTIONS: YOU MAY REMOVE THE BINDER AND SHOWER STARTING TOMORROW.  PUT THE BINDER BACK ON AFTER THE SHOWER ICE PACK, TYLENOL, AND IBUPROFEN ALSO FOR PAIN NO VIGOROUS ACTIVITY FOR ONE WEEK _______________________________________________________________________________________________ _____________________________________________________________________________________________________________________________________ _____________________________________________________________________________________________________________________________________ _____________________________________________________________________________________________________________________________________  WHEN TO CALL YOUR DOCTOR: Fever over 101.0 Nausea and/or vomiting. Extreme swelling or bruising. Continued bleeding from incision. Increased pain, redness, or drainage from the incision.  The clinic staff is available to answer your questions during regular business hours.  Please don't  hesitate to call and ask to speak to one of the nurses for clinical concerns.  If you have a medical emergency, go to the nearest emergency room or call 911.  A surgeon from [facility]  is always on call at the hospital.  For further questions, please visit [email or web address]

## 2021-12-23 NOTE — Op Note (Signed)
   CASHA ESTUPINAN 12/23/2021   Pre-op Diagnosis: RIGHT BREAST CANCER     Post-op Diagnosis: same  Procedure(s): RADIOACTIVE SEED GUIDED RIGHT BREAST LUMPECTOMY  DEEP AXILLARY SENTINEL NODE BIOPSY INJECTION OF MAGTRACE FOR LYMPH NODE MAPPING  Surgeon(s): Coralie Keens, MD  Anesthesia: General  Staff:  Circulator: Hinda Glatter, RN; Beau Fanny, RN Scrub Person: Dante Gang Vendor Representative : Cathlean Cower RN First Assistant: Deland Pretty, RN  Estimated Blood Loss: Minimal               Specimens: sent to path  Indications: This is a 46 year old female was found to have a small mass on screening mammography of the right breast.  A biopsy of this mass showed invasive ductal carcinoma.  After discussion at our multidisciplinary breast cancer conference the decision was made to proceed with a right breast radioactive seed guided lumpectomy and sentinel lymph node biopsy  Procedure: The patient was brought to operating identifies correct patient.  She is placed upon the operating table and general anesthesia was induced.  Under sterile technique I then injected mag trace underneath the nipple areolar complex of the right breast and massaged the breast. Her right breast was then prepped and draped in usual sterile fashion.  Using the neoprobe I located the radioactive seed in the upper outer quadrant of the breast approximately 9 to 10 cm from the nipple areolar complex at the 10 o'clock position.  I elected to make an incision in the axillary line after anesthetized skin with Marcaine.  I then dissected down to the breast tissue with electrocautery.  I then dissected medially toward the malignancy in the breast.  With the aid of the neoprobe I then stayed widely around the radioactive seed going all the way down to the chest wall.  Once the lumpectomy specimen was removed I could see 1 lymph node in the specimen which was also visible on her previous imaging.  This  had uptake of mag trace.  It remained in the specimen.  All margins were marked with paint.  X-ray on the specimen confirmed that the radioactive seed and tissue marker from the previous biopsy were in the specimen.  The specimen was sent to pathology for evaluation. I then evaluated the axilla with the mag trace probe and identified more increased uptake in the axilla.  I then dissected down to the deep axillary tissue and removed 2 more sentinel lymph nodes which visibly had uptake of the mag trace as well as confirmed with the probe.  These were sent separately to pathology.  Hemostasis peer to be achieved with cautery.  I placed surgical clips around the biopsy cavity for marker purposes.  I then closed subtenons tissue with interrupted 3-0 Vicryl sutures and closed skin with a running 4-0 Monocryl.  Dermabond and a breast binder were then applied.  The patient tolerated the procedure well.  All the counts were correct at the end of the procedure.  The patient was then extubated in the operating room and taken in a stable condition to the recovery room.          Coralie Keens   Date: 12/23/2021  Time: 9:37 AM

## 2021-12-23 NOTE — Anesthesia Procedure Notes (Signed)
Procedure Name: LMA Insertion Date/Time: 12/23/2021 8:32 AM  Performed by: Michele Rockers, CRNAPre-anesthesia Checklist: Patient identified, Patient being monitored, Timeout performed, Emergency Drugs available and Suction available Patient Re-evaluated:Patient Re-evaluated prior to induction Oxygen Delivery Method: Circle system utilized Preoxygenation: Pre-oxygenation with 100% oxygen Induction Type: IV induction Ventilation: Mask ventilation without difficulty LMA: LMA inserted LMA Size: 4.0 Number of attempts: 1 Placement Confirmation: positive ETCO2 and breath sounds checked- equal and bilateral Tube secured with: Tape Dental Injury: Teeth and Oropharynx as per pre-operative assessment

## 2021-12-24 ENCOUNTER — Encounter: Payer: Self-pay | Admitting: Genetic Counselor

## 2021-12-24 ENCOUNTER — Encounter (HOSPITAL_COMMUNITY): Payer: Self-pay | Admitting: Surgery

## 2021-12-24 LAB — SURGICAL PATHOLOGY

## 2021-12-27 NOTE — Progress Notes (Signed)
Patient Care Team: Nicholas Lose, MD as PCP - General (Hematology and Oncology) Rockwell Germany, RN as Oncology Nurse Navigator Mauro Kaufmann, RN as Oncology Nurse Navigator Coralie Keens, MD as Consulting Physician (General Surgery) Nicholas Lose, MD as Consulting Physician (Hematology and Oncology) Kyung Rudd, MD as Consulting Physician (Radiation Oncology)  DIAGNOSIS:  Encounter Diagnosis  Name Primary?   Malignant neoplasm of upper-outer quadrant of right breast in female, estrogen receptor positive ([place])     SUMMARY OF ONCOLOGIC HISTORY: Oncology History  Malignant neoplasm of upper-outer quadrant of right breast in female, estrogen receptor positive ([place])  11/13/2021 Initial Diagnosis   Mammogram detected right breast asymmetry with calcifications, mass detected at ultrasound at 10 o'clock position measuring 0.7 cm, axilla negative, biopsy: Grade 1 IDC with tubular features ER 100%, PR 95%, Ki-67 10%, HER2 negative   12/02/2021 Cancer Staging   Staging form: Breast, AJCC 8th Edition - Clinical: Stage IA (cT1b, cN0, cM0, G1, ER+, PR+, HER2-) - Signed by Nicholas Lose, MD on 12/02/2021 Stage prefix: Initial diagnosis Histologic grading system: 3 grade system    Genetic Testing   [facility] CancerNext-Expanded Panel was Positive.  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 This result is consistent with a diagnosis of moderately-increased lifetime colorectal cancer risk, NOT classic familial adenomatous polyposis (FAP) or attenuated FAP (AFAP). Report date is 12/14/2021.  The CancerNext-Expanded gene panel offered by [facility] and includes sequencing, rearrangement, and RNA analysis for the following 77 genes: AIP, ALK, APC, ATM, AXIN2, BAP1, BARD1, BLM, BMPR1A, BRCA1, BRCA2, BRIP1, CDC73, CDH1, CDK4, CDKN1B, CDKN2A, CHEK2, CTNNA1, DICER1, FANCC, FH, FLCN, GALNT12, KIF1B, LZTR1, MAX, MEN1, MET, MLH1, MSH2, MSH3, MSH6,  MUTYH, NBN, NF1, NF2, NTHL1, PALB2, PHOX2B, PMS2, POT1, PRKAR1A, PTCH1, PTEN, RAD51C, RAD51D, RB1, RECQL, RET, SDHA, SDHAF2, SDHB, SDHC, SDHD, SMAD4, SMARCA4, SMARCB1, SMARCE1, STK11, SUFU, TMEM127, TP53, TSC1, TSC2, VHL and XRCC2 (sequencing and deletion/duplication); EGFR, EGLN1, HOXB13, KIT, MITF, PDGFRA, POLD1, and POLE (sequencing only); EPCAM and GREM1 (deletion/duplication only).    12/23/2021 Surgery   BREAST, RIGHT, LUMPECTOMY, Pathologic Stage Classification (pTNM, AJCC 8th Edition): pT1b, pN0   12/23/2021 Surgery   Rt Lumpectomy 0.8 cm, Grade 1, Margins Neg, 0/1 LN Neg, ER 100%, PR 95%, Ki-67 10%, HER2 negative     CHIEF COMPLIANT: Follow-up after surgery recent left lumpectomy  INTERVAL HISTORY: [name] is a 38 y.yo female with the above mention. She presents to the clinic today for a follow-up to discuss pathology report. She states surgery went fine. She had some concerns on if she could get immunizations.  She denies any major pain or discomfort in the breast.   ALLERGIES:  is allergic to amoxicillin and other.  MEDICATIONS:  Current Outpatient Medications  Medication Sig Dispense Refill   cholecalciferol (VITAMIN D3) 25 MCG (1000 UNIT) tablet Take 1,000 Units by mouth daily.     Multiple Vitamin (MULTIVITAMIN WITH MINERALS) TABS tablet Take 1 tablet by mouth daily.     spironolactone (ALDACTONE) 25 MG tablet Take 75 mg by mouth daily.     traMADol (ULTRAM) 50 MG tablet Take 1 tablet (50 mg total) by mouth every 6 (six) hours as needed for moderate pain or severe pain. 25 tablet 0   No current facility-administered medications for this visit.    PHYSICAL EXAMINATION: ECOG PERFORMANCE STATUS: 1 - Symptomatic but completely ambulatory  Vitals:   01/04/22 0851  BP: 104/71  Pulse: 63  Resp: 18  Temp: 97.7 F (36.5 C)  SpO2: 100%   Filed Weights   01/04/22 0851  Weight: 131 lb 1.6 oz (59.5 kg)   LABORATORY DATA:  I have reviewed the data as listed     Latest Ref Rng & Units 12/02/2021    8:02 AM 10/20/2012    9:45 AM 09/04/2012   10:18 AM  CMP  Glucose 70 - 99 mg/dL 84  87  85   BUN 6 - 20 mg/dL 10  13  13.7   Creatinine 0.44 - 1.00 mg/dL 0.88  0.77  0.8   Sodium 135 - 145 mmol/L 137  136  139   Potassium 3.5 - 5.1 mmol/L 4.1  4.3  4.5   Chloride 98 - 111 mmol/L 107  104  107   CO2 22 - 32 mmol/L _0 Calcium 8.9 - 10.3 mg/dL 9.4  9.9  10.2   Total Protein 6.5 - 8.1 g/dL 6.5   7.1   Total Bilirubin 0.3 - 1.2 mg/dL 0.5   0.42   Alkaline Phos 38 - 126 U/L 53   56   AST 15 - 41 U/L 18   17   ALT 0 - 44 U/L 12   7     Lab Results  Component Value Date   WBC 4.8 12/02/2021   HGB 14.0 12/02/2021   HCT 41.6 12/02/2021   MCV 92.0 12/02/2021   PLT 244 12/02/2021   NEUTROABS 3.0 12/02/2021    ASSESSMENT & PLAN:  Malignant neoplasm of upper-outer quadrant of right breast in female, estrogen receptor positive ([place]) 11/13/2021:Mammogram detected right breast asymmetry with calcifications, mass detected at ultrasound at 10 o'clock position measuring 0.7 cm, axilla negative, biopsy: Grade 1 IDC with tubular features ER 100%, PR 95%, Ki-67 10%, HER2 negative   12/23/21: Rt Lumpectomy 0.8 cm, Grade 1, Margins Neg, 0/1 LN Neg, ER 100%, PR 95%, Ki-67 10%, HER2 negative  Pathology counseling: I discussed the final pathology report of the patient provided  a copy of this report. I discussed the margins as well as lymph node surgeries. We also discussed the final staging along with previously performed ER/PR and HER-2/neu testing.  Plan: 1. Adjuvant radiation therapy followed by 2. Adjuvant antiestrogen therapy  RTC after XRT is complete      Orders Placed This Encounter  Procedures   FSH-Follicle stimulating hormone    Standing Status:   Future    Standing Expiration Date:   01/04/2023   Estradiol, Sensitive    Standing Status:   Future    Standing Expiration Date:   01/04/2023   The patient has a good understanding of the overall  plan. she agrees with it. she will call with any problems that may develop before the next visit here. Total time spent: 30 mins including face to face time and time spent for planning, charting and co-ordination of care   Harriette Ohara, MD 01/04/22    I Gardiner Coins am scribing for Dr. Lindi Adie  I have reviewed the above documentation for accuracy and completeness, and I agree with the above.

## 2021-12-29 ENCOUNTER — Encounter (HOSPITAL_COMMUNITY): Payer: Self-pay

## 2022-01-01 ENCOUNTER — Encounter: Payer: Self-pay | Admitting: *Deleted

## 2022-01-01 DIAGNOSIS — Z170 Estrogen receptor positive status [ER+]: Secondary | ICD-10-CM

## 2022-01-03 NOTE — Assessment & Plan Note (Signed)
11/13/2021:Mammogram detected right breast asymmetry with calcifications, mass detected at ultrasound at 10 o'clock position measuring 0.7 cm, axilla negative, biopsy: Grade 1 IDC with tubular features ER 100%, PR 95%, Ki-67 10%, HER2 negative   12/23/21: Rt Lumpectomy 0.8 cm, Grade 1, Margins Neg, 0/1 LN Neg, ER 100%, PR 95%, Ki-67 10%, HER2 negative  Pathology counseling: I discussed the final pathology report of the patient provided  a copy of this report. I discussed the margins as well as lymph node surgeries. We also discussed the final staging along with previously performed ER/PR and HER-2/neu testing.  Plan: 1. Adjuvant radiation therapy followed by 2. Adjuvant antiestrogen therapy  RTC after XRT is complete

## 2022-01-04 ENCOUNTER — Other Ambulatory Visit: Payer: Self-pay

## 2022-01-04 ENCOUNTER — Encounter: Payer: Self-pay | Admitting: *Deleted

## 2022-01-04 ENCOUNTER — Inpatient Hospital Stay: Payer: BC Managed Care – PPO

## 2022-01-04 ENCOUNTER — Inpatient Hospital Stay: Payer: BC Managed Care – PPO | Attending: Hematology and Oncology | Admitting: Hematology and Oncology

## 2022-01-04 DIAGNOSIS — C50411 Malignant neoplasm of upper-outer quadrant of right female breast: Secondary | ICD-10-CM | POA: Diagnosis not present

## 2022-01-04 DIAGNOSIS — Z79899 Other long term (current) drug therapy: Secondary | ICD-10-CM | POA: Diagnosis not present

## 2022-01-04 DIAGNOSIS — Z170 Estrogen receptor positive status [ER+]: Secondary | ICD-10-CM

## 2022-01-05 ENCOUNTER — Encounter: Payer: Self-pay | Admitting: Genetic Counselor

## 2022-01-06 LAB — FOLLICLE STIMULATING HORMONE: FSH: 3.6 m[IU]/mL

## 2022-01-07 LAB — ESTRADIOL, ULTRA SENS: Estradiol, Sensitive: 86.2 pg/mL

## 2022-01-13 NOTE — Therapy (Unsigned)
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 [name] consulting w/Dr. Gertie Fey for this   Leukopenia 09/29/2011   Parotid mass 09/28/2011   S/P excision benign mass R parotid 12/09   Past Surgical History:  Procedure Laterality Date   BREAST BIOPSY Right 11/13/2021   BREAST LUMPECTOMY WITH RADIOACTIVE SEED AND SENTINEL LYMPH NODE BIOPSY Right 12/23/2021   Procedure: RADIOACTIVE SEED GUIDED RIGHT BREAST LUMPECTOMY AND SENTINEL NODE BIOPSY;  Surgeon: Coralie Keens, MD;  Location: [place];  Service: General;  Laterality: Right;   CESAREAN SECTION  05/31/2006   CYST EXCISION  2020   R axillary   FEMUR SURGERY Right 2009   Fibula bone grafted to R femur   FINGER FRACTURE SURGERY Right 1992   Ring finger   LAPAROSCOPIC ASSISTED VAGINAL HYSTERECTOMY N/A 10/25/2012   Procedure: LAPAROSCOPIC ASSISTED VAGINAL HYSTERECTOMY;  Surgeon: Allena Katz, MD;  Location: [facility];  Service: Gynecology;  Laterality: N/A;  with aspiration of left ovarian cyst   MASS EXCISION Left 04/2008   R parotid   TUBAL LIGATION     Feb 2012   Patient Active Problem List   Diagnosis Date Noted   Gene mutation 12/21/2021   Genetic testing 12/11/2021   Family history  of colon cancer in mother 12/02/2021   Malignant neoplasm of upper-outer quadrant of right breast in female, estrogen receptor positive ([place]) 11/26/2021   Leukopenia 09/29/2011   Chronic idiopathic monocytosis 09/29/2011   Chronic ITP (idiopathic thrombocytopenia) ([place]) 09/28/2011   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 Coralie Keens  REFERRING DIAG: Right breast cancer  THERAPY DIAG:  Malignant neoplasm of upper-outer quadrant of right breast in female, estrogen receptor positive ([place])  Abnormal posture  Aftercare following surgery for neoplasm  Rationale for Evaluation and Treatment Rehabilitation  ONSET DATE: 12/23/2021  SUBJECTIVE:  SUBJECTIVE STATEMENT: Patient reports she underwent a right lumpectomy and sentinel node biopsy (1 negative node) on 12/23/2021. She will proceed with radiation and anti-estrogen therapy. She has radiation simulation 01/20/2022.  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 She underwent a right lumpectomy and sentinel node biopsy (1 negative node) on 12/23/2021. It is ER/PR positive and HER2 negative with a Ki67 of 10%.   PATIENT GOALS:  Reassess how my recovery is going related to arm function, pain, and swelling.  PAIN:  Are you having pain? No  PRECAUTIONS: Recent Surgery, right UE Lymphedema risk  ACTIVITY LEVEL / LEISURE: She is biking 4-5x/week for 30 min, light weights   OBJECTIVE:   PATIENT SURVEYS:  QUICK DASH:  Quick Dash - 01/14/22 0001     Open a tight or new jar No difficulty    Do heavy household chores (wash walls, wash floors) No difficulty    Carry a shopping bag or briefcase No difficulty    Wash your back No  difficulty    Use a knife to cut food No difficulty    Recreational activities in which you take some force or impact through your arm, shoulder, or hand (golf, hammering, tennis) No difficulty    During the past week, to what extent has your arm, shoulder or hand problem interfered with your normal social activities with family, friends, neighbors, or groups? Not at all    During the past week, to what extent has your arm, shoulder or hand problem limited your work or other regular daily activities Not at all    Arm, shoulder, or hand pain. None    Tingling (pins and needles) in your arm, shoulder, or hand None    Difficulty Sleeping No difficulty    DASH Score 0 %              OBSERVATIONS:  Right axillary incision is well healed. Cording present (see photo).    POSTURE:  Forward head, rounded shoulders  LYMPHEDEMA ASSESSMENT:   UPPER EXTREMITY AROM/PROM:   A/PROM RIGHT   eval   RIGHT 01/14/2022  Shoulder extension 55 65  Shoulder flexion 148 136  Shoulder abduction 155 143  Shoulder internal rotation 73 68  Shoulder external rotation 90 86                          (Blank rows = not tested)   A/PROM LEFT   eval  Shoulder extension 62  Shoulder flexion 147  Shoulder abduction 148  Shoulder internal rotation 68  Shoulder external rotation 82                          (Blank rows = not tested)     CERVICAL AROM: All within normal limits   UPPER EXTREMITY STRENGTH: WNL     LYMPHEDEMA ASSESSMENTS:    LANDMARK RIGHT   eval RIGHT 01/14/2022  10 cm proximal to olecranon process 25.2 25.2  Olecranon process 24 23.9  10 cm proximal to ulnar styloid process 20.3 19.5  Just proximal to ulnar styloid process 15.2 15  Across hand at thumb web space 19.2 19  At base of 2nd digit 6 5.7  (Blank rows = not tested)   LANDMARK LEFT   eval LEFT 01/14/2022  10 cm proximal to olecranon process 25.5 25.5  Olecranon process 23.3 23.6  10 cm proximal  to ulnar styloid  process 19.9 19.4  Just proximal to ulnar styloid process 14.7 14.6  Across hand at thumb web space 18.5 18.1  At base of 2nd digit 5.6 5.5  (Blank rows = not tested)        Surgery type/Date: Right lumpectomy and sentinel node biopsy 12/23/2021 Number of lymph nodes removed: 1 Current/past treatment (chemo, radiation, hormone therapy): none Other symptoms:  Heaviness/tightness No Pain No Pitting edema No Infections No Decreased scar mobility Yes Stemmer sign No   PATIENT EDUCATION:  Education details: HEP and lymphedema risk reduction Person educated: Patient Education method: Explanation, Media planner, and Handouts Education comprehension: verbalized understanding   HOME EXERCISE PROGRAM:  Reviewed previously given post op HEP. Added closed chain flexion and abduction to regain last 12 degrees of flexion and abduction.  ASSESSMENT:  CLINICAL IMPRESSION: Patient is doing well overall s/p right lumpectomy and sentinel node biopsy. She had 1 axillary node removed that was negative and proceeds to radiation next week. She is only lacking 12 degrees of flexion and abduction but axillary cording is clearly limiting her ROM (see photo). Incision appears to be healing well and there is no sign of lymphedema. She will benefit from PT to try to reduce and resolve cording prior to radiation and regain end ROM.  Pt will benefit from skilled therapeutic intervention to improve on the following deficits: Decreased knowledge of precautions, impaired UE functional use, pain, decreased ROM, postural dysfunction.   PT treatment/interventions: ADL/Self care home management, Therapeutic exercises, Therapeutic activity, Patient/Family education, Self Care, Manual lymph drainage, scar mobilization, Manual therapy, and Re-evaluation     GOALS: Goals reviewed with patient? Yes  LONG TERM GOALS:  (STG=LTG)  GOALS Name Target Date  Goal status  1 Pt will demonstrate she has regained full  shoulder ROM and function post operatively compared to baselines.   02/11/2022 IN PROGRESS  2 Patient will increase right shoulder flexion to >/= 145 degrees for increased ease reaching overhead. 02/11/2022 INITIAL  3 Patient will increase right shoulder abduction to >/= 155 degrees for increased ease obtaining radiation positioning. 02/11/2022 INITIAL  4 Patient will demonstrate visible absence of axillary cording as noted in photo. 02/11/2022 INITIAL     PLAN: PT FREQUENCY/DURATION: 3x/week for up to 4 weeks.  PLAN FOR NEXT SESSION: Myofascial release, PROM; progress HEP if needed but focus on manual therapy   [facility]  [address]  [place] [address]  [phone]  After Breast Cancer Class It is recommended you attend the ABC class to be educated on lymphedema risk reduction. This class is free of charge and lasts for 1 hour. It is a 1-time class. You will need to download the Webex app either on your phone or computer. We will send you a link the night before or the morning of the class. You should be able to click on that link to join the class. This is not a confidential class. You don't have to turn your camera on, but other participants may be able to see your email address. You are scheduled for August 21st, 2023.  Scar massage You can begin gentle scar massage to you incision sites. Gently place one hand on the incision and move the skin (without sliding on the skin) in various directions. Do this for a few minutes and then you can gently massage either coconut oil or vitamin E cream into the scars.  Compression garment You should continue wearing your compression bra  until you feel like you no longer have swelling.  Home exercise Program Continue doing the exercises you were given until you feel like you can do them without feeling any tightness at the end.   Walking Program Studies show that 30 minutes of walking per day (fast enough to  elevate your heart rate) can significantly reduce the risk of a cancer recurrence. If you can't walk due to other medical reasons, we encourage you to find another activity you could do (like a stationary bike or water exercise).  Posture After breast cancer surgery, people frequently sit with rounded shoulders posture because it puts their incisions on slack and feels better. If you sit like this and scar tissue forms in that position, you can become very tight and have pain sitting or standing with good posture. Try to be aware of your posture and sit and stand up tall to heal properly.  Follow up PT: It is recommended you return every 3 months for the first 3 years following surgery to be assessed on the SOZO machine for an L-Dex score. This helps prevent clinically significant lymphedema in 95% of patients. These follow up screens are 10 minute appointments that you are not billed for.  Annia Friendly, [place] 01/14/22 12:30 PM

## 2022-01-14 ENCOUNTER — Other Ambulatory Visit: Payer: Self-pay

## 2022-01-14 ENCOUNTER — Ambulatory Visit: Payer: BC Managed Care – PPO | Attending: Surgery | Admitting: Physical Therapy

## 2022-01-14 ENCOUNTER — Encounter: Payer: Self-pay | Admitting: Physical Therapy

## 2022-01-14 DIAGNOSIS — Z483 Aftercare following surgery for neoplasm: Secondary | ICD-10-CM | POA: Diagnosis not present

## 2022-01-14 DIAGNOSIS — C50411 Malignant neoplasm of upper-outer quadrant of right female breast: Secondary | ICD-10-CM | POA: Insufficient documentation

## 2022-01-14 DIAGNOSIS — R293 Abnormal posture: Secondary | ICD-10-CM | POA: Insufficient documentation

## 2022-01-14 DIAGNOSIS — Z170 Estrogen receptor positive status [ER+]: Secondary | ICD-10-CM | POA: Insufficient documentation

## 2022-01-14 NOTE — Progress Notes (Addendum)
Radiation Oncology         [phone]) [phone] ________________________________  Name: [name]        MRN: [id number]  Date of Service: 01/20/2022 DOB: [birth year]  UX:[id number], [name], MD  Nicholas Lose, MD     REFERRING PHYSICIAN: Nicholas Lose, MD   DIAGNOSIS: The encounter diagnosis was Malignant neoplasm of upper-outer quadrant of right breast in female, estrogen receptor positive ([place]).   HISTORY OF PRESENT ILLNESS: [name] is a 46 y.o. female originally seen in the multidisciplinary breast clinic for a new diagnosis of right breast cancer. The patient was found to have some calcifications and architectural change on screening mammogram.  Ultrasound revealed a 0.7 cm corresponding tumor.  No axillary adenopathy was seen on the right.  Biopsy of the suspicious right breast mass revealed a grade 1 invasive ductal carcinoma.  Receptor studies indicated that the tumor is estrogen receptor positive, progesterone receptor positive, and HER2/neu negative.  The Ki-67 staining was 10%.   Since her last visit, the patient has undergone right lumpectomy on 12/23/2021.  This revealed a grade 1 invasive ductal carcinoma measuring 8 mm in greatest dimensions.  Her margins were clear, and 1 sentinel lymph node was sampled was negative.  No systemic chemotherapy has been recommended but she is seen to discuss adjuvant radiotherapy to the right breast.       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 [name] consulting w/Dr. Gertie Fey for this   Leukopenia 09/29/2011   Parotid mass 09/28/2011   S/P excision benign mass R parotid 12/09       PAST SURGICAL HISTORY: Past Surgical  History:  Procedure Laterality Date   BREAST BIOPSY Right 11/13/2021   BREAST LUMPECTOMY WITH RADIOACTIVE SEED AND SENTINEL LYMPH NODE BIOPSY Right 12/23/2021   Procedure: RADIOACTIVE SEED GUIDED RIGHT BREAST LUMPECTOMY AND SENTINEL NODE BIOPSY;  Surgeon: Coralie Keens, MD;  Location: [facility];  Service: General;  Laterality: Right;   CESAREAN SECTION  05/31/2006   CYST EXCISION  2020   R axillary   FEMUR SURGERY Right 2009   Fibula bone grafted to R femur   FINGER FRACTURE SURGERY Right 1992   Ring finger   LAPAROSCOPIC ASSISTED VAGINAL HYSTERECTOMY N/A 10/25/2012   Procedure: LAPAROSCOPIC ASSISTED VAGINAL HYSTERECTOMY;  Surgeon: Allena Katz, MD;  Location: [facility];  Service: Gynecology;  Laterality: N/A;  with aspiration of left ovarian cyst   MASS EXCISION Left 04/2008   R parotid   TUBAL LIGATION     Feb 2012     FAMILY HISTORY:  Family History  Problem Relation Age of Onset   Colon cancer Mother 49     SOCIAL HISTORY:  reports that she has never smoked. She has never used smokeless tobacco. She reports current alcohol use. She reports that she does not use drugs. The patient is married and lives in [place].She's accompanied by her husband. 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 She has teenage children who are active in school and extracurricular activities as well.    ALLERGIES: Amoxicillin and Other   MEDICATIONS:  Current Outpatient Medications  Medication Sig Dispense Refill  cholecalciferol (VITAMIN D3) 25 MCG (1000 UNIT) tablet Take 1,000 Units by mouth daily.     Multiple Vitamin (MULTIVITAMIN WITH MINERALS) TABS tablet Take 1 tablet by mouth daily.     spironolactone (ALDACTONE) 25 MG tablet Take 75 mg by mouth daily.     traMADol (ULTRAM) 50 MG tablet Take 1 tablet (50 mg total) by mouth every 6 (six) hours as needed for moderate pain or severe pain. 25 tablet 0   No current facility-administered medications for this visit.     REVIEW OF  SYSTEMS: On review of systems, the patient reports that she is doing well overall since surgery. She's been working with PT for right sided cording following her surgery. No other complaints are verbalized.      PHYSICAL EXAM:  Wt Readings from Last 3 Encounters:  01/04/22 131 lb 1.6 oz (59.5 kg)  12/23/21 130 lb (59 kg)  12/10/21 133 lb (60.3 kg)   Temp Readings from Last 3 Encounters:  01/04/22 97.7 F (36.5 C) (Temporal)  12/23/21 (!) 97.1 F (36.2 C)  12/10/21 98 F (36.7 C)   BP Readings from Last 3 Encounters:  01/04/22 104/71  12/23/21 113/74  12/10/21 96/70   Pulse Readings from Last 3 Encounters:  01/04/22 63  12/23/21 66  12/10/21 74    In general this is a well appearing caucasian female in no acute distress. She's alert and oriented x4 and appropriate throughout the examination. Cardiopulmonary assessment is negative for acute distress and she exhibits normal effort.  Her right breast reveals a well-healed breast  incision site in upper lateral breast.  The incision is intact without erythema, or drainage.  Well healed incisions are also note from prior breast surgery. She does have mild cording over the anterior aspect of the axilla and into the biceps region of the RUE, however has excellent range of motion with abduction and lateral rotation.     ECOG = 1  0 - Asymptomatic (Fully active, able to carry on all predisease activities without restriction)  1 - Symptomatic but completely ambulatory (Restricted in physically strenuous activity but ambulatory and able to carry out work of a light or sedentary nature. For example, light housework, office work)  2 - Symptomatic, <50% in bed during the day (Ambulatory and capable of all self care but unable to carry out any work activities. Up and about more than 50% of waking hours)  3 - Symptomatic, >50% in bed, but not bedbound (Capable of only limited self-care, confined to bed or chair 50% or more of waking  hours)  4 - Bedbound (Completely disabled. Cannot carry on any self-care. Totally confined to bed or chair)  5 - Death   Eustace Pen MM, Creech RH, Tormey DC, et al. [phone]). "Toxicity and response criteria of the [facility]". [place] Oncol. 5 (6): 649-55    LABORATORY DATA:  Lab Results  Component Value Date   WBC 4.8 12/02/2021   HGB 14.0 12/02/2021   HCT 41.6 12/02/2021   MCV 92.0 12/02/2021   PLT 244 12/02/2021   Lab Results  Component Value Date   NA 137 12/02/2021   K 4.1 12/02/2021   CL 107 12/02/2021   CO2 25 12/02/2021   Lab Results  Component Value Date   ALT 12 12/02/2021   AST 18 12/02/2021   ALKPHOS 53 12/02/2021   BILITOT 0.5 12/02/2021      RADIOGRAPHY: MM Breast Surgical Specimen  Result Date: 12/23/2021 CLINICAL DATA:  Post lumpectomy specimen radiograph EXAM: SPECIMEN RADIOGRAPH OF THE RIGHT BREAST COMPARISON:  Previous exam(s). FINDINGS: Status post excision of the right breast. The radioactive seed and biopsy marker clip are present, completely intact, and were marked for pathology. IMPRESSION: Specimen radiograph of the right breast. Electronically Signed   By: Audie Pinto M.D.   On: 12/23/2021 09:12  MM RT RADIOACTIVE SEED LOC MAMMO GUIDE  Result Date: 12/22/2021 CLINICAL DATA:  46 year old female with newly diagnosed right breast invasive ductal carcinoma presenting for seed localization of the right breast. EXAM: MAMMOGRAPHIC GUIDED RADIOACTIVE SEED LOCALIZATION OF THE RIGHT BREAST COMPARISON:  Previous exam(s). FINDINGS: Patient presents for radioactive seed localization prior to right breast lumpectomy. I met with the patient and we discussed the procedure of seed localization including benefits and alternatives. We discussed the high likelihood of a successful procedure. We discussed the risks of the procedure including infection, bleeding, tissue injury and further surgery. We discussed the low dose of radioactivity  involved in the procedure. Informed, written consent was given. The usual time-out protocol was performed immediately prior to the procedure. Using mammographic guidance, sterile technique, 1% lidocaine and an I-125 radioactive seed, the ribbon biopsy marking clip in the right breast was localized using a lateral approach. The follow-up mammogram images confirm the seed in the expected location and were marked for Dr. Ninfa Linden. Follow-up survey of the patient confirms presence of the radioactive seed. Order number of I-125 seed:  [id number]. Total activity: 0.251 mCi reference Date: November 05, 2021 The patient tolerated the procedure well and was released from the [facility]. She was given instructions regarding seed removal. IMPRESSION: Radioactive seed localization right breast. No apparent complications. Electronically Signed   By: Audie Pinto M.D.   On: 12/22/2021 10:40      IMPRESSION/PLAN: 1. Stage IA, pT1bN0M0, grade 1, ER/PR positive invasive ductal carcinoma of the right breast. Dr. Lisbeth Renshaw has reviewed her final  pathology findings and today we reviewed the nature of early stage right breast disease. She has done well since surgery and does not need systemic chemo. She would benefit from external radiotherapy to the breast  to reduce risks of local recurrence followed by antiestrogen therapy. We discussed the risks, benefits, short, and long term effects of radiotherapy, as well as the curative intent, and the patient is interested in proceeding. Dr. Lisbeth Renshaw discusses the delivery and logistics of radiotherapy and discussed eithe r a 4 week course or course of 6 1/2 weeks of therapy and the differences in therapy, and insurance influence on coverage. The patient is also interested in proceeding with a planned trip later in September which would overlap with a 6 1/2 week treatment, and would prefer 4 weeks of therapy to the right breast rather than delaying the start of treatment until October. Written  consent is obtained and placed in the chart, a copy was provided to the patient. She will simulate today. 2. Contraceptive Counseling. No pregnancy testing is needed due to prior hysterectomy.  In a visit lasting 45 minutes, greater than 50% of the time was spent face to face reviewing her case, as well as in preparation of, discussing, and coordinating the patient's care.  The above documentation reflects my direct findings during this shared patient visit. Please see the separate note by Dr. Lisbeth Renshaw on this date for the remainder of the patient's plan of care.    Carola Rhine, PAC      Addendum Please see the note from Shona Simpson, PA-C from  today's visit for more details of today's encounter.  I have personally performed a face to face diagnostic evaluation on this patient and devised the following assessment and plan.  The patient returns to clinic today for follow-up for her diagnosis of breast cancer. The patient has completed a lumpectomy and will not receive chemotherapy. The patient is appropriate to proceed with adjuvant radiation treatment at this time. We discussed proceeding with a course of radiation treatment to the right breast using tangent whole breast radiation treatment fields for 4 weeks. All of her questions were answered and she wishes to proceed with treatment. The patient will proceed with simulation for treatment planning.  Staging - pT1bN0M0 IDC right breast   Kyung Rudd, MD    **Disclaimer: This note was dictated with voice recognition software. Similar sounding words can inadvertently be transcribed and this note may contain transcription errors which may not have been corrected upon publication of note.**

## 2022-01-14 NOTE — Patient Instructions (Addendum)
Closed Chain: Shoulder Abduction / Adduction - on Wall    One hand on wall, step to side and return. Stepping causes shoulder to abduct and adduct. Step _5__ times, holding 5 seconds, _2__ times per day.  http://ss.exer.us/267   Copyright  VHI. All rights reserved.  Closed Chain: Shoulder Flexion / Extension - on Wall    Hands on wall, step backward. Return. Stepping causes shoulder flexion and extension Do __5_ times, holding 5 seconds, _2__ times per day.  http://ss.exer.us/265   Copyright  VHI. All rights reserved.   [facility]  [address]  [place] [address]  [phone]  After Breast Cancer Class It is recommended you attend the ABC class to be educated on lymphedema risk reduction. This class is free of charge and lasts for 1 hour. It is a 1-time class. You will need to download the Webex app either on your phone or computer. We will send you a link the night before or the morning of the class. You should be able to click on that link to join the class. This is not a confidential class. You don't have to turn your camera on, but other participants may be able to see your email address. You are scheduled for August 21st, 2023.  Scar massage You can begin gentle scar massage to you incision sites. Gently place one hand on the incision and move the skin (without sliding on the skin) in various directions. Do this for a few minutes and then you can gently massage either coconut oil or vitamin E cream into the scars.  Compression garment You should continue wearing your compression bra until you feel like you no longer have swelling.  Home exercise Program Continue doing the exercises you were given until you feel like you can do them without feeling any tightness at the end.   Walking Program Studies show that 30 minutes of walking per day (fast enough to elevate your heart rate) can significantly reduce the risk of a cancer  recurrence. If you can't walk due to other medical reasons, we encourage you to find another activity you could do (like a stationary bike or water exercise).  Posture After breast cancer surgery, people frequently sit with rounded shoulders posture because it puts their incisions on slack and feels better. If you sit like this and scar tissue forms in that position, you can become very tight and have pain sitting or standing with good posture. Try to be aware of your posture and sit and stand up tall to heal properly.  Follow up PT: It is recommended you return every 3 months for the first 3 years following surgery to be assessed on the SOZO machine for an L-Dex score. This helps prevent clinically significant lymphedema in 95% of patients. These follow up screens are 10 minute appointments that you are not billed for. You are scheduled for Oct. 23rd at 8:10 am.

## 2022-01-15 ENCOUNTER — Encounter: Payer: Self-pay | Admitting: Physical Therapy

## 2022-01-15 ENCOUNTER — Ambulatory Visit: Payer: BC Managed Care – PPO | Admitting: Physical Therapy

## 2022-01-15 DIAGNOSIS — Z483 Aftercare following surgery for neoplasm: Secondary | ICD-10-CM

## 2022-01-15 DIAGNOSIS — R293 Abnormal posture: Secondary | ICD-10-CM

## 2022-01-15 DIAGNOSIS — Z170 Estrogen receptor positive status [ER+]: Secondary | ICD-10-CM | POA: Diagnosis not present

## 2022-01-15 DIAGNOSIS — C50411 Malignant neoplasm of upper-outer quadrant of right female breast: Secondary | ICD-10-CM | POA: Diagnosis not present

## 2022-01-15 NOTE — Therapy (Signed)
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 [name] consulting w/Dr. Gertie Fey for this   Leukopenia 09/29/2011   Parotid mass 09/28/2011   S/P excision benign mass R parotid 12/09   Past Surgical History:  Procedure Laterality Date   BREAST BIOPSY Right 11/13/2021   BREAST LUMPECTOMY WITH RADIOACTIVE SEED AND SENTINEL LYMPH NODE BIOPSY Right 12/23/2021   Procedure: RADIOACTIVE SEED GUIDED RIGHT BREAST LUMPECTOMY AND SENTINEL NODE BIOPSY;  Surgeon: Coralie Keens, MD;  Location: [facility];  Service: General;  Laterality: Right;   CESAREAN SECTION  05/31/2006   CYST EXCISION  2020   R axillary   FEMUR SURGERY Right 2009   Fibula bone grafted to R femur   FINGER FRACTURE SURGERY Right 1992   Ring finger   LAPAROSCOPIC ASSISTED VAGINAL HYSTERECTOMY N/A 10/25/2012   Procedure: LAPAROSCOPIC ASSISTED VAGINAL HYSTERECTOMY;  Surgeon: Allena Katz, MD;  Location: [facility];  Service: Gynecology;  Laterality: N/A;  with aspiration of left ovarian cyst   MASS EXCISION Left 04/2008   R parotid   TUBAL LIGATION     Feb 2012   Patient Active Problem List   Diagnosis Date Noted   Gene mutation 12/21/2021   Genetic testing 12/11/2021   Family history of  colon cancer in mother 12/02/2021   Malignant neoplasm of upper-outer quadrant of right breast in female, estrogen receptor positive ([place]) 11/26/2021   Leukopenia 09/29/2011   Chronic idiopathic monocytosis 09/29/2011   Chronic ITP (idiopathic thrombocytopenia) ([place]) 09/28/2011   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 Coralie Keens  REFERRING DIAG: Right breast cancer  THERAPY DIAG:  Aftercare following surgery for neoplasm  Abnormal posture  Malignant neoplasm of upper-outer quadrant of right breast in female, estrogen receptor positive ([place])  Rationale for Evaluation and Treatment Rehabilitation  ONSET DATE: 12/23/2021  SUBJECTIVE:  SUBJECTIVE STATEMENT: The new stretches are fine.   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 She underwent a right lumpectomy and sentinel node biopsy (1 negative node) on 12/23/2021. It is ER/PR positive and HER2 negative with a Ki67 of 10%.   PATIENT GOALS:  Reassess how my recovery is going related to arm function, pain, and swelling.  PAIN:  Are you having pain? No  PRECAUTIONS: Recent Surgery, right UE Lymphedema risk  ACTIVITY LEVEL / LEISURE: She is biking 4-5x/week for 30 min, light weights   OBJECTIVE:   PATIENT SURVEYS:  QUICK DASH:     OBSERVATIONS:  Right axillary incision is well healed. Cording present (see photo).    POSTURE:  Forward head, rounded shoulders  LYMPHEDEMA ASSESSMENT:   UPPER EXTREMITY AROM/PROM:   A/PROM RIGHT   eval   RIGHT 01/14/2022  Shoulder extension 55 65  Shoulder flexion 148 136  Shoulder abduction 155 143  Shoulder internal rotation 73 68  Shoulder external rotation 90 86                           (Blank rows = not tested)   A/PROM LEFT   eval  Shoulder extension 62  Shoulder flexion 147  Shoulder abduction 148  Shoulder internal rotation 68  Shoulder external rotation 82                          (Blank rows = not tested)     CERVICAL AROM: All within normal limits   UPPER EXTREMITY STRENGTH: WNL     LYMPHEDEMA ASSESSMENTS:    LANDMARK RIGHT   eval RIGHT 01/14/2022  10 cm proximal to olecranon process 25.2 25.2  Olecranon process 24 23.9  10 cm proximal to ulnar styloid process 20.3 19.5  Just proximal to ulnar styloid process 15.2 15  Across hand at thumb web space 19.2 19  At base of 2nd digit 6 5.7  (Blank rows = not tested)   LANDMARK LEFT   eval LEFT 01/14/2022  10 cm proximal to olecranon process 25.5 25.5  Olecranon process 23.3 23.6  10 cm proximal to ulnar styloid process 19.9 19.4  Just proximal to ulnar styloid process 14.7 14.6  Across hand at thumb web space 18.5 18.1  At base of 2nd digit 5.6 5.5  (Blank rows = not tested)        Surgery type/Date: Right lumpectomy and sentinel node biopsy 12/23/2021 Number of lymph nodes removed: 1 Current/past treatment (chemo, radiation, hormone therapy): none Other symptoms:  Heaviness/tightness No Pain No Pitting edema No Infections No Decreased scar mobility Yes Stemmer sign No  TREATMENT:  01/15/22: PROM to R shoulder in to flexion and abduction. MFR to cording in R axilla extending from scar to medial upper arm with cord becoming less visible and more pliable by end of session.   PATIENT EDUCATION:  Education details: HEP and lymphedema risk reduction Person educated: Patient Education method: Explanation, Demonstration, and Handouts Education comprehension: verbalized understanding   HOME EXERCISE PROGRAM:  Reviewed previously given post op HEP. Added closed chain flexion and abduction to regain last 12 degrees of flexion and abduction.  ASSESSMENT:  CLINICAL  IMPRESSION: Began myofascial release to cording in right axilla today. There is one thick cord extending from scar to medial upper right arm. Cord was less palpable and more pliable by end of  session. Also worked on PROM with focus on holding at end range to return to baseline ROM.   Pt will benefit from skilled therapeutic intervention to improve on the following deficits: Decreased knowledge of precautions, impaired UE functional use, pain, decreased ROM, postural dysfunction.   PT treatment/interventions: ADL/Self care home management, Therapeutic exercises, Therapeutic activity, Patient/Family education, Self Care, Manual lymph drainage, scar mobilization, Manual therapy, and Re-evaluation     GOALS: Goals reviewed with patient? Yes  LONG TERM GOALS:  (STG=LTG)  GOALS Name Target Date  Goal status  1 Pt will demonstrate she has regained full shoulder ROM and function post operatively compared to baselines.   02/11/2022 IN PROGRESS  2 Patient will increase right shoulder flexion to >/= 145 degrees for increased ease reaching overhead. 02/12/2022 INITIAL  3 Patient will increase right shoulder abduction to >/= 155 degrees for increased ease obtaining radiation positioning. 02/12/2022 INITIAL  4 Patient will demonstrate visible absence of axillary cording as noted in photo. 02/12/2022 INITIAL     PLAN: PT FREQUENCY/DURATION: 3x/week for up to 4 weeks.  PLAN FOR NEXT SESSION: Myofascial release, PROM; progress HEP if needed but focus on manual therapy   [facility]  [address], [address]  [place] [address]  [phone]  After Breast Cancer Class It is recommended you attend the ABC class to be educated on lymphedema risk reduction. This class is free of charge and lasts for 1 hour. It is a 1-time class. You will need to download the Webex app either on your phone or computer. We will send you a link the night before or the morning of the class. You should  be able to click on that link to join the class. This is not a confidential class. You don't have to turn your camera on, but other participants may be able to see your email address. You are scheduled for August 21st, 2023.  Scar massage You can begin gentle scar massage to you incision sites. Gently place one hand on the incision and move the skin (without sliding on the skin) in various directions. Do this for a few minutes and then you can gently massage either coconut oil or vitamin E cream into the scars.  Compression garment You should continue wearing your compression bra until you feel like you no longer have swelling.  Home exercise Program Continue doing the exercises you were given until you feel like you can do them without feeling any tightness at the end.   Walking Program Studies show that 30 minutes of walking per day (fast enough to elevate your heart rate) can significantly reduce the risk of a cancer recurrence. If you can't walk due to other medical reasons, we encourage you to find another activity you could do (like a stationary bike or water exercise).  Posture After breast cancer surgery, people frequently sit with rounded shoulders posture because it puts their incisions on slack and feels better. If you sit like this and scar tissue forms in that position, you can become very tight and have pain sitting or standing with good posture. Try to be aware of your posture and sit and stand up tall to heal properly.  Follow up PT: It is recommended you return every 3 months for the first 3 years following surgery to be assessed on the SOZO machine for an L-Dex score. This helps prevent clinically significant lymphedema in 95% of patients. These follow up screens are 10 minute appointments that you  are not billed for.  Annia Friendly, [place] 01/15/22 8:54 AM

## 2022-01-18 ENCOUNTER — Encounter: Payer: Self-pay | Admitting: Physical Therapy

## 2022-01-18 ENCOUNTER — Ambulatory Visit: Payer: BC Managed Care – PPO | Admitting: Physical Therapy

## 2022-01-18 DIAGNOSIS — Z170 Estrogen receptor positive status [ER+]: Secondary | ICD-10-CM | POA: Diagnosis not present

## 2022-01-18 DIAGNOSIS — Z483 Aftercare following surgery for neoplasm: Secondary | ICD-10-CM

## 2022-01-18 DIAGNOSIS — R293 Abnormal posture: Secondary | ICD-10-CM | POA: Diagnosis not present

## 2022-01-18 DIAGNOSIS — C50411 Malignant neoplasm of upper-outer quadrant of right female breast: Secondary | ICD-10-CM | POA: Diagnosis not present

## 2022-01-18 NOTE — Therapy (Signed)
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 [name] consulting w/Dr. Gertie Fey for this   Leukopenia 09/29/2011   Parotid mass 09/28/2011   S/P excision benign mass R parotid 12/09   Past Surgical History:  Procedure Laterality Date   BREAST BIOPSY Right 11/13/2021   BREAST LUMPECTOMY WITH RADIOACTIVE SEED AND SENTINEL LYMPH NODE BIOPSY Right 12/23/2021   Procedure: RADIOACTIVE SEED GUIDED RIGHT BREAST LUMPECTOMY AND SENTINEL NODE BIOPSY;  Surgeon: Coralie Keens, MD;  Location: [place];  Service: General;  Laterality: Right;   CESAREAN SECTION  05/31/2006   CYST EXCISION  2020   R axillary   FEMUR SURGERY Right 2009   Fibula bone grafted to R femur   FINGER FRACTURE SURGERY Right 1992   Ring finger   LAPAROSCOPIC ASSISTED VAGINAL HYSTERECTOMY N/A 10/25/2012   Procedure: LAPAROSCOPIC ASSISTED VAGINAL HYSTERECTOMY;  Surgeon: Allena Katz, MD;  Location: [facility];  Service: Gynecology;  Laterality: N/A;  with aspiration of left ovarian cyst   MASS EXCISION Left 04/2008   R parotid   TUBAL LIGATION     Feb 2012   Patient Active Problem List   Diagnosis Date Noted   Gene mutation 12/21/2021   Genetic testing 12/11/2021   Family history of  colon cancer in mother 12/02/2021   Malignant neoplasm of upper-outer quadrant of right breast in female, estrogen receptor positive ([place]) 11/26/2021   Leukopenia 09/29/2011   Chronic idiopathic monocytosis 09/29/2011   Chronic ITP (idiopathic thrombocytopenia) ([place]) 09/28/2011   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 Coralie Keens  REFERRING DIAG: Right breast cancer  THERAPY DIAG:  Aftercare following surgery for neoplasm  Abnormal posture  Malignant neoplasm of upper-outer quadrant of right breast in female, estrogen receptor positive ([place])  Rationale for Evaluation and Treatment Rehabilitation  ONSET DATE: 12/23/2021  SUBJECTIVE:  SUBJECTIVE STATEMENT: The cording feels a little less tight.   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 She underwent a right lumpectomy and sentinel node biopsy (1 negative node) on 12/23/2021. It is ER/PR positive and HER2 negative with a Ki67 of 10%.   PATIENT GOALS:  Reassess how my recovery is going related to arm function, pain, and swelling.  PAIN:  Are you having pain? No  PRECAUTIONS: Recent Surgery, right UE Lymphedema risk  ACTIVITY LEVEL / LEISURE: She is biking 4-5x/week for 30 min, light weights   OBJECTIVE:   PATIENT SURVEYS:  QUICK DASH:     OBSERVATIONS:  Right axillary incision is well healed. Cording present (see photo).    POSTURE:  Forward head, rounded shoulders  LYMPHEDEMA ASSESSMENT:   UPPER EXTREMITY AROM/PROM:   A/PROM RIGHT   eval   RIGHT 01/14/2022 RIGHT  01/18/22  Shoulder extension 55 65   Shoulder flexion 148 136 156  Shoulder abduction 155 143 163  Shoulder internal rotation 73 68  67  Shoulder external rotation 90 86 84                          (Blank rows = not tested)   A/PROM LEFT   eval  Shoulder extension 62  Shoulder flexion 147  Shoulder abduction 148  Shoulder internal rotation 68  Shoulder external rotation 82                          (Blank rows = not tested)     CERVICAL AROM: All within normal limits   UPPER EXTREMITY STRENGTH: WNL     LYMPHEDEMA ASSESSMENTS:    LANDMARK RIGHT   eval RIGHT 01/14/2022  10 cm proximal to olecranon process 25.2 25.2  Olecranon process 24 23.9  10 cm proximal to ulnar styloid process 20.3 19.5  Just proximal to ulnar styloid process 15.2 15  Across hand at thumb web space 19.2 19  At base of 2nd digit 6 5.7  (Blank rows = not tested)   LANDMARK LEFT   eval LEFT 01/14/2022  10 cm proximal to olecranon process 25.5 25.5  Olecranon process 23.3 23.6  10 cm proximal to ulnar styloid process 19.9 19.4  Just proximal to ulnar styloid process 14.7 14.6  Across hand at thumb web space 18.5 18.1  At base of 2nd digit 5.6 5.5  (Blank rows = not tested)        Surgery type/Date: Right lumpectomy and sentinel node biopsy 12/23/2021 Number of lymph nodes removed: 1 Current/past treatment (chemo, radiation, hormone therapy): none Other symptoms:  Heaviness/tightness No Pain No Pitting edema No Infections No Decreased scar mobility Yes Stemmer sign No  TREATMENT:  01/18/22: PROM to R shoulder in to flexion and abduction. MFR to cording in R axilla extending from scar to medial upper arm with cord becoming less visible and more pliable by end of session.   01/15/22: PROM to R shoulder in to flexion and abduction. MFR to cording in R axilla extending from scar to medial upper arm with cord becoming less visible and more pliable by end of session.   PATIENT EDUCATION:  Education details: HEP and lymphedema risk reduction Person educated: Patient Education method: Explanation, Demonstration, and  Handouts Education comprehension: verbalized understanding   HOME EXERCISE PROGRAM:  Reviewed previously given post op HEP. Added closed chain flexion and  abduction to regain last 12 degrees of flexion and abduction.  ASSESSMENT:  CLINICAL IMPRESSION: Continued myofascial release to cording in right axilla today. There continues to be one thick cord extending from scar to medial upper right arm. Cord was less palpable and more pliable by end of session. Remeasured R shoulder ROM at beginning of session and it is now better than baseline.   Pt will benefit from skilled therapeutic intervention to improve on the following deficits: Decreased knowledge of precautions, impaired UE functional use, pain, decreased ROM, postural dysfunction.   PT treatment/interventions: ADL/Self care home management, Therapeutic exercises, Therapeutic activity, Patient/Family education, Self Care, Manual lymph drainage, scar mobilization, Manual therapy, and Re-evaluation     GOALS: Goals reviewed with patient? Yes  LONG TERM GOALS:  (STG=LTG)  GOALS Name Target Date  Goal status  1 Pt will demonstrate she has regained full shoulder ROM and function post operatively compared to baselines.   02/11/2022 MET 01/18/22- returned to baseline  2 Patient will increase right shoulder flexion to >/= 145 degrees for increased ease reaching overhead. 02/15/2022 MET 01/18/22- 156 degrees of flexion  3 Patient will increase right shoulder abduction to >/= 155 degrees for increased ease obtaining radiation positioning. 02/15/2022 MET 01/18/22- 163 degrees of abduction  4 Patient will demonstrate visible absence of axillary cording as noted in photo. 02/15/2022 INITIAL     PLAN: PT FREQUENCY/DURATION: 3x/week for up to 4 weeks.  PLAN FOR NEXT SESSION: Myofascial release, PROM; progress HEP if needed but focus on manual therapy   [facility]  [address], [address]  [place] [address]  [phone]  After Breast Cancer Class It is recommended you attend the ABC class to be educated on lymphedema risk reduction. This class is free of charge and lasts for 1 hour. It is a 1-time class. You will need to download the Webex app either on your phone or computer. We will send you a link the night before or the morning of the class. You should be able to click on that link to join the class. This is not a confidential class. You don't have to turn your camera on, but other participants may be able to see your email address. You are scheduled for August 21st, 2023.  Scar massage You can begin gentle scar massage to you incision sites. Gently place one hand on the incision and move the skin (without sliding on the skin) in various directions. Do this for a few minutes and then you can gently massage either coconut oil or vitamin E cream into the scars.  Compression garment You should continue wearing your compression bra until you feel like you no longer have swelling.  Home exercise Program Continue doing the exercises you were given until you feel like you can do them without feeling any tightness at the end.   Walking Program Studies show that 30 minutes of walking per day (fast enough to elevate your heart rate) can significantly reduce the risk of a cancer recurrence. If you can't walk due to other medical reasons, we encourage you to find another activity you could do (like a stationary bike or water exercise).  Posture After breast cancer surgery, people frequently sit with rounded shoulders posture because it puts their incisions on slack and feels better. If you sit like this and scar tissue forms in that position, you can become very tight and have pain sitting or standing with good posture. Try to be aware of  your posture and sit and stand up tall to heal properly.  Follow up PT: It is recommended you return every 3 months for the first 3 years following surgery to be assessed on  the SOZO machine for an L-Dex score. This helps prevent clinically significant lymphedema in 95% of patients. These follow up screens are 10 minute appointments that you are not billed for.  Annia Friendly, [place] 01/18/22 8:51 AM

## 2022-01-19 ENCOUNTER — Ambulatory Visit: Payer: BC Managed Care – PPO

## 2022-01-19 DIAGNOSIS — Z170 Estrogen receptor positive status [ER+]: Secondary | ICD-10-CM | POA: Diagnosis not present

## 2022-01-19 DIAGNOSIS — C50411 Malignant neoplasm of upper-outer quadrant of right female breast: Secondary | ICD-10-CM | POA: Diagnosis not present

## 2022-01-19 DIAGNOSIS — Z483 Aftercare following surgery for neoplasm: Secondary | ICD-10-CM | POA: Diagnosis not present

## 2022-01-19 DIAGNOSIS — R293 Abnormal posture: Secondary | ICD-10-CM

## 2022-01-19 NOTE — Therapy (Signed)
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 [name] consulting w/Dr. Gertie Fey for this   Leukopenia 09/29/2011   Parotid mass 09/28/2011   S/P excision benign mass R parotid 12/09   Past Surgical History:  Procedure Laterality Date   BREAST BIOPSY Right 11/13/2021   BREAST LUMPECTOMY WITH RADIOACTIVE SEED AND SENTINEL LYMPH NODE BIOPSY Right 12/23/2021   Procedure: RADIOACTIVE SEED GUIDED RIGHT BREAST LUMPECTOMY AND SENTINEL NODE BIOPSY;  Surgeon: Coralie Keens, MD;  Location: [place];  Service: General;  Laterality: Right;   CESAREAN SECTION  05/31/2006   CYST EXCISION  2020   R axillary   FEMUR SURGERY Right 2009   Fibula bone grafted to R femur   FINGER FRACTURE SURGERY Right 1992   Ring finger   LAPAROSCOPIC ASSISTED VAGINAL HYSTERECTOMY N/A 10/25/2012   Procedure: LAPAROSCOPIC ASSISTED VAGINAL HYSTERECTOMY;  Surgeon: Allena Katz, MD;  Location: [facility] [facility];  Service: Gynecology;  Laterality: N/A;  with aspiration of left ovarian cyst   MASS EXCISION Left 04/2008   R parotid   TUBAL LIGATION     Feb 2012   Patient Active Problem List   Diagnosis Date Noted   Gene mutation 12/21/2021   Genetic testing 12/11/2021   Family history of  colon cancer in mother 12/02/2021   Malignant neoplasm of upper-outer quadrant of right breast in female, estrogen receptor positive ([place]) 11/26/2021   Leukopenia 09/29/2011   Chronic idiopathic monocytosis 09/29/2011   Chronic ITP (idiopathic thrombocytopenia) ([place]) 09/28/2011   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 Coralie Keens  REFERRING DIAG: Right breast cancer  THERAPY DIAG:  Aftercare following surgery for neoplasm  Abnormal posture  Malignant neoplasm of upper-outer quadrant of right breast in female, estrogen receptor positive ([place])  Rationale for Evaluation and Treatment Rehabilitation  ONSET DATE: 12/23/2021  SUBJECTIVE:  SUBJECTIVE STATEMENT: I felt fine after yesterday.  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 She underwent a right lumpectomy and sentinel node biopsy (1 negative node) on 12/23/2021. It is ER/PR positive and HER2 negative with a Ki67 of 10%.   PATIENT GOALS:  Reassess how my recovery is going related to arm function, pain, and swelling.  PAIN:  Are you having pain? No  PRECAUTIONS: Recent Surgery, right UE Lymphedema risk  ACTIVITY LEVEL / LEISURE: She is biking 4-5x/week for 30 min, light weights   OBJECTIVE:   PATIENT SURVEYS:  QUICK DASH:     OBSERVATIONS:  Right axillary incision is well healed. Cording present (see photo).    POSTURE:  Forward head, rounded shoulders  LYMPHEDEMA ASSESSMENT:   UPPER EXTREMITY AROM/PROM:   A/PROM RIGHT   eval   RIGHT 01/14/2022 RIGHT  01/18/22  Shoulder extension 55 65   Shoulder flexion 148 136 156  Shoulder abduction 155 143 163  Shoulder internal rotation 73 68 67   Shoulder external rotation 90 86 84                          (Blank rows = not tested)   A/PROM LEFT   eval  Shoulder extension 62  Shoulder flexion 147  Shoulder abduction 148  Shoulder internal rotation 68  Shoulder external rotation 82                          (Blank rows = not tested)     CERVICAL AROM: All within normal limits   UPPER EXTREMITY STRENGTH: WNL     LYMPHEDEMA ASSESSMENTS:    LANDMARK RIGHT   eval RIGHT 01/14/2022  10 cm proximal to olecranon process 25.2 25.2  Olecranon process 24 23.9  10 cm proximal to ulnar styloid process 20.3 19.5  Just proximal to ulnar styloid process 15.2 15  Across hand at thumb web space 19.2 19  At base of 2nd digit 6 5.7  (Blank rows = not tested)   LANDMARK LEFT   eval LEFT 01/14/2022  10 cm proximal to olecranon process 25.5 25.5  Olecranon process 23.3 23.6  10 cm proximal to ulnar styloid process 19.9 19.4  Just proximal to ulnar styloid process 14.7 14.6  Across hand at thumb web space 18.5 18.1  At base of 2nd digit 5.6 5.5  (Blank rows = not tested)        Surgery type/Date: Right lumpectomy and sentinel node biopsy 12/23/2021 Number of lymph nodes removed: 1 Current/past treatment (chemo, radiation, hormone therapy): none Other symptoms:  Heaviness/tightness No Pain No Pitting edema No Infections No Decreased scar mobility Yes Stemmer sign No  TREATMENT:  01/19/22: Manual Therapy PROM to R shoulder in to flexion and abduction MFR to cording in R axilla extending from scar to medial upper arm with cord becoming less visible and more pliable by end of session.   01/18/22: PROM to R shoulder in to flexion and abduction. MFR to cording in R axilla extending from scar to medial upper arm with cord becoming less visible and more pliable by end of session.   01/15/22: PROM to R shoulder in to flexion and abduction. MFR to cording in R axilla extending from scar to medial upper arm with cord becoming less  visible and more pliable by end of session.   PATIENT EDUCATION:  Education  details: HEP and lymphedema risk reduction Person educated: Patient Education method: Explanation, Demonstration, and Handouts Education comprehension: verbalized understanding   HOME EXERCISE PROGRAM:  Reviewed previously given post op HEP. Added closed chain flexion and abduction to regain last 12 degrees of flexion and abduction.  ASSESSMENT:  CLINICAL IMPRESSION: Continued myofascial release to cording in right axilla today. Pt continues with one thick cord but this is much improved from initial picture in chart. Pt also reports feeling more A/ROM and less tightness with end ROM stretching now. She has her radiation simulation tomorrow so will be placed on hold while undergoing radiation and knows she can return to this clinic at any time prn.   Pt will benefit from skilled therapeutic intervention to improve on the following deficits: Decreased knowledge of precautions, impaired UE functional use, pain, decreased ROM, postural dysfunction.   PT treatment/interventions: ADL/Self care home management, Therapeutic exercises, Therapeutic activity, Patient/Family education, Self Care, Manual lymph drainage, scar mobilization, Manual therapy, and Re-evaluation     GOALS: Goals reviewed with patient? Yes  LONG TERM GOALS:  (STG=LTG)  GOALS Name Target Date  Goal status  1 Pt will demonstrate she has regained full shoulder ROM and function post operatively compared to baselines.   02/11/2022 MET 01/18/22- returned to baseline  2 Patient will increase right shoulder flexion to >/= 145 degrees for increased ease reaching overhead. 02/15/2022 MET 01/18/22- 156 degrees of flexion  3 Patient will increase right shoulder abduction to >/= 155 degrees for increased ease obtaining radiation positioning. 02/15/2022 MET 01/18/22- 163 degrees of abduction  4 Patient will demonstrate visible absence of axillary cording as noted  in photo. 02/15/2022 INITIAL     PLAN: PT FREQUENCY/DURATION: 3x/week for up to 4 weeks.  PLAN FOR NEXT SESSION: Pt on hold while undergoing radiation; Assess cording if she returns and cont Myofascial release, PROM; progress HEP if needed but focus on manual therapy   Collie Siad, PTA 01/19/22 10:03 AM   [facility]  [address]  [place] [address]  [phone]

## 2022-01-20 ENCOUNTER — Ambulatory Visit
Admission: RE | Admit: 2022-01-20 | Discharge: 2022-01-20 | Disposition: A | Discharge status: Home / Self Care | Payer: BC Managed Care – PPO | Source: Ambulatory Visit | Attending: Radiation Oncology | Admitting: Radiation Oncology

## 2022-01-20 ENCOUNTER — Other Ambulatory Visit: Payer: Self-pay

## 2022-01-20 ENCOUNTER — Encounter: Payer: Self-pay | Admitting: Radiation Oncology

## 2022-01-20 VITALS — BP 110/62 | HR 73 | Temp 97.1°F | Resp 18 | Ht 65.0 in | Wt 130.5 lb

## 2022-01-20 DIAGNOSIS — Z79899 Other long term (current) drug therapy: Secondary | ICD-10-CM | POA: Diagnosis not present

## 2022-01-20 DIAGNOSIS — D693 Immune thrombocytopenic purpura: Secondary | ICD-10-CM | POA: Insufficient documentation

## 2022-01-20 DIAGNOSIS — C50411 Malignant neoplasm of upper-outer quadrant of right female breast: Secondary | ICD-10-CM | POA: Insufficient documentation

## 2022-01-20 DIAGNOSIS — D72819 Decreased white blood cell count, unspecified: Secondary | ICD-10-CM | POA: Insufficient documentation

## 2022-01-20 DIAGNOSIS — Z170 Estrogen receptor positive status [ER+]: Secondary | ICD-10-CM | POA: Diagnosis not present

## 2022-01-20 DIAGNOSIS — Z800 Family history of malignant neoplasm of digestive organs: Secondary | ICD-10-CM | POA: Diagnosis not present

## 2022-01-20 DIAGNOSIS — Z510 Encounter for antineoplastic radiation therapy: Secondary | ICD-10-CM | POA: Diagnosis not present

## 2022-01-20 NOTE — Progress Notes (Signed)
Consult appointment. I verified patient's identity and began nursing interview w/ spouse Mr. [name] in attendance. Patient reports cording discomfort 1/10, RT arm/axilla, worsened post PT on 01/19/22. No other issues reported at this time.  Meaningful use complete. Hysterectomy- NO chance of pregnancy.  BP 111/78 (BP Location: Left Arm, Patient Position: Sitting)   Pulse 66   Temp (!) 97.1 F (36.2 C) (Temporal)   Resp 18   Ht '5\' 5"'$  (1.651 m)   Wt 130 lb 8 oz (59.2 kg)   LMP 10/16/2012   SpO2 100%   BMI 21.72 kg/m

## 2022-01-22 NOTE — Addendum Note (Signed)
Encounter addended by: Kyung Rudd, MD on: 01/22/2022 8:00 AM  Actions taken: Clinical Note Signed

## 2022-01-26 ENCOUNTER — Encounter: Payer: Self-pay | Admitting: *Deleted

## 2022-01-26 DIAGNOSIS — Z170 Estrogen receptor positive status [ER+]: Secondary | ICD-10-CM | POA: Diagnosis not present

## 2022-01-26 DIAGNOSIS — Z510 Encounter for antineoplastic radiation therapy: Secondary | ICD-10-CM | POA: Diagnosis not present

## 2022-01-26 DIAGNOSIS — C50411 Malignant neoplasm of upper-outer quadrant of right female breast: Secondary | ICD-10-CM | POA: Diagnosis not present

## 2022-01-27 ENCOUNTER — Other Ambulatory Visit: Payer: Self-pay

## 2022-01-27 ENCOUNTER — Ambulatory Visit
Admission: RE | Admit: 2022-01-27 | Discharge: 2022-01-27 | Disposition: A | Discharge status: Home / Self Care | Payer: BC Managed Care – PPO | Source: Ambulatory Visit | Attending: Radiation Oncology | Admitting: Radiation Oncology

## 2022-01-27 DIAGNOSIS — Z510 Encounter for antineoplastic radiation therapy: Secondary | ICD-10-CM | POA: Diagnosis not present

## 2022-01-27 DIAGNOSIS — Z170 Estrogen receptor positive status [ER+]: Secondary | ICD-10-CM | POA: Diagnosis not present

## 2022-01-27 DIAGNOSIS — C50411 Malignant neoplasm of upper-outer quadrant of right female breast: Secondary | ICD-10-CM | POA: Diagnosis not present

## 2022-01-27 LAB — RAD ONC ARIA SESSION SUMMARY
Course Elapsed Days: 0
Plan Fractions Treated to Date: 1
Plan Prescribed Dose Per Fraction: 2.66 Gy
Plan Total Fractions Prescribed: 16
Plan Total Prescribed Dose: 42.56 Gy
Reference Point Dosage Given to Date: 2.66 Gy
Reference Point Session Dosage Given: 2.66 Gy
Session Number: 1

## 2022-01-28 ENCOUNTER — Ambulatory Visit
Admission: RE | Admit: 2022-01-28 | Discharge: 2022-01-28 | Disposition: A | Discharge status: Home / Self Care | Payer: BC Managed Care – PPO | Source: Ambulatory Visit | Attending: Radiation Oncology | Admitting: Radiation Oncology

## 2022-01-28 ENCOUNTER — Other Ambulatory Visit: Payer: Self-pay

## 2022-01-28 DIAGNOSIS — Z170 Estrogen receptor positive status [ER+]: Secondary | ICD-10-CM | POA: Diagnosis not present

## 2022-01-28 DIAGNOSIS — Z510 Encounter for antineoplastic radiation therapy: Secondary | ICD-10-CM | POA: Diagnosis not present

## 2022-01-28 DIAGNOSIS — C50411 Malignant neoplasm of upper-outer quadrant of right female breast: Secondary | ICD-10-CM | POA: Diagnosis not present

## 2022-01-28 LAB — RAD ONC ARIA SESSION SUMMARY
Course Elapsed Days: 1
Plan Fractions Treated to Date: 2
Plan Prescribed Dose Per Fraction: 2.66 Gy
Plan Total Fractions Prescribed: 16
Plan Total Prescribed Dose: 42.56 Gy
Reference Point Dosage Given to Date: 5.32 Gy
Reference Point Session Dosage Given: 2.66 Gy
Session Number: 2

## 2022-01-28 NOTE — Progress Notes (Signed)
Pt here for patient teaching.  Pt given Radiation and You booklet, skin care instructions, alra deodorant and Radiaplex.    Reviewed areas of pertinence such as fatigue, hair loss, skin changes, breast tenderness, and breast swelling. Pt able to give teach back of to pat skin and use unscented/gentle soap,apply Radiaplex bid, avoid applying anything to skin within 4 hours of treatment, avoid wearing an under wire bra, and to use an electric razor if they must shave. Pt verbalizes understanding of information given and will contact nursing with any questions or concerns.    Stevenson Windmiller M. Yekaterina Escutia RN, BSN  

## 2022-01-29 ENCOUNTER — Ambulatory Visit
Admission: RE | Admit: 2022-01-29 | Discharge: 2022-01-29 | Disposition: A | Discharge status: Home / Self Care | Payer: BC Managed Care – PPO | Source: Ambulatory Visit | Attending: Radiation Oncology | Admitting: Radiation Oncology

## 2022-01-29 ENCOUNTER — Other Ambulatory Visit: Payer: Self-pay

## 2022-01-29 DIAGNOSIS — Z510 Encounter for antineoplastic radiation therapy: Secondary | ICD-10-CM | POA: Insufficient documentation

## 2022-01-29 DIAGNOSIS — C50411 Malignant neoplasm of upper-outer quadrant of right female breast: Secondary | ICD-10-CM | POA: Insufficient documentation

## 2022-01-29 DIAGNOSIS — Z170 Estrogen receptor positive status [ER+]: Secondary | ICD-10-CM | POA: Insufficient documentation

## 2022-01-29 LAB — RAD ONC ARIA SESSION SUMMARY
Course Elapsed Days: 2
Plan Fractions Treated to Date: 3
Plan Prescribed Dose Per Fraction: 2.66 Gy
Plan Total Fractions Prescribed: 16
Plan Total Prescribed Dose: 42.56 Gy
Reference Point Dosage Given to Date: 7.98 Gy
Reference Point Session Dosage Given: 2.66 Gy
Session Number: 3

## 2022-01-29 MED ORDER — RADIAPLEXRX EX GEL: Freq: Once | CUTANEOUS | Status: AC

## 2022-01-29 MED ORDER — ALRA NON-METALLIC DEODORANT (RAD-ONC)
1.0000 | Freq: Once | TOPICAL | Status: AC
  Administered 2022-01-29: 1 via TOPICAL

## 2022-02-01 DIAGNOSIS — Z923 Personal history of irradiation: Secondary | ICD-10-CM

## 2022-02-01 HISTORY — DX: Personal history of irradiation: Z92.3

## 2022-02-02 ENCOUNTER — Other Ambulatory Visit: Payer: Self-pay

## 2022-02-02 ENCOUNTER — Ambulatory Visit
Admission: RE | Admit: 2022-02-02 | Discharge: 2022-02-02 | Disposition: A | Discharge status: Home / Self Care | Payer: BC Managed Care – PPO | Source: Ambulatory Visit | Attending: Radiation Oncology | Admitting: Radiation Oncology

## 2022-02-02 DIAGNOSIS — Z170 Estrogen receptor positive status [ER+]: Secondary | ICD-10-CM | POA: Diagnosis not present

## 2022-02-02 DIAGNOSIS — Z510 Encounter for antineoplastic radiation therapy: Secondary | ICD-10-CM | POA: Diagnosis not present

## 2022-02-02 DIAGNOSIS — C50411 Malignant neoplasm of upper-outer quadrant of right female breast: Secondary | ICD-10-CM | POA: Diagnosis not present

## 2022-02-02 LAB — RAD ONC ARIA SESSION SUMMARY
Course Elapsed Days: 6
Plan Fractions Treated to Date: 4
Plan Prescribed Dose Per Fraction: 2.66 Gy
Plan Total Fractions Prescribed: 16
Plan Total Prescribed Dose: 42.56 Gy
Reference Point Dosage Given to Date: 10.64 Gy
Reference Point Session Dosage Given: 2.66 Gy
Session Number: 4

## 2022-02-03 ENCOUNTER — Other Ambulatory Visit: Payer: Self-pay

## 2022-02-03 ENCOUNTER — Ambulatory Visit
Admission: RE | Admit: 2022-02-03 | Discharge: 2022-02-03 | Disposition: A | Discharge status: Home / Self Care | Payer: BC Managed Care – PPO | Source: Ambulatory Visit | Attending: Radiation Oncology | Admitting: Radiation Oncology

## 2022-02-03 DIAGNOSIS — Z510 Encounter for antineoplastic radiation therapy: Secondary | ICD-10-CM | POA: Diagnosis not present

## 2022-02-03 DIAGNOSIS — Z170 Estrogen receptor positive status [ER+]: Secondary | ICD-10-CM | POA: Diagnosis not present

## 2022-02-03 DIAGNOSIS — C50411 Malignant neoplasm of upper-outer quadrant of right female breast: Secondary | ICD-10-CM | POA: Diagnosis not present

## 2022-02-03 LAB — RAD ONC ARIA SESSION SUMMARY
Course Elapsed Days: 7
Plan Fractions Treated to Date: 5
Plan Prescribed Dose Per Fraction: 2.66 Gy
Plan Total Fractions Prescribed: 16
Plan Total Prescribed Dose: 42.56 Gy
Reference Point Dosage Given to Date: 13.3 Gy
Reference Point Session Dosage Given: 2.66 Gy
Session Number: 5

## 2022-02-04 ENCOUNTER — Ambulatory Visit
Admission: RE | Admit: 2022-02-04 | Discharge: 2022-02-04 | Disposition: A | Discharge status: Home / Self Care | Payer: BC Managed Care – PPO | Source: Ambulatory Visit | Attending: Radiation Oncology | Admitting: Radiation Oncology

## 2022-02-04 ENCOUNTER — Other Ambulatory Visit: Payer: Self-pay

## 2022-02-04 DIAGNOSIS — Z170 Estrogen receptor positive status [ER+]: Secondary | ICD-10-CM | POA: Diagnosis not present

## 2022-02-04 DIAGNOSIS — Z510 Encounter for antineoplastic radiation therapy: Secondary | ICD-10-CM | POA: Diagnosis not present

## 2022-02-04 DIAGNOSIS — C50411 Malignant neoplasm of upper-outer quadrant of right female breast: Secondary | ICD-10-CM | POA: Diagnosis not present

## 2022-02-04 LAB — RAD ONC ARIA SESSION SUMMARY
Course Elapsed Days: 8
Plan Fractions Treated to Date: 6
Plan Prescribed Dose Per Fraction: 2.66 Gy
Plan Total Fractions Prescribed: 16
Plan Total Prescribed Dose: 42.56 Gy
Reference Point Dosage Given to Date: 15.96 Gy
Reference Point Session Dosage Given: 2.66 Gy
Session Number: 6

## 2022-02-05 ENCOUNTER — Ambulatory Visit: Payer: BC Managed Care – PPO

## 2022-02-05 ENCOUNTER — Other Ambulatory Visit: Payer: Self-pay

## 2022-02-05 ENCOUNTER — Ambulatory Visit
Admission: RE | Admit: 2022-02-05 | Discharge: 2022-02-05 | Disposition: A | Discharge status: Home / Self Care | Payer: BC Managed Care – PPO | Source: Ambulatory Visit | Attending: Radiation Oncology | Admitting: Radiation Oncology

## 2022-02-05 DIAGNOSIS — Z510 Encounter for antineoplastic radiation therapy: Secondary | ICD-10-CM | POA: Diagnosis not present

## 2022-02-05 DIAGNOSIS — C50411 Malignant neoplasm of upper-outer quadrant of right female breast: Secondary | ICD-10-CM | POA: Diagnosis not present

## 2022-02-05 DIAGNOSIS — Z170 Estrogen receptor positive status [ER+]: Secondary | ICD-10-CM | POA: Diagnosis not present

## 2022-02-05 LAB — RAD ONC ARIA SESSION SUMMARY
Course Elapsed Days: 9
Plan Fractions Treated to Date: 7
Plan Prescribed Dose Per Fraction: 2.66 Gy
Plan Total Fractions Prescribed: 16
Plan Total Prescribed Dose: 42.56 Gy
Reference Point Dosage Given to Date: 18.62 Gy
Reference Point Session Dosage Given: 2.66 Gy
Session Number: 7

## 2022-02-08 ENCOUNTER — Ambulatory Visit
Admission: RE | Admit: 2022-02-08 | Discharge: 2022-02-08 | Disposition: A | Discharge status: Home / Self Care | Payer: BC Managed Care – PPO | Source: Ambulatory Visit | Attending: Radiation Oncology | Admitting: Radiation Oncology

## 2022-02-08 ENCOUNTER — Other Ambulatory Visit: Payer: Self-pay

## 2022-02-08 DIAGNOSIS — C50411 Malignant neoplasm of upper-outer quadrant of right female breast: Secondary | ICD-10-CM | POA: Diagnosis not present

## 2022-02-08 DIAGNOSIS — Z170 Estrogen receptor positive status [ER+]: Secondary | ICD-10-CM | POA: Diagnosis not present

## 2022-02-08 DIAGNOSIS — Z510 Encounter for antineoplastic radiation therapy: Secondary | ICD-10-CM | POA: Diagnosis not present

## 2022-02-08 LAB — RAD ONC ARIA SESSION SUMMARY
Course Elapsed Days: 12
Plan Fractions Treated to Date: 8
Plan Prescribed Dose Per Fraction: 2.66 Gy
Plan Total Fractions Prescribed: 16
Plan Total Prescribed Dose: 42.56 Gy
Reference Point Dosage Given to Date: 21.28 Gy
Reference Point Session Dosage Given: 2.66 Gy
Session Number: 8

## 2022-02-09 ENCOUNTER — Other Ambulatory Visit: Payer: Self-pay

## 2022-02-09 ENCOUNTER — Ambulatory Visit
Admission: RE | Admit: 2022-02-09 | Discharge: 2022-02-09 | Disposition: A | Discharge status: Home / Self Care | Payer: BC Managed Care – PPO | Source: Ambulatory Visit | Attending: Radiation Oncology | Admitting: Radiation Oncology

## 2022-02-09 DIAGNOSIS — Z510 Encounter for antineoplastic radiation therapy: Secondary | ICD-10-CM | POA: Diagnosis not present

## 2022-02-09 DIAGNOSIS — Z170 Estrogen receptor positive status [ER+]: Secondary | ICD-10-CM | POA: Diagnosis not present

## 2022-02-09 DIAGNOSIS — C50411 Malignant neoplasm of upper-outer quadrant of right female breast: Secondary | ICD-10-CM | POA: Diagnosis not present

## 2022-02-09 LAB — RAD ONC ARIA SESSION SUMMARY
Course Elapsed Days: 13
Plan Fractions Treated to Date: 9
Plan Prescribed Dose Per Fraction: 2.66 Gy
Plan Total Fractions Prescribed: 16
Plan Total Prescribed Dose: 42.56 Gy
Reference Point Dosage Given to Date: 23.94 Gy
Reference Point Session Dosage Given: 2.66 Gy
Session Number: 9

## 2022-02-10 ENCOUNTER — Other Ambulatory Visit: Payer: Self-pay

## 2022-02-10 ENCOUNTER — Ambulatory Visit
Admission: RE | Admit: 2022-02-10 | Discharge: 2022-02-10 | Disposition: A | Discharge status: Home / Self Care | Payer: BC Managed Care – PPO | Source: Ambulatory Visit | Attending: Radiation Oncology | Admitting: Radiation Oncology

## 2022-02-10 DIAGNOSIS — Z510 Encounter for antineoplastic radiation therapy: Secondary | ICD-10-CM | POA: Diagnosis not present

## 2022-02-10 DIAGNOSIS — C50411 Malignant neoplasm of upper-outer quadrant of right female breast: Secondary | ICD-10-CM | POA: Diagnosis not present

## 2022-02-10 DIAGNOSIS — Z170 Estrogen receptor positive status [ER+]: Secondary | ICD-10-CM | POA: Diagnosis not present

## 2022-02-10 LAB — RAD ONC ARIA SESSION SUMMARY
Course Elapsed Days: 14
Plan Fractions Treated to Date: 10
Plan Prescribed Dose Per Fraction: 2.66 Gy
Plan Total Fractions Prescribed: 16
Plan Total Prescribed Dose: 42.56 Gy
Reference Point Dosage Given to Date: 26.6 Gy
Reference Point Session Dosage Given: 2.66 Gy
Session Number: 10

## 2022-02-11 ENCOUNTER — Encounter: Payer: Self-pay | Admitting: Radiation Oncology

## 2022-02-11 ENCOUNTER — Ambulatory Visit
Admission: RE | Admit: 2022-02-11 | Discharge: 2022-02-11 | Disposition: A | Discharge status: Home / Self Care | Payer: BC Managed Care – PPO | Source: Ambulatory Visit | Attending: Radiation Oncology | Admitting: Radiation Oncology

## 2022-02-11 ENCOUNTER — Other Ambulatory Visit: Payer: Self-pay

## 2022-02-11 DIAGNOSIS — Z170 Estrogen receptor positive status [ER+]: Secondary | ICD-10-CM | POA: Diagnosis not present

## 2022-02-11 DIAGNOSIS — Z510 Encounter for antineoplastic radiation therapy: Secondary | ICD-10-CM | POA: Diagnosis not present

## 2022-02-11 DIAGNOSIS — C50411 Malignant neoplasm of upper-outer quadrant of right female breast: Secondary | ICD-10-CM | POA: Diagnosis not present

## 2022-02-11 LAB — RAD ONC ARIA SESSION SUMMARY
Course Elapsed Days: 15
Plan Fractions Treated to Date: 11
Plan Prescribed Dose Per Fraction: 2.66 Gy
Plan Total Fractions Prescribed: 16
Plan Total Prescribed Dose: 42.56 Gy
Reference Point Dosage Given to Date: 29.26 Gy
Reference Point Session Dosage Given: 2.66 Gy
Session Number: 11

## 2022-02-12 ENCOUNTER — Ambulatory Visit
Admission: RE | Admit: 2022-02-12 | Discharge: 2022-02-12 | Disposition: A | Discharge status: Home / Self Care | Payer: BC Managed Care – PPO | Source: Ambulatory Visit | Attending: Radiation Oncology | Admitting: Radiation Oncology

## 2022-02-12 ENCOUNTER — Other Ambulatory Visit: Payer: Self-pay

## 2022-02-12 DIAGNOSIS — Z170 Estrogen receptor positive status [ER+]: Secondary | ICD-10-CM | POA: Diagnosis not present

## 2022-02-12 DIAGNOSIS — C50411 Malignant neoplasm of upper-outer quadrant of right female breast: Secondary | ICD-10-CM | POA: Diagnosis not present

## 2022-02-12 DIAGNOSIS — Z510 Encounter for antineoplastic radiation therapy: Secondary | ICD-10-CM | POA: Diagnosis not present

## 2022-02-12 LAB — RAD ONC ARIA SESSION SUMMARY
Course Elapsed Days: 16
Plan Fractions Treated to Date: 12
Plan Prescribed Dose Per Fraction: 2.66 Gy
Plan Total Fractions Prescribed: 16
Plan Total Prescribed Dose: 42.56 Gy
Reference Point Dosage Given to Date: 31.92 Gy
Reference Point Session Dosage Given: 2.66 Gy
Session Number: 12

## 2022-02-15 ENCOUNTER — Ambulatory Visit
Admission: RE | Admit: 2022-02-15 | Discharge: 2022-02-15 | Disposition: A | Discharge status: Home / Self Care | Payer: BC Managed Care – PPO | Source: Ambulatory Visit | Attending: Radiation Oncology | Admitting: Radiation Oncology

## 2022-02-15 ENCOUNTER — Other Ambulatory Visit: Payer: Self-pay

## 2022-02-15 DIAGNOSIS — Z510 Encounter for antineoplastic radiation therapy: Secondary | ICD-10-CM | POA: Diagnosis not present

## 2022-02-15 DIAGNOSIS — Z170 Estrogen receptor positive status [ER+]: Secondary | ICD-10-CM | POA: Diagnosis not present

## 2022-02-15 DIAGNOSIS — C50411 Malignant neoplasm of upper-outer quadrant of right female breast: Secondary | ICD-10-CM | POA: Diagnosis not present

## 2022-02-15 LAB — RAD ONC ARIA SESSION SUMMARY
Course Elapsed Days: 19
Plan Fractions Treated to Date: 13
Plan Prescribed Dose Per Fraction: 2.66 Gy
Plan Total Fractions Prescribed: 16
Plan Total Prescribed Dose: 42.56 Gy
Reference Point Dosage Given to Date: 34.58 Gy
Reference Point Session Dosage Given: 2.66 Gy
Session Number: 13

## 2022-02-15 NOTE — Progress Notes (Signed)
  Radiation Oncology         [id number]) [phone] ________________________________  Name: [name] MRN: [id number]  Date: 02/11/2022  DOB: [birth year]  SIMULATION NOTE   NARRATIVE:  The patient underwent simulation today for ongoing radiation therapy.  The existing CT study set was employed for the purpose of virtual treatment planning.  The target and avoidance structures were reviewed and modified as necessary.  Treatment planning then occurred.  The radiation boost prescription was entered and confirmed.  A total of 3 complex treatment devices were fabricated in the form of multi-leaf collimators to shape radiation around the targets while maximally excluding nearby normal structures. I have requested : Isodose Plan.    PLAN:  This modified radiation beam arrangement is intended to continue the current radiation dose to an additional 8 Gy in 4 fractions for a total cumulative dose of 50.56 Gy.    ------------------------------------------------  Jodelle Gross, MD, PhD

## 2022-02-16 ENCOUNTER — Other Ambulatory Visit: Payer: Self-pay

## 2022-02-16 ENCOUNTER — Ambulatory Visit
Admission: RE | Admit: 2022-02-16 | Discharge: 2022-02-16 | Disposition: A | Discharge status: Home / Self Care | Payer: BC Managed Care – PPO | Source: Ambulatory Visit | Attending: Radiation Oncology | Admitting: Radiation Oncology

## 2022-02-16 DIAGNOSIS — Z170 Estrogen receptor positive status [ER+]: Secondary | ICD-10-CM | POA: Diagnosis not present

## 2022-02-16 DIAGNOSIS — Z510 Encounter for antineoplastic radiation therapy: Secondary | ICD-10-CM | POA: Diagnosis not present

## 2022-02-16 DIAGNOSIS — C50411 Malignant neoplasm of upper-outer quadrant of right female breast: Secondary | ICD-10-CM | POA: Diagnosis not present

## 2022-02-16 LAB — RAD ONC ARIA SESSION SUMMARY
Course Elapsed Days: 20
Plan Fractions Treated to Date: 14
Plan Prescribed Dose Per Fraction: 2.66 Gy
Plan Total Fractions Prescribed: 16
Plan Total Prescribed Dose: 42.56 Gy
Reference Point Dosage Given to Date: 37.24 Gy
Reference Point Session Dosage Given: 2.66 Gy
Session Number: 14

## 2022-02-17 ENCOUNTER — Other Ambulatory Visit: Payer: Self-pay

## 2022-02-17 ENCOUNTER — Ambulatory Visit
Admission: RE | Admit: 2022-02-17 | Discharge: 2022-02-17 | Disposition: A | Discharge status: Home / Self Care | Payer: BC Managed Care – PPO | Source: Ambulatory Visit | Attending: Radiation Oncology | Admitting: Radiation Oncology

## 2022-02-17 DIAGNOSIS — Z170 Estrogen receptor positive status [ER+]: Secondary | ICD-10-CM | POA: Diagnosis not present

## 2022-02-17 DIAGNOSIS — C50411 Malignant neoplasm of upper-outer quadrant of right female breast: Secondary | ICD-10-CM | POA: Diagnosis not present

## 2022-02-17 DIAGNOSIS — Z510 Encounter for antineoplastic radiation therapy: Secondary | ICD-10-CM | POA: Diagnosis not present

## 2022-02-17 LAB — RAD ONC ARIA SESSION SUMMARY
Course Elapsed Days: 21
Plan Fractions Treated to Date: 15
Plan Prescribed Dose Per Fraction: 2.66 Gy
Plan Total Fractions Prescribed: 16
Plan Total Prescribed Dose: 42.56 Gy
Reference Point Dosage Given to Date: 39.9 Gy
Reference Point Session Dosage Given: 2.66 Gy
Session Number: 15

## 2022-02-18 ENCOUNTER — Ambulatory Visit
Admission: RE | Admit: 2022-02-18 | Discharge: 2022-02-18 | Disposition: A | Discharge status: Home / Self Care | Payer: BC Managed Care – PPO | Source: Ambulatory Visit | Attending: Radiation Oncology | Admitting: Radiation Oncology

## 2022-02-18 ENCOUNTER — Other Ambulatory Visit: Payer: Self-pay

## 2022-02-18 DIAGNOSIS — Z510 Encounter for antineoplastic radiation therapy: Secondary | ICD-10-CM | POA: Diagnosis not present

## 2022-02-18 DIAGNOSIS — C50411 Malignant neoplasm of upper-outer quadrant of right female breast: Secondary | ICD-10-CM | POA: Diagnosis not present

## 2022-02-18 DIAGNOSIS — Z170 Estrogen receptor positive status [ER+]: Secondary | ICD-10-CM | POA: Diagnosis not present

## 2022-02-18 LAB — RAD ONC ARIA SESSION SUMMARY
Course Elapsed Days: 22
Plan Fractions Treated to Date: 16
Plan Prescribed Dose Per Fraction: 2.66 Gy
Plan Total Fractions Prescribed: 16
Plan Total Prescribed Dose: 42.56 Gy
Reference Point Dosage Given to Date: 42.56 Gy
Reference Point Session Dosage Given: 2.66 Gy
Session Number: 16

## 2022-02-19 ENCOUNTER — Other Ambulatory Visit: Payer: Self-pay

## 2022-02-19 ENCOUNTER — Ambulatory Visit
Admission: RE | Admit: 2022-02-19 | Discharge: 2022-02-19 | Disposition: A | Discharge status: Home / Self Care | Payer: BC Managed Care – PPO | Source: Ambulatory Visit | Attending: Radiation Oncology | Admitting: Radiation Oncology

## 2022-02-19 DIAGNOSIS — C50411 Malignant neoplasm of upper-outer quadrant of right female breast: Secondary | ICD-10-CM | POA: Diagnosis not present

## 2022-02-19 DIAGNOSIS — Z510 Encounter for antineoplastic radiation therapy: Secondary | ICD-10-CM | POA: Diagnosis not present

## 2022-02-19 DIAGNOSIS — Z170 Estrogen receptor positive status [ER+]: Secondary | ICD-10-CM | POA: Diagnosis not present

## 2022-02-19 LAB — RAD ONC ARIA SESSION SUMMARY
Course Elapsed Days: 23
Plan Fractions Treated to Date: 1
Plan Prescribed Dose Per Fraction: 2 Gy
Plan Total Fractions Prescribed: 4
Plan Total Prescribed Dose: 8 Gy
Reference Point Dosage Given to Date: 2 Gy
Reference Point Session Dosage Given: 2 Gy
Session Number: 17

## 2022-02-20 NOTE — Progress Notes (Signed)
Patient Care Team: Serena Croissant, MD as PCP - General (Hematology and Oncology) Donnelly Angelica, RN as Oncology Nurse Navigator Pershing Proud, RN as Oncology Nurse Navigator Abigail Miyamoto, MD as Consulting Physician (General Surgery) Serena Croissant, MD as Consulting Physician (Hematology and Oncology) Dorothy Puffer, MD as Consulting Physician (Radiation Oncology)  DIAGNOSIS:  Encounter Diagnosis  Name Primary?   Malignant neoplasm of upper-outer quadrant of right breast in female, estrogen receptor positive (HCC)     SUMMARY OF ONCOLOGIC HISTORY: Oncology History  Malignant neoplasm of upper-outer quadrant of right breast in female, estrogen receptor positive (HCC)  11/13/2021 Initial Diagnosis   Mammogram detected right breast asymmetry with calcifications, mass detected at ultrasound at 10 o'clock position measuring 0.7 cm, axilla negative, biopsy: Grade 1 IDC with tubular features ER 100%, PR 95%, Ki-67 10%, HER2 negative   12/02/2021 Cancer Staging   Staging form: Breast, AJCC 8th Edition - Clinical: Stage IA (cT1b, cN0, cM0, G1, ER+, PR+, HER2-) - Signed by Serena Croissant, MD on 12/02/2021 Stage prefix: Initial diagnosis Histologic grading system: 3 grade system    Genetic Testing   [facility] CancerNext-Expanded Panel was Positive.  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 This result is consistent with a diagnosis of moderately-increased lifetime colorectal cancer risk, NOT classic familial adenomatous polyposis (FAP) or attenuated FAP (AFAP). Report date is 12/14/2021.  The CancerNext-Expanded gene panel offered by [facility] and includes sequencing, rearrangement, and RNA analysis for the following 77 genes: AIP, ALK, APC, ATM, AXIN2, BAP1, BARD1, BLM, BMPR1A, BRCA1, BRCA2, BRIP1, CDC73, CDH1, CDK4, CDKN1B, CDKN2A, CHEK2, CTNNA1, DICER1, FANCC, FH, FLCN, GALNT12, KIF1B, LZTR1, MAX, MEN1, MET, MLH1, MSH2, MSH3, MSH6,  MUTYH, NBN, NF1, NF2, NTHL1, PALB2, PHOX2B, PMS2, POT1, PRKAR1A, PTCH1, PTEN, RAD51C, RAD51D, RB1, RECQL, RET, SDHA, SDHAF2, SDHB, SDHC, SDHD, SMAD4, SMARCA4, SMARCB1, SMARCE1, STK11, SUFU, TMEM127, TP53, TSC1, TSC2, VHL and XRCC2 (sequencing and deletion/duplication); EGFR, EGLN1, HOXB13, KIT, MITF, PDGFRA, POLD1, and POLE (sequencing only); EPCAM and GREM1 (deletion/duplication only).    12/23/2021 Surgery   BREAST, RIGHT, LUMPECTOMY, Pathologic Stage Classification (pTNM, AJCC 8th Edition): pT1b, pN0   12/23/2021 Surgery   Rt Lumpectomy 0.8 cm, Grade 1, Margins Neg, 0/1 LN Neg, ER 100%, PR 95%, Ki-67 10%, HER2 negative     CHIEF COMPLIANT: Follow-up after XRT   INTERVAL HISTORY: [name] is a 85 y.yo female with the above mention. She presents to the clinic today for a follow-up. She states radiation went well. 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  ALLERGIES:  is allergic to amoxicillin and other.  MEDICATIONS:  Current Outpatient Medications  Medication Sig Dispense Refill   [START ON 03/14/2022] tamoxifen (NOLVADEX) 20 MG tablet Take 1 tablet (20 mg total) by mouth daily. 90 tablet 3   bimatoprost (LATISSE) 0.03 % ophthalmic solution apply to upper lid margins as directed.     cholecalciferol (VITAMIN D3) 25 MCG (1000 UNIT) tablet Take 1,000 Units by mouth daily.     Multiple Vitamin (MULTIVITAMIN WITH MINERALS) TABS tablet Take 1 tablet by mouth daily.     spironolactone (ALDACTONE) 25 MG tablet Take 75 mg by mouth daily.     No current facility-administered medications for this visit.    PHYSICAL EXAMINATION: ECOG PERFORMANCE STATUS: 1 - Symptomatic but completely ambulatory  Vitals:   02/23/22 0944  BP: 102/69  Pulse: 73  Resp: 17  Temp: (!) 97.5 F (36.4 C)  SpO2: 100%  Filed Weights   02/23/22 0944  Weight: 131 lb 1.6 oz (59.5 kg)      LABORATORY DATA:  I have reviewed the data as listed    Latest Ref Rng & Units 12/02/2021    8:02 AM  10/20/2012    9:45 AM 09/04/2012   10:18 AM  CMP  Glucose 70 - 99 mg/dL 84  87  85   BUN 6 - 20 mg/dL 10  13  40.9   Creatinine 0.44 - 1.00 mg/dL 8.11  9.14  0.8   Sodium 135 - 145 mmol/L 137  136  139   Potassium 3.5 - 5.1 mmol/L 4.1  4.3  4.5   Chloride 98 - 111 mmol/L 107  104  107   CO2 22 - 32 mmol/L 25  26  25    Calcium 8.9 - 10.3 mg/dL 9.4  9.9  78.2   Total Protein 6.5 - 8.1 g/dL 6.5   7.1   Total Bilirubin 0.3 - 1.2 mg/dL 0.5   9.56   Alkaline Phos 38 - 126 U/L 53   56   AST 15 - 41 U/L 18   17   ALT 0 - 44 U/L 12   7     Lab Results  Component Value Date   WBC 4.8 12/02/2021   HGB 14.0 12/02/2021   HCT 41.6 12/02/2021   MCV 92.0 12/02/2021   PLT 244 12/02/2021   NEUTROABS 3.0 12/02/2021    ASSESSMENT & PLAN:  Malignant neoplasm of upper-outer quadrant of right breast in female, estrogen receptor positive (HCC) 11/13/2021:Mammogram detected right breast asymmetry with calcifications, mass detected at ultrasound at 10 o'clock position measuring 0.7 cm, axilla negative, biopsy: Grade 1 IDC with tubular features ER 100%, PR 95%, Ki-67 10%, HER2 negative    12/23/21: Rt Lumpectomy 0.8 cm, Grade 1, Margins Neg, 0/1 LN Neg, ER 100%, PR 95%, Ki-67 10%, HER2 negative   Plan: 1. Adjuvant radiation therapy 01/28/2022-02/24/2022 2. Adjuvant antiestrogen therapy   Tamoxifen counseling: We discussed the risks and benefits of tamoxifen. These include but not limited to insomnia, hot flashes, mood changes, vaginal dryness, and weight gain. Although rare, serious side effects including  risk of blood clots were also discussed. We strongly believe that the benefits far outweigh the risks. Patient understands these risks and consented to starting treatment. Planned treatment duration is 5-10 years.  She will start at 10 mg dose again and will increase it to 20 mg if she tolerates it well.  She had a prior hysterectomy so there is no concern for uterine cancer.  FSH and estradiol: Patient  is not in menopause. Return to clinic in 3 months for survivorship care plan visit    No orders of the defined types were placed in this encounter.  The patient has a good understanding of the overall plan. she agrees with it. she will call with any problems that may develop before the next visit here. Total time spent: 30 mins including face to face time and time spent for planning, charting and co-ordination of care   Tamsen Meek, MD 02/23/22    I Janan Ridge am scribing for Dr. Pamelia Hoit  I have reviewed the above documentation for accuracy and completeness, and I agree with the above.

## 2022-02-22 ENCOUNTER — Ambulatory Visit
Admission: RE | Admit: 2022-02-22 | Discharge: 2022-02-22 | Disposition: A | Discharge status: Home / Self Care | Payer: BC Managed Care – PPO | Source: Ambulatory Visit | Attending: Radiation Oncology | Admitting: Radiation Oncology

## 2022-02-22 ENCOUNTER — Other Ambulatory Visit: Payer: Self-pay

## 2022-02-22 DIAGNOSIS — Z510 Encounter for antineoplastic radiation therapy: Secondary | ICD-10-CM | POA: Diagnosis not present

## 2022-02-22 DIAGNOSIS — Z170 Estrogen receptor positive status [ER+]: Secondary | ICD-10-CM | POA: Diagnosis not present

## 2022-02-22 DIAGNOSIS — C50411 Malignant neoplasm of upper-outer quadrant of right female breast: Secondary | ICD-10-CM | POA: Diagnosis not present

## 2022-02-22 LAB — RAD ONC ARIA SESSION SUMMARY
Course Elapsed Days: 26
Plan Fractions Treated to Date: 2
Plan Prescribed Dose Per Fraction: 2 Gy
Plan Total Fractions Prescribed: 4
Plan Total Prescribed Dose: 8 Gy
Reference Point Dosage Given to Date: 4 Gy
Reference Point Session Dosage Given: 2 Gy
Session Number: 18

## 2022-02-23 ENCOUNTER — Inpatient Hospital Stay: Payer: BC Managed Care – PPO | Attending: Hematology and Oncology | Admitting: Hematology and Oncology

## 2022-02-23 ENCOUNTER — Encounter: Payer: Self-pay | Admitting: *Deleted

## 2022-02-23 ENCOUNTER — Other Ambulatory Visit: Payer: Self-pay

## 2022-02-23 ENCOUNTER — Ambulatory Visit
Admission: RE | Admit: 2022-02-23 | Discharge: 2022-02-23 | Disposition: A | Discharge status: Home / Self Care | Payer: BC Managed Care – PPO | Source: Ambulatory Visit | Attending: Radiation Oncology | Admitting: Radiation Oncology

## 2022-02-23 DIAGNOSIS — C50411 Malignant neoplasm of upper-outer quadrant of right female breast: Secondary | ICD-10-CM

## 2022-02-23 DIAGNOSIS — Z170 Estrogen receptor positive status [ER+]: Secondary | ICD-10-CM

## 2022-02-23 DIAGNOSIS — Z510 Encounter for antineoplastic radiation therapy: Secondary | ICD-10-CM | POA: Diagnosis not present

## 2022-02-23 LAB — RAD ONC ARIA SESSION SUMMARY
Course Elapsed Days: 27
Plan Fractions Treated to Date: 3
Plan Prescribed Dose Per Fraction: 2 Gy
Plan Total Fractions Prescribed: 4
Plan Total Prescribed Dose: 8 Gy
Reference Point Dosage Given to Date: 6 Gy
Reference Point Session Dosage Given: 2 Gy
Session Number: 19

## 2022-02-23 MED ORDER — TAMOXIFEN CITRATE 20 MG PO TABS: 20.0000 mg | ORAL_TABLET | Freq: Every day | ORAL | 3 refills | Status: DC

## 2022-02-23 NOTE — Assessment & Plan Note (Addendum)
11/13/2021:Mammogram detected right breast asymmetry with calcifications, mass detected at ultrasound at 10 o'clock position measuring 0.7 cm, axilla negative, biopsy: Grade 1 IDC with tubular features ER 100%, PR 95%, Ki-67 10%, HER2 negative   12/23/21: Rt Lumpectomy 0.8 cm, Grade 1, Margins Neg, 0/1 LN Neg, ER 100%, PR 95%, Ki-67 10%, HER2 negative  Plan: 1. Adjuvant radiation therapy 01/28/2022-02/24/2022 2. Adjuvant antiestrogen therapy  Tamoxifen counseling: We discussed the risks and benefits of tamoxifen. These include but not limited to insomnia, hot flashes, mood changes, vaginal dryness, and weight gain. Although rare, serious side effects including  risk of blood clots were also discussed. We strongly believe that the benefits far outweigh the risks. Patient understands these risks and consented to starting treatment. Planned treatment duration is 5-10 years.  She will start at 10 mg dose again and will increase it to 20 mg if she tolerates it well.  She had a prior hysterectomy so there is no concern for uterine cancer.  FSH and estradiol: Patient is not in menopause. Return to clinic in 3 months for survivorship care plan visit

## 2022-02-24 ENCOUNTER — Encounter: Payer: Self-pay | Admitting: Radiation Oncology

## 2022-02-24 ENCOUNTER — Ambulatory Visit
Admission: RE | Admit: 2022-02-24 | Discharge: 2022-02-24 | Disposition: A | Discharge status: Home / Self Care | Payer: BC Managed Care – PPO | Source: Ambulatory Visit | Attending: Radiation Oncology | Admitting: Radiation Oncology

## 2022-02-24 ENCOUNTER — Other Ambulatory Visit: Payer: Self-pay

## 2022-02-24 DIAGNOSIS — Z170 Estrogen receptor positive status [ER+]: Secondary | ICD-10-CM | POA: Diagnosis not present

## 2022-02-24 DIAGNOSIS — C50411 Malignant neoplasm of upper-outer quadrant of right female breast: Secondary | ICD-10-CM | POA: Diagnosis not present

## 2022-02-24 DIAGNOSIS — Z510 Encounter for antineoplastic radiation therapy: Secondary | ICD-10-CM | POA: Diagnosis not present

## 2022-02-24 LAB — RAD ONC ARIA SESSION SUMMARY
Course Elapsed Days: 28
Plan Fractions Treated to Date: 4
Plan Prescribed Dose Per Fraction: 2 Gy
Plan Total Fractions Prescribed: 4
Plan Total Prescribed Dose: 8 Gy
Reference Point Dosage Given to Date: 8 Gy
Reference Point Session Dosage Given: 2 Gy
Session Number: 20

## 2022-03-02 NOTE — Progress Notes (Signed)
                                                                                                                                                             Patient Name: [name] MRN: [id number] DOB: Mar 25, 1976 Referring Physician: Gaynelle Arabian (Profile Not Attached) Date of Service: 02/24/2022 [facility], [place]                                                        End Of Treatment Note  Diagnoses: C50.411-Malignant neoplasm of upper-outer quadrant of right female breast  Cancer Staging: Stage IA, pT1bN0M0, grade 1, ER/PR positive invasive ductal carcinoma of the right breast  Intent: Curative  Radiation Treatment Dates: 01/27/2022 through 02/24/2022 Site Technique Total Dose (Gy) Dose per Fx (Gy) Completed Fx Beam Energies  Breast, Right: Breast_R 3D 42.56/42.56 2.66 16/16 6XFFF  Breast, Right: Breast_R_Bst 3D 8/8 2 4/4 6X, 10X   Narrative: She developed anticipated skin changes in the treatment field.   Plan: The patient will receive a call in about one month from the radiation oncology department. She will continue follow up with Dr. Lindi Adie as well.   ________________________________________________    Carola Rhine, [facility]

## 2022-03-04 ENCOUNTER — Ambulatory Visit: Payer: BC Managed Care – PPO | Attending: Surgery | Admitting: Physical Therapy

## 2022-03-04 ENCOUNTER — Encounter: Payer: Self-pay | Admitting: Physical Therapy

## 2022-03-04 DIAGNOSIS — R293 Abnormal posture: Secondary | ICD-10-CM | POA: Insufficient documentation

## 2022-03-04 DIAGNOSIS — C50411 Malignant neoplasm of upper-outer quadrant of right female breast: Secondary | ICD-10-CM | POA: Diagnosis not present

## 2022-03-04 DIAGNOSIS — Z170 Estrogen receptor positive status [ER+]: Secondary | ICD-10-CM | POA: Diagnosis not present

## 2022-03-04 DIAGNOSIS — Z483 Aftercare following surgery for neoplasm: Secondary | ICD-10-CM | POA: Insufficient documentation

## 2022-03-04 NOTE — Therapy (Signed)
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 [name] consulting w/Dr. Gertie Fey for this   Leukopenia 09/29/2011   Parotid mass 09/28/2011   S/P excision benign mass R parotid 12/09   Past Surgical History:  Procedure Laterality Date   BREAST BIOPSY Right 11/13/2021   BREAST LUMPECTOMY WITH RADIOACTIVE SEED AND SENTINEL LYMPH NODE BIOPSY Right 12/23/2021   Procedure: RADIOACTIVE SEED GUIDED RIGHT BREAST LUMPECTOMY AND SENTINEL NODE BIOPSY;  Surgeon: Coralie Keens, MD;  Location: [facility];  Service: General;  Laterality: Right;   CESAREAN SECTION  05/31/2006   CYST EXCISION  2020   R axillary   FEMUR SURGERY Right 2009   Fibula bone grafted to R femur   FINGER FRACTURE SURGERY Right 1992   Ring finger   LAPAROSCOPIC ASSISTED VAGINAL HYSTERECTOMY N/A 10/25/2012   Procedure: LAPAROSCOPIC ASSISTED VAGINAL HYSTERECTOMY;  Surgeon: Allena Katz, MD;  Location: [facility];  Service: Gynecology;  Laterality: N/A;  with aspiration of left ovarian cyst   mammopexy Bilateral    MASS EXCISION Left 04/2008   R parotid   TUBAL LIGATION     Feb 2012   Patient Active Problem List   Diagnosis Date Noted   Gene mutation 12/21/2021   Genetic testing  12/11/2021   Family history of colon cancer in mother 12/02/2021   Malignant neoplasm of upper-outer quadrant of right breast in female, estrogen receptor positive ([place]) 11/26/2021   Leukopenia 09/29/2011   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 Coralie Keens  REFERRING DIAG: Right breast cancer  THERAPY DIAG:  Aftercare following surgery for neoplasm - Plan: PT plan of care cert/re-cert  Abnormal posture - Plan: PT plan of care cert/re-cert  Malignant neoplasm of upper-outer quadrant of right breast in female, estrogen receptor positive ([place]) - Plan: PT plan of care cert/re-cert  Rationale for Evaluation and Treatment Rehabilitation  ONSET DATE: 12/23/2021  SUBJECTIVE:  SUBJECTIVE STATEMENT: The cording did not get any worse but it also did not get any better. The skin in front of the scar looks different and feels weird when I move. There are intense sensations.   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 She underwent a right lumpectomy and sentinel node biopsy (1 negative node) on 12/23/2021. It is ER/PR positive and HER2 negative with a Ki67 of 10%.   PATIENT GOALS:  Reassess how my recovery is going related to arm function, pain, and swelling.  PAIN:  Are you having pain? No  PRECAUTIONS: Recent Surgery, right UE Lymphedema risk  ACTIVITY LEVEL / LEISURE: She is biking 4-5x/week for 30 min, light weights   OBJECTIVE:   PATIENT SURVEYS:  QUICK DASH:     OBSERVATIONS:  Right axillary incision is well healed.   POSTURE:  Forward head, rounded  shoulders  LYMPHEDEMA ASSESSMENT:   UPPER EXTREMITY AROM/PROM:   A/PROM RIGHT   eval   RIGHT 01/14/2022 RIGHT  01/18/22 RIGHT 03/04/22  Shoulder extension 55 65    Shoulder flexion 148 136 156 157  Shoulder abduction 155 143 163 152  Shoulder internal rotation 73 68 67   Shoulder external rotation 90 86 84                           (Blank rows = not tested)   A/PROM LEFT   eval  Shoulder extension 62  Shoulder flexion 147  Shoulder abduction 148  Shoulder internal rotation 68  Shoulder external rotation 82                          (Blank rows = not tested)     CERVICAL AROM: All within normal limits   UPPER EXTREMITY STRENGTH: WNL     LYMPHEDEMA ASSESSMENTS:    LANDMARK RIGHT   eval RIGHT 01/14/2022  10 cm proximal to olecranon process 25.2 25.2  Olecranon process 24 23.9  10 cm proximal to ulnar styloid process 20.3 19.5  Just proximal to ulnar styloid process 15.2 15  Across hand at thumb web space 19.2 19  At base of 2nd digit 6 5.7  (Blank rows = not tested)   LANDMARK LEFT   eval LEFT 01/14/2022  10 cm proximal to olecranon process 25.5 25.5  Olecranon process 23.3 23.6  10 cm proximal to ulnar styloid process 19.9 19.4  Just proximal to ulnar styloid process 14.7 14.6  Across hand at thumb web space 18.5 18.1  At base of 2nd digit 5.6 5.5  (Blank rows = not tested)        Surgery type/Date: Right lumpectomy and sentinel node biopsy 12/23/2021 Number of lymph nodes removed: 1 Current/past treatment (chemo, radiation, hormone therapy): none Other symptoms:  Heaviness/tightness No Pain No Pitting edema No Infections No Decreased scar mobility Yes Stemmer sign No  TREATMENT: 03/04/22: Manual Therapy PROM to R shoulder in to flexion and abduction Gentle STM to area surrounding lumpectomy scar and gently along scar avoiding superior aspect where skin is still healing did not do much MFR to cording due to fragile skin post  radiation  01/19/22: Manual Therapy PROM to R shoulder in to flexion and abduction MFR to cording in R axilla extending from scar to medial upper arm with cord becoming less visible and more pliable by end of session.   01/18/22: PROM  to R shoulder in to flexion and abduction. MFR to cording in R axilla extending from scar to medial upper arm with cord becoming less visible and more pliable by end of session.   01/15/22: PROM to R shoulder in to flexion and abduction. MFR to cording in R axilla extending from scar to medial upper arm with cord becoming less visible and more pliable by end of session.   PATIENT EDUCATION:  Education details: HEP and lymphedema risk reduction Person educated: Patient Education method: Explanation, Demonstration, and Handouts Education comprehension: verbalized understanding   HOME EXERCISE PROGRAM:  Reviewed previously given post op HEP. Added closed chain flexion and abduction to regain last 12 degrees of flexion and abduction. Supine dowel fleixon and abduction with 5-30 sec holds x 10 reps  ASSESSMENT:  CLINICAL IMPRESSION: Pt returns to PT after completing radiation last week. She reports her cording is no better but is not worse. Her ROM I still limited and she still has cording in her R axilla. She also has increased scar tissue surrounding her lumpectomy scar with increased discomfort in this area. Pt would benefit from skilled PT services to improve R shoulder ROM, decrease cording and decrease scar tissue. Pt will be on hold 1 week to allow her skin to heal completely then will begin at 2x/wk for 4 weeks.   Pt will benefit from skilled therapeutic intervention to improve on the following deficits: Decreased knowledge of precautions, impaired UE functional use, pain, decreased ROM, postural dysfunction.   PT treatment/interventions: ADL/Self care home management, Therapeutic exercises, Therapeutic activity, Patient/Family education, Self Care, Manual  lymph drainage, scar mobilization, Manual therapy, and Re-evaluation     GOALS: Goals reviewed with patient? Yes  LONG TERM GOALS:  (STG=LTG)  GOALS Name Target Date  Goal status  1 Pt will demonstrate she has regained full shoulder ROM and function post operatively compared to baselines.   02/11/2022 MET 01/18/22- returned to baseline  2 Patient will increase right shoulder flexion to >/= 145 degrees for increased ease reaching overhead. 02/15/2022 MET 01/18/22- 156 degrees of flexion  3 Patient will increase right shoulder abduction to >/= 155 degrees for increased ease obtaining radiation positioning. 02/15/2022 MET 01/18/22- 163 degrees of abduction  4 Patient will demonstrate visible absence of axillary cording as noted in photo. 02/15/2022 IN PROGRESS  6 Pt will report increased comfort and decreased scar tissue in area of lumpectomy scar. 04/01/22 NEW     PLAN: PT FREQUENCY/DURATION: 2x/week for 4 weeks.  PLAN FOR NEXT SESSION: If skin is healed: STM/MFR to scar lumpectomy scar tissue, PROM to R shoulder, MFR to cording   Manus Gunning, PT 03/04/22 2:58 PM    [facility]  [address]  [place] [address]  [phone]

## 2022-03-10 DIAGNOSIS — E7800 Pure hypercholesterolemia, unspecified: Secondary | ICD-10-CM | POA: Diagnosis not present

## 2022-03-10 DIAGNOSIS — Z713 Dietary counseling and surveillance: Secondary | ICD-10-CM | POA: Diagnosis not present

## 2022-03-11 DIAGNOSIS — C4441 Basal cell carcinoma of skin of scalp and neck: Secondary | ICD-10-CM | POA: Diagnosis not present

## 2022-03-11 DIAGNOSIS — L589 Radiodermatitis, unspecified: Secondary | ICD-10-CM | POA: Diagnosis not present

## 2022-03-11 DIAGNOSIS — D485 Neoplasm of uncertain behavior of skin: Secondary | ICD-10-CM | POA: Diagnosis not present

## 2022-03-11 DIAGNOSIS — D225 Melanocytic nevi of trunk: Secondary | ICD-10-CM | POA: Diagnosis not present

## 2022-03-11 DIAGNOSIS — L578 Other skin changes due to chronic exposure to nonionizing radiation: Secondary | ICD-10-CM | POA: Diagnosis not present

## 2022-03-16 ENCOUNTER — Encounter: Payer: Self-pay | Admitting: Physical Therapy

## 2022-03-16 ENCOUNTER — Ambulatory Visit: Payer: BC Managed Care – PPO | Admitting: Physical Therapy

## 2022-03-16 DIAGNOSIS — C50411 Malignant neoplasm of upper-outer quadrant of right female breast: Secondary | ICD-10-CM

## 2022-03-16 DIAGNOSIS — R293 Abnormal posture: Secondary | ICD-10-CM

## 2022-03-16 DIAGNOSIS — Z170 Estrogen receptor positive status [ER+]: Secondary | ICD-10-CM | POA: Diagnosis not present

## 2022-03-16 DIAGNOSIS — Z483 Aftercare following surgery for neoplasm: Secondary | ICD-10-CM | POA: Diagnosis not present

## 2022-03-16 NOTE — Therapy (Signed)
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 [name] consulting w/Dr. Gertie Fey for this   Leukopenia 09/29/2011   Parotid mass 09/28/2011   S/P excision benign mass R parotid 12/09   Past Surgical History:  Procedure Laterality Date   BREAST BIOPSY Right 11/13/2021   BREAST LUMPECTOMY WITH RADIOACTIVE SEED AND SENTINEL LYMPH NODE BIOPSY Right 12/23/2021   Procedure: RADIOACTIVE SEED GUIDED RIGHT BREAST LUMPECTOMY AND SENTINEL NODE BIOPSY;  Surgeon: Coralie Keens, MD;  Location: [facility];  Service: General;  Laterality: Right;   CESAREAN SECTION  05/31/2006   CYST EXCISION  2020   R axillary   FEMUR SURGERY Right 2009   Fibula bone grafted to R femur   FINGER FRACTURE SURGERY Right 1992   Ring finger   LAPAROSCOPIC ASSISTED VAGINAL HYSTERECTOMY N/A 10/25/2012   Procedure: LAPAROSCOPIC ASSISTED VAGINAL HYSTERECTOMY;  Surgeon: Allena Katz, MD;  Location: [facility];  Service: Gynecology;  Laterality: N/A;  with aspiration of left ovarian cyst   mammopexy Bilateral    MASS EXCISION Left 04/2008   R parotid   TUBAL LIGATION     Feb 2012   Patient Active Problem List   Diagnosis Date Noted   Gene mutation 12/21/2021   Genetic testing  12/11/2021   Family history of colon cancer in mother 12/02/2021   Malignant neoplasm of upper-outer quadrant of right breast in female, estrogen receptor positive ([place]) 11/26/2021   Leukopenia 09/29/2011   Chronic idiopathic monocytosis 09/29/2011   Chronic ITP (idiopathic thrombocytopenia) ([place]) 09/28/2011   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 Coralie Keens  REFERRING DIAG: Right breast cancer  THERAPY DIAG:  Aftercare following surgery for neoplasm  Abnormal posture  Malignant neoplasm of upper-outer quadrant of right breast in female, estrogen receptor positive ([place])  Rationale for Evaluation and Treatment Rehabilitation  ONSET DATE: 12/23/2021  SUBJECTIVE:  SUBJECTIVE STATEMENT: The cording is about the same. The burn sensation is getting better.   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 She underwent a right lumpectomy and sentinel node biopsy (1 negative node) on 12/23/2021. It is ER/PR positive and HER2 negative with a Ki67 of 10%.   PATIENT GOALS:  Reassess how my recovery is going related to arm function, pain, and swelling.  PAIN:  Are you having pain? No  PRECAUTIONS: Recent Surgery, right UE Lymphedema risk  ACTIVITY LEVEL / LEISURE: She is biking 4-5x/week for 30 min, light weights   OBJECTIVE:   PATIENT SURVEYS:  QUICK DASH:     OBSERVATIONS:  Right axillary incision is well healed.   POSTURE:  Forward head, rounded shoulders  LYMPHEDEMA ASSESSMENT:   UPPER EXTREMITY AROM/PROM:   A/PROM RIGHT   eval   RIGHT 01/14/2022 RIGHT  01/18/22 RIGHT 03/04/22 RIGHT 03/16/22  Shoulder extension 55 65     Shoulder flexion 148 136 156 157 158   Shoulder abduction 155 143 163 152 152  Shoulder internal rotation 73 68 67    Shoulder external rotation 90 86 84                            (Blank rows = not tested)   A/PROM LEFT   eval  Shoulder extension 62  Shoulder flexion 147  Shoulder abduction 148  Shoulder internal rotation 68  Shoulder external rotation 82                          (Blank rows = not tested)     CERVICAL AROM: All within normal limits   UPPER EXTREMITY STRENGTH: WNL     LYMPHEDEMA ASSESSMENTS:    LANDMARK RIGHT   eval RIGHT 01/14/2022  10 cm proximal to olecranon process 25.2 25.2  Olecranon process 24 23.9  10 cm proximal to ulnar styloid process 20.3 19.5  Just proximal to ulnar styloid process 15.2 15  Across hand at thumb web space 19.2 19  At base of 2nd digit 6 5.7  (Blank rows = not tested)   LANDMARK LEFT   eval LEFT 01/14/2022  10 cm proximal to olecranon process 25.5 25.5  Olecranon process 23.3 23.6  10 cm proximal to ulnar styloid process 19.9 19.4  Just proximal to ulnar styloid process 14.7 14.6  Across hand at thumb web space 18.5 18.1  At base of 2nd digit 5.6 5.5  (Blank rows = not tested)        Surgery type/Date: Right lumpectomy and sentinel node biopsy 12/23/2021 Number of lymph nodes removed: 1 Current/past treatment (chemo, radiation, hormone therapy): none Other symptoms:  Heaviness/tightness No Pain No Pitting edema No Infections No Decreased scar mobility Yes Stemmer sign No  TREATMENT: 03/16/22: Manual Therapy PROM to R shoulder in to flexion and abduction with prolonged holds MFR to cording in R axilla extending from scar to medial upper arm with cord becoming less visible and more pliable by end of session.  STM to lumpectomy scar to reduce tightness  03/04/22: Manual Therapy PROM to R shoulder in to flexion and abduction Gentle STM to area surrounding lumpectomy scar and gently along scar avoiding superior aspect where skin is still healing did  not do much MFR to cording due to fragile skin post radiation  01/19/22: Manual Therapy PROM to R shoulder  in to flexion and abduction MFR to cording in R axilla extending from scar to medial upper arm with cord becoming less visible and more pliable by end of session.   01/18/22: PROM to R shoulder in to flexion and abduction. MFR to cording in R axilla extending from scar to medial upper arm with cord becoming less visible and more pliable by end of session.   01/15/22: PROM to R shoulder in to flexion and abduction. MFR to cording in R axilla extending from scar to medial upper arm with cord becoming less visible and more pliable by end of session.   PATIENT EDUCATION:  Education details: HEP and lymphedema risk reduction Person educated: Patient Education method: Explanation, Demonstration, and Handouts Education comprehension: verbalized understanding   HOME EXERCISE PROGRAM:  Reviewed previously given post op HEP. Added closed chain flexion and abduction to regain last 12 degrees of flexion and abduction. Supine dowel fleixon and abduction with 5-30 sec holds x 10 reps  ASSESSMENT:  CLINICAL IMPRESSION: Spent session focused on manual therapy to decrease cording. Cording was not as tight initially as it was at last session and it improved with manual therapy today. Continued with PROM especially to improve R shoulder abduction since this is still limited. Began STM to lumpectomy scar to improve mobility.   Pt will benefit from skilled therapeutic intervention to improve on the following deficits: Decreased knowledge of precautions, impaired UE functional use, pain, decreased ROM, postural dysfunction.   PT treatment/interventions: ADL/Self care home management, Therapeutic exercises, Therapeutic activity, Patient/Family education, Self Care, Manual lymph drainage, scar mobilization, Manual therapy, and Re-evaluation     GOALS: Goals reviewed with patient? Yes  LONG TERM  GOALS:  (STG=LTG)  GOALS Name Target Date  Goal status  1 Pt will demonstrate she has regained full shoulder ROM and function post operatively compared to baselines.   02/11/2022 MET 01/18/22- returned to baseline  2 Patient will increase right shoulder flexion to >/= 145 degrees for increased ease reaching overhead. 02/15/2022 MET 01/18/22- 156 degrees of flexion  3 Patient will increase right shoulder abduction to >/= 155 degrees for increased ease obtaining radiation positioning. 02/15/2022 MET 01/18/22- 163 degrees of abduction  4 Patient will demonstrate visible absence of axillary cording as noted in photo. 02/15/2022 IN PROGRESS  6 Pt will report increased comfort and decreased scar tissue in area of lumpectomy scar. 04/01/22 NEW     PLAN: PT FREQUENCY/DURATION: 2x/week for 4 weeks.  PLAN FOR NEXT SESSION: STM/MFR to scar lumpectomy scar tissue, PROM to R shoulder, MFR to cording   Manus Gunning, PT 03/16/22 10:02 AM    [facility]  [address]  [place] [address]  [phone]

## 2022-03-19 ENCOUNTER — Ambulatory Visit: Payer: BC Managed Care – PPO | Admitting: Physical Therapy

## 2022-03-19 ENCOUNTER — Encounter: Payer: Self-pay | Admitting: Physical Therapy

## 2022-03-19 DIAGNOSIS — Z170 Estrogen receptor positive status [ER+]: Secondary | ICD-10-CM | POA: Diagnosis not present

## 2022-03-19 DIAGNOSIS — Z483 Aftercare following surgery for neoplasm: Secondary | ICD-10-CM

## 2022-03-19 DIAGNOSIS — C50411 Malignant neoplasm of upper-outer quadrant of right female breast: Secondary | ICD-10-CM | POA: Diagnosis not present

## 2022-03-19 DIAGNOSIS — R293 Abnormal posture: Secondary | ICD-10-CM | POA: Diagnosis not present

## 2022-03-19 NOTE — Therapy (Signed)
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 [name] consulting w/Dr. Gertie Fey for this   Leukopenia 09/29/2011   Parotid mass 09/28/2011   S/P excision benign mass R parotid 12/09   Past Surgical History:  Procedure Laterality Date   BREAST BIOPSY Right 11/13/2021   BREAST LUMPECTOMY WITH RADIOACTIVE SEED AND SENTINEL LYMPH NODE BIOPSY Right 12/23/2021   Procedure: RADIOACTIVE SEED GUIDED RIGHT BREAST LUMPECTOMY AND SENTINEL NODE BIOPSY;  Surgeon: Coralie Keens, MD;  Location: [facility];  Service: General;  Laterality: Right;   CESAREAN SECTION  05/31/2006   CYST EXCISION  2020   R axillary   FEMUR SURGERY Right 2009   Fibula bone grafted to R femur   FINGER FRACTURE SURGERY Right 1992   Ring finger   LAPAROSCOPIC ASSISTED VAGINAL HYSTERECTOMY N/A 10/25/2012   Procedure: LAPAROSCOPIC ASSISTED VAGINAL HYSTERECTOMY;  Surgeon: Allena Katz, MD;  Location: [facility];  Service: Gynecology;  Laterality: N/A;  with aspiration of left ovarian cyst   mammopexy Bilateral    MASS EXCISION Left 04/2008   R parotid   TUBAL LIGATION     Feb 2012   Patient Active Problem List   Diagnosis Date Noted   Gene mutation 12/21/2021   Genetic testing  12/11/2021   Family history of colon cancer in mother 12/02/2021   Malignant neoplasm of upper-outer quadrant of right breast in female, estrogen receptor positive ([place]) 11/26/2021   Leukopenia 09/29/2011   Chronic idiopathic monocytosis 09/29/2011   Chronic ITP (idiopathic thrombocytopenia) ([place]) 09/28/2011   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 Coralie Keens  REFERRING DIAG: Right breast cancer  THERAPY DIAG:  Aftercare following surgery for neoplasm  Abnormal posture  Malignant neoplasm of upper-outer quadrant of right breast in female, estrogen receptor positive ([place])  Rationale for Evaluation and Treatment Rehabilitation  ONSET DATE: 12/23/2021  SUBJECTIVE:  SUBJECTIVE STATEMENT: The cording is getting better.    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 She underwent a right lumpectomy and sentinel node biopsy (1 negative node) on 12/23/2021. It is ER/PR positive and HER2 negative with a Ki67 of 10%.   PATIENT GOALS:  Reassess how my recovery is going related to arm function, pain, and swelling.  PAIN:  Are you having pain? No  PRECAUTIONS: Recent Surgery, right UE Lymphedema risk  ACTIVITY LEVEL / LEISURE: She is biking 4-5x/week for 30 min, light weights   OBJECTIVE:   PATIENT SURVEYS:  QUICK DASH:     OBSERVATIONS:  Right axillary incision is well healed.   POSTURE:  Forward head, rounded shoulders  LYMPHEDEMA ASSESSMENT:   UPPER EXTREMITY AROM/PROM:   A/PROM RIGHT   eval   RIGHT 01/14/2022 RIGHT  01/18/22 RIGHT 03/04/22 RIGHT 03/16/22  Shoulder extension 55 65     Shoulder flexion 148 136 156 157 158  Shoulder abduction 155 143 163 152  152  Shoulder internal rotation 73 68 67    Shoulder external rotation 90 86 84                            (Blank rows = not tested)   A/PROM LEFT   eval  Shoulder extension 62  Shoulder flexion 147  Shoulder abduction 148  Shoulder internal rotation 68  Shoulder external rotation 82                          (Blank rows = not tested)     CERVICAL AROM: All within normal limits   UPPER EXTREMITY STRENGTH: WNL     LYMPHEDEMA ASSESSMENTS:    LANDMARK RIGHT   eval RIGHT 01/14/2022  10 cm proximal to olecranon process 25.2 25.2  Olecranon process 24 23.9  10 cm proximal to ulnar styloid process 20.3 19.5  Just proximal to ulnar styloid process 15.2 15  Across hand at thumb web space 19.2 19  At base of 2nd digit 6 5.7  (Blank rows = not tested)   LANDMARK LEFT   eval LEFT 01/14/2022  10 cm proximal to olecranon process 25.5 25.5  Olecranon process 23.3 23.6  10 cm proximal to ulnar styloid process 19.9 19.4  Just proximal to ulnar styloid process 14.7 14.6  Across hand at thumb web space 18.5 18.1  At base of 2nd digit 5.6 5.5  (Blank rows = not tested)        Surgery type/Date: Right lumpectomy and sentinel node biopsy 12/23/2021 Number of lymph nodes removed: 1 Current/past treatment (chemo, radiation, hormone therapy): none Other symptoms:  Heaviness/tightness No Pain No Pitting edema No Infections No Decreased scar mobility Yes Stemmer sign No  TREATMENT: 03/19/22: Manual Therapy PROM to R shoulder in to flexion and abduction with prolonged holds MFR to cording in R axilla extending from scar to medial upper arm with cord becoming less visible and more pliable by end of session.  STM to lumpectomy scar to reduce tightness  03/16/22: Manual Therapy PROM to R shoulder in to flexion and abduction with prolonged holds MFR to cording in R axilla extending from scar to medial upper arm with cord becoming less visible and more pliable by end of session.  STM  to lumpectomy scar to reduce tightness  03/04/22: Manual Therapy PROM to R shoulder in to  flexion and abduction Gentle STM to area surrounding lumpectomy scar and gently along scar avoiding superior aspect where skin is still healing did not do much MFR to cording due to fragile skin post radiation  01/19/22: Manual Therapy PROM to R shoulder in to flexion and abduction MFR to cording in R axilla extending from scar to medial upper arm with cord becoming less visible and more pliable by end of session.   01/18/22: PROM to R shoulder in to flexion and abduction. MFR to cording in R axilla extending from scar to medial upper arm with cord becoming less visible and more pliable by end of session.   01/15/22: PROM to R shoulder in to flexion and abduction. MFR to cording in R axilla extending from scar to medial upper arm with cord becoming less visible and more pliable by end of session.   PATIENT EDUCATION:  Education details: HEP and lymphedema risk reduction Person educated: Patient Education method: Explanation, Demonstration, and Handouts Education comprehension: verbalized understanding   HOME EXERCISE PROGRAM:  Reviewed previously given post op HEP. Added closed chain flexion and abduction to regain last 12 degrees of flexion and abduction. Supine dowel fleixon and abduction with 5-30 sec holds x 10 reps  ASSESSMENT:  CLINICAL IMPRESSION: Pt demonstrates improved scar mobility today and her scar is much softer. Her cording was pronounced at beginning of session but was much more pliable and less visible by end of session. She has her SOZO screening next week and may be ready for d/c next week.   Pt will benefit from skilled therapeutic intervention to improve on the following deficits: Decreased knowledge of precautions, impaired UE functional use, pain, decreased ROM, postural dysfunction.   PT treatment/interventions: ADL/Self care home management, Therapeutic exercises,  Therapeutic activity, Patient/Family education, Self Care, Manual lymph drainage, scar mobilization, Manual therapy, and Re-evaluation     GOALS: Goals reviewed with patient? Yes  LONG TERM GOALS:  (STG=LTG)  GOALS Name Target Date  Goal status  1 Pt will demonstrate she has regained full shoulder ROM and function post operatively compared to baselines.   02/11/2022 MET 01/18/22- returned to baseline  2 Patient will increase right shoulder flexion to >/= 145 degrees for increased ease reaching overhead. 02/15/2022 MET 01/18/22- 156 degrees of flexion  3 Patient will increase right shoulder abduction to >/= 155 degrees for increased ease obtaining radiation positioning. 02/15/2022 MET 01/18/22- 163 degrees of abduction  4 Patient will demonstrate visible absence of axillary cording as noted in photo. 02/15/2022 IN PROGRESS  6 Pt will report increased comfort and decreased scar tissue in area of lumpectomy scar. 04/01/22 NEW     PLAN: PT FREQUENCY/DURATION: 2x/week for 4 weeks.  PLAN FOR NEXT SESSION: STM/MFR to scar lumpectomy scar tissue, PROM to R shoulder, MFR to cording   Manus Gunning, PT 03/19/22 8:50 AM    [facility]  [address]  [place] [address]  [phone]

## 2022-03-22 ENCOUNTER — Ambulatory Visit: Payer: Self-pay

## 2022-03-23 ENCOUNTER — Encounter: Payer: Self-pay | Admitting: Physical Therapy

## 2022-03-23 ENCOUNTER — Ambulatory Visit: Payer: BC Managed Care – PPO | Admitting: Physical Therapy

## 2022-03-23 DIAGNOSIS — R293 Abnormal posture: Secondary | ICD-10-CM | POA: Diagnosis not present

## 2022-03-23 DIAGNOSIS — Z483 Aftercare following surgery for neoplasm: Secondary | ICD-10-CM | POA: Diagnosis not present

## 2022-03-23 DIAGNOSIS — Z170 Estrogen receptor positive status [ER+]: Secondary | ICD-10-CM | POA: Diagnosis not present

## 2022-03-23 DIAGNOSIS — C50411 Malignant neoplasm of upper-outer quadrant of right female breast: Secondary | ICD-10-CM | POA: Diagnosis not present

## 2022-03-23 NOTE — Therapy (Signed)
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 [name] consulting w/Dr. Gertie Fey for this   Leukopenia 09/29/2011   Parotid mass 09/28/2011   S/P excision benign mass R parotid 12/09   Past Surgical History:  Procedure Laterality Date   BREAST BIOPSY Right 11/13/2021   BREAST LUMPECTOMY WITH RADIOACTIVE SEED AND SENTINEL LYMPH NODE BIOPSY Right 12/23/2021   Procedure: RADIOACTIVE SEED GUIDED RIGHT BREAST LUMPECTOMY AND SENTINEL NODE BIOPSY;  Surgeon: Coralie Keens, MD;  Location: [facility];  Service: General;  Laterality: Right;   CESAREAN SECTION  05/31/2006   CYST EXCISION  2020   R axillary   FEMUR SURGERY Right 2009   Fibula bone grafted to R femur   FINGER FRACTURE SURGERY Right 1992   Ring finger   LAPAROSCOPIC ASSISTED VAGINAL HYSTERECTOMY N/A 10/25/2012   Procedure: LAPAROSCOPIC ASSISTED VAGINAL HYSTERECTOMY;  Surgeon: Allena Katz, MD;  Location: [facility];  Service: Gynecology;  Laterality: N/A;  with aspiration of left ovarian cyst   mammopexy Bilateral    MASS EXCISION Left 04/2008   R parotid   TUBAL LIGATION     Feb 2012   Patient Active Problem List   Diagnosis Date Noted   Gene mutation 12/21/2021   Genetic testing  12/11/2021   Family history of colon cancer in mother 12/02/2021   Malignant neoplasm of upper-outer quadrant of right breast in female, estrogen receptor positive ([place]) 11/26/2021   Leukopenia 09/29/2011   Chronic idiopathic monocytosis 09/29/2011   Chronic ITP (idiopathic thrombocytopenia) ([place]) 09/28/2011   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 Coralie Keens  REFERRING DIAG: Right breast cancer  THERAPY DIAG:  Aftercare following surgery for neoplasm  Abnormal posture  Malignant neoplasm of upper-outer quadrant of right breast in female, estrogen receptor positive ([place])  Rationale for Evaluation and Treatment Rehabilitation  ONSET DATE: 12/23/2021  SUBJECTIVE:  SUBJECTIVE STATEMENT: The cording is still there.   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 She underwent a right lumpectomy and sentinel node biopsy (1 negative node) on 12/23/2021. It is ER/PR positive and HER2 negative with a Ki67 of 10%.   PATIENT GOALS:  Reassess how my recovery is going related to arm function, pain, and swelling.  PAIN:  Are you having pain? No  PRECAUTIONS: Recent Surgery, right UE Lymphedema risk  ACTIVITY LEVEL / LEISURE: She is biking 4-5x/week for 30 min, light weights   OBJECTIVE:   PATIENT SURVEYS:  QUICK DASH:     OBSERVATIONS:  Right axillary incision is well healed.   POSTURE:  Forward head, rounded shoulders  LYMPHEDEMA ASSESSMENT:   UPPER EXTREMITY AROM/PROM:   A/PROM RIGHT   eval   RIGHT 01/14/2022 RIGHT  01/18/22 RIGHT 03/04/22 RIGHT 03/16/22  Shoulder extension 55 65     Shoulder flexion 148 136 156 157 158  Shoulder abduction 155 143 163 152 152   Shoulder internal rotation 73 68 67    Shoulder external rotation 90 86 84                            (Blank rows = not tested)   A/PROM LEFT   eval  Shoulder extension 62  Shoulder flexion 147  Shoulder abduction 148  Shoulder internal rotation 68  Shoulder external rotation 82                          (Blank rows = not tested)     CERVICAL AROM: All within normal limits   UPPER EXTREMITY STRENGTH: WNL     LYMPHEDEMA ASSESSMENTS:    LANDMARK RIGHT   eval RIGHT 01/14/2022  10 cm proximal to olecranon process 25.2 25.2  Olecranon process 24 23.9  10 cm proximal to ulnar styloid process 20.3 19.5  Just proximal to ulnar styloid process 15.2 15  Across hand at thumb web space 19.2 19  At base of 2nd digit 6 5.7  (Blank rows = not tested)   LANDMARK LEFT   eval LEFT 01/14/2022  10 cm proximal to olecranon process 25.5 25.5  Olecranon process 23.3 23.6  10 cm proximal to ulnar styloid process 19.9 19.4  Just proximal to ulnar styloid process 14.7 14.6  Across hand at thumb web space 18.5 18.1  At base of 2nd digit 5.6 5.5  (Blank rows = not tested)        Surgery type/Date: Right lumpectomy and sentinel node biopsy 12/23/2021 Number of lymph nodes removed: 1 Current/past treatment (chemo, radiation, hormone therapy): none Other symptoms:  Heaviness/tightness No Pain No Pitting edema No Infections No Decreased scar mobility Yes Stemmer sign No  TREATMENT: 03/19/22: Manual Therapy MFR to cording in R axilla extending from scar to medial upper arm with cord becoming more pliable by end of session.  STM to lumpectomy scar to reduce tightness and to serratus in area of tightness   03/16/22: Manual Therapy PROM to R shoulder in to flexion and abduction with prolonged holds MFR to cording in R axilla extending from scar to medial upper arm with cord becoming less visible and more pliable by end of session.  STM to lumpectomy scar to reduce  tightness  03/04/22: Manual Therapy PROM to R shoulder in to flexion and abduction Gentle STM to area surrounding  lumpectomy scar and gently along scar avoiding superior aspect where skin is still healing did not do much MFR to cording due to fragile skin post radiation  01/19/22: Manual Therapy PROM to R shoulder in to flexion and abduction MFR to cording in R axilla extending from scar to medial upper arm with cord becoming less visible and more pliable by end of session.   01/18/22: PROM to R shoulder in to flexion and abduction. MFR to cording in R axilla extending from scar to medial upper arm with cord becoming less visible and more pliable by end of session.   01/15/22: PROM to R shoulder in to flexion and abduction. MFR to cording in R axilla extending from scar to medial upper arm with cord becoming less visible and more pliable by end of session.   PATIENT EDUCATION:  Education details: HEP and lymphedema risk reduction Person educated: Patient Education method: Explanation, Demonstration, and Handouts Education comprehension: verbalized understanding   HOME EXERCISE PROGRAM:  Reviewed previously given post op HEP. Added closed chain flexion and abduction to regain last 12 degrees of flexion and abduction. Supine dowel fleixon and abduction with 5-30 sec holds x 10 reps  L DEX:  L dex score on 03/23/22- 1.2 change from baseline ASSESSMENT:  CLINICAL IMPRESSION: Reassess SOZO today and pt was still in the green so there is no evidence of subclinical lymphedema. Continued with manual therapy to decrease cording. Pt reports she has increased pulling when reaching forward or down at work. Performed STM to R serratus to help decrease any tightness. Her cording is still visible but is more pliable by end of session.   Pt will benefit from skilled therapeutic intervention to improve on the following deficits: Decreased knowledge of precautions, impaired UE functional use, pain,  decreased ROM, postural dysfunction.   PT treatment/interventions: ADL/Self care home management, Therapeutic exercises, Therapeutic activity, Patient/Family education, Self Care, Manual lymph drainage, scar mobilization, Manual therapy, and Re-evaluation     GOALS: Goals reviewed with patient? Yes  LONG TERM GOALS:  (STG=LTG)  GOALS Name Target Date  Goal status  1 Pt will demonstrate she has regained full shoulder ROM and function post operatively compared to baselines.   02/11/2022 MET 01/18/22- returned to baseline  2 Patient will increase right shoulder flexion to >/= 145 degrees for increased ease reaching overhead. 02/15/2022 MET 01/18/22- 156 degrees of flexion  3 Patient will increase right shoulder abduction to >/= 155 degrees for increased ease obtaining radiation positioning. 02/15/2022 MET 01/18/22- 163 degrees of abduction  4 Patient will demonstrate visible absence of axillary cording as noted in photo. 02/15/2022 IN PROGRESS  6 Pt will report increased comfort and decreased scar tissue in area of lumpectomy scar. 04/01/22 NEW     PLAN: PT FREQUENCY/DURATION: 2x/week for 4 weeks.  PLAN FOR NEXT SESSION: STM/MFR to scar lumpectomy scar tissue, PROM to R shoulder, MFR to cording   Manus Gunning, PT 03/23/22 9:07 AM    [facility]  [address]  [place] [address]  [phone]

## 2022-03-26 ENCOUNTER — Encounter: Payer: Self-pay | Admitting: Physical Therapy

## 2022-03-26 ENCOUNTER — Ambulatory Visit: Payer: BC Managed Care – PPO | Admitting: Physical Therapy

## 2022-03-26 DIAGNOSIS — R293 Abnormal posture: Secondary | ICD-10-CM

## 2022-03-26 DIAGNOSIS — Z483 Aftercare following surgery for neoplasm: Secondary | ICD-10-CM

## 2022-03-26 DIAGNOSIS — Z170 Estrogen receptor positive status [ER+]: Secondary | ICD-10-CM | POA: Diagnosis not present

## 2022-03-26 DIAGNOSIS — C50411 Malignant neoplasm of upper-outer quadrant of right female breast: Secondary | ICD-10-CM | POA: Diagnosis not present

## 2022-03-26 NOTE — Therapy (Signed)
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 [name] consulting w/Dr. Gertie Fey for this   Leukopenia 09/29/2011   Parotid mass 09/28/2011   S/P excision benign mass R parotid 12/09   Past Surgical History:  Procedure Laterality Date   BREAST BIOPSY Right 11/13/2021   BREAST LUMPECTOMY WITH RADIOACTIVE SEED AND SENTINEL LYMPH NODE BIOPSY Right 12/23/2021   Procedure: RADIOACTIVE SEED GUIDED RIGHT BREAST LUMPECTOMY AND SENTINEL NODE BIOPSY;  Surgeon: Coralie Keens, MD;  Location: [facility];  Service: General;  Laterality: Right;   CESAREAN SECTION  05/31/2006   CYST EXCISION  2020   R axillary   FEMUR SURGERY Right 2009   Fibula bone grafted to R femur   FINGER FRACTURE SURGERY Right 1992   Ring finger   LAPAROSCOPIC ASSISTED VAGINAL HYSTERECTOMY N/A 10/25/2012   Procedure: LAPAROSCOPIC ASSISTED VAGINAL HYSTERECTOMY;  Surgeon: Allena Katz, MD;  Location: [facility];  Service: Gynecology;  Laterality: N/A;  with aspiration of left ovarian cyst   mammopexy Bilateral    MASS EXCISION Left 04/2008   R parotid   TUBAL LIGATION     Feb 2012   Patient Active Problem List   Diagnosis Date Noted   Gene mutation 12/21/2021    Genetic testing 12/11/2021   Family history of colon cancer in mother 12/02/2021   Malignant neoplasm of upper-outer quadrant of right breast in female, estrogen receptor positive ([place]) 11/26/2021   Leukopenia 09/29/2011   Chronic idiopathic monocytosis 09/29/2011   Chronic ITP (idiopathic thrombocytopenia) ([place]) 09/28/2011   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 Coralie Keens  REFERRING DIAG: Right breast cancer  THERAPY DIAG:  Aftercare following surgery for neoplasm  Abnormal posture  Malignant neoplasm of upper-outer quadrant of right breast in female, estrogen receptor positive ([place])  Rationale for Evaluation and Treatment Rehabilitation  ONSET DATE: 12/23/2021  SUBJECTIVE:  SUBJECTIVE STATEMENT: I would like to make today my last day if I could.   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 She underwent a right lumpectomy and sentinel node biopsy (1 negative node) on 12/23/2021. It is ER/PR positive and HER2 negative with a Ki67 of 10%.   PATIENT GOALS:  Reassess how my recovery is going related to arm function, pain, and swelling.  PAIN:  Are you having pain? No  PRECAUTIONS: Recent Surgery, right UE Lymphedema risk  ACTIVITY LEVEL / LEISURE: She is biking 4-5x/week for 30 min, light weights   OBJECTIVE:   PATIENT SURVEYS:  QUICK DASH:     OBSERVATIONS:  Right axillary incision is well healed.   POSTURE:  Forward head, rounded shoulders  LYMPHEDEMA ASSESSMENT:   UPPER EXTREMITY AROM/PROM:   A/PROM RIGHT   eval   RIGHT 01/14/2022 RIGHT  01/18/22 RIGHT 03/04/22 RIGHT 03/16/22  Shoulder extension 55 65     Shoulder flexion 148 136 156 157 158   Shoulder abduction 155 143 163 152 152  Shoulder internal rotation 73 68 67    Shoulder external rotation 90 86 84                            (Blank rows = not tested)   A/PROM LEFT   eval  Shoulder extension 62  Shoulder flexion 147  Shoulder abduction 148  Shoulder internal rotation 68  Shoulder external rotation 82                          (Blank rows = not tested)     CERVICAL AROM: All within normal limits   UPPER EXTREMITY STRENGTH: WNL     LYMPHEDEMA ASSESSMENTS:    LANDMARK RIGHT   eval RIGHT 01/14/2022  10 cm proximal to olecranon process 25.2 25.2  Olecranon process 24 23.9  10 cm proximal to ulnar styloid process 20.3 19.5  Just proximal to ulnar styloid process 15.2 15  Across hand at thumb web space 19.2 19  At base of 2nd digit 6 5.7  (Blank rows = not tested)   LANDMARK LEFT   eval LEFT 01/14/2022  10 cm proximal to olecranon process 25.5 25.5  Olecranon process 23.3 23.6  10 cm proximal to ulnar styloid process 19.9 19.4  Just proximal to ulnar styloid process 14.7 14.6  Across hand at thumb web space 18.5 18.1  At base of 2nd digit 5.6 5.5  (Blank rows = not tested)        Surgery type/Date: Right lumpectomy and sentinel node biopsy 12/23/2021 Number of lymph nodes removed: 1 Current/past treatment (chemo, radiation, hormone therapy): none Other symptoms:  Heaviness/tightness No Pain No Pitting edema No Infections No Decreased scar mobility Yes Stemmer sign No  TREATMENT: 03/26/22: Manual Therapy MFR to cording in R axilla extending from scar to medial upper arm with cord becoming more pliable by end of session.  STM to lumpectomy scar with no fibrosis palpable  03/19/22: Manual Therapy MFR to cording in R axilla extending from scar to medial upper arm with cord becoming more pliable by end of session.  STM to lumpectomy scar to reduce tightness and to serratus in area of tightness   03/16/22: Manual Therapy PROM to R shoulder in  to flexion and abduction with prolonged holds MFR to cording in R axilla extending from scar  to medial upper arm with cord becoming less visible and more pliable by end of session.  STM to lumpectomy scar to reduce tightness  03/04/22: Manual Therapy PROM to R shoulder in to flexion and abduction Gentle STM to area surrounding lumpectomy scar and gently along scar avoiding superior aspect where skin is still healing did not do much MFR to cording due to fragile skin post radiation  01/19/22: Manual Therapy PROM to R shoulder in to flexion and abduction MFR to cording in R axilla extending from scar to medial upper arm with cord becoming less visible and more pliable by end of session.   01/18/22: PROM to R shoulder in to flexion and abduction. MFR to cording in R axilla extending from scar to medial upper arm with cord becoming less visible and more pliable by end of session.   01/15/22: PROM to R shoulder in to flexion and abduction. MFR to cording in R axilla extending from scar to medial upper arm with cord becoming less visible and more pliable by end of session.   PATIENT EDUCATION:  Education details: HEP and lymphedema risk reduction Person educated: Patient Education method: Explanation, Demonstration, and Handouts Education comprehension: verbalized understanding   HOME EXERCISE PROGRAM:  Reviewed previously given post op HEP. Added closed chain flexion and abduction to regain last 12 degrees of flexion and abduction. Supine dowel fleixon and abduction with 5-30 sec holds x 10 reps  L DEX:  L dex score on 03/23/22- 1.2 change from baseline ASSESSMENT:  CLINICAL IMPRESSION: Pt has met all goals for therapy except the visibility of her cording. Her cording is still visible though it has improved since the beginning of therapy. She is no longer having discomfort at her lumpectomy scar and she has full shoulder ROM. She will be discharged from skilled PT services at this time.    Pt will benefit from skilled therapeutic intervention to improve on the following deficits: Decreased knowledge of precautions, impaired UE functional use, pain, decreased ROM, postural dysfunction.   PT treatment/interventions: ADL/Self care home management, Therapeutic exercises, Therapeutic activity, Patient/Family education, Self Care, Manual lymph drainage, scar mobilization, Manual therapy, and Re-evaluation     GOALS: Goals reviewed with patient? Yes  LONG TERM GOALS:  (STG=LTG)  GOALS Name Target Date  Goal status  1 Pt will demonstrate she has regained full shoulder ROM and function post operatively compared to baselines.   02/11/2022 MET 01/18/22- returned to baseline  2 Patient will increase right shoulder flexion to >/= 145 degrees for increased ease reaching overhead. 02/15/2022 MET 01/18/22- 156 degrees of flexion  3 Patient will increase right shoulder abduction to >/= 155 degrees for increased ease obtaining radiation positioning. 02/15/2022 MET 01/18/22- 163 degrees of abduction  4 Patient will demonstrate visible absence of axillary cording as noted in photo. 02/15/2022 PARTIALLY MET 03/26/22- cording still visible much less visible than evaluation   6 Pt will report increased comfort and decreased scar tissue in area of lumpectomy scar. 04/01/22 MET 03/26/22     PLAN: PT FREQUENCY/DURATION: 2x/week for 4 weeks.  PLAN FOR NEXT SESSION: d/c this session and continue ldex screenings every 3 months for the first 2 years post op   Allyson Sabal [place], PT 03/26/22 9:04 AM    [facility]  [address], [address]  [place] [address]  [phone]  PHYSICAL THERAPY DISCHARGE SUMMARY  Visits from Start of Care: 10  Current functional level related to goals / functional outcomes: All goals  met, 1 partially met   Remaining deficits: Cording still visible but improved and not limiting ROM   Education / Equipment: HEP   Patient agrees to  discharge. Patient goals were met. Patient is being discharged due to meeting the stated rehab goals.  Allyson Sabal [place], [place] 03/26/22 9:16 AM

## 2022-03-29 ENCOUNTER — Ambulatory Visit: Payer: BC Managed Care – PPO | Admitting: Physical Therapy

## 2022-03-31 ENCOUNTER — Ambulatory Visit: Payer: BC Managed Care – PPO | Admitting: Physical Therapy

## 2022-04-01 DIAGNOSIS — C4441 Basal cell carcinoma of skin of scalp and neck: Secondary | ICD-10-CM | POA: Diagnosis not present

## 2022-04-05 ENCOUNTER — Ambulatory Visit
Admission: RE | Admit: 2022-04-05 | Discharge: 2022-04-05 | Disposition: A | Discharge status: Home / Self Care | Payer: BC Managed Care – PPO | Source: Ambulatory Visit | Attending: Radiation Oncology | Admitting: Radiation Oncology

## 2022-04-05 NOTE — Progress Notes (Signed)
  Radiation Oncology         [phone] ________________________________  Name: [name] MRN: [id number]  Date of Service: 04/05/2022  DOB: 1976-04-13  Post Treatment Telephone Note  Diagnosis:  Stage IA, [id number], grade 1, ER/PR positive invasive ductal carcinoma of the right breast  Intent: Curative  Radiation Treatment Dates: 01/27/2022 through 02/24/2022 Site Technique Total Dose (Gy) Dose per Fx (Gy) Completed Fx Beam Energies  Breast, Right: Breast_R 3D 42.56/42.56 2.66 16/16 6XFFF  Breast, Right: Breast_R_Bst 3D 8/8 2 4/4 6X, 10X  (as documented in provider EOT note).   The patient was available for call today.   Symptoms of fatigue have improved since completing therapy.  Symptoms of skin changes have  improved since completing therapy.  The patient was encouraged to avoid sun exposure in the area of prior treatment for up to one year following radiation with either sunscreen or by the style of clothing worn in the sun.  The patient has scheduled follow up with her medical oncologist Dr. Lindi Adie for ongoing surveillance, and was encouraged to call if she develops concerns or questions regarding radiation.  This concludes the nursing interview.  Leandra Kern, LPN

## 2022-04-08 ENCOUNTER — Encounter: Payer: Self-pay | Admitting: Hematology and Oncology

## 2022-04-13 DIAGNOSIS — Z0000 Encounter for general adult medical examination without abnormal findings: Secondary | ICD-10-CM | POA: Diagnosis not present

## 2022-04-13 DIAGNOSIS — Z862 Personal history of diseases of the blood and blood-forming organs and certain disorders involving the immune mechanism: Secondary | ICD-10-CM | POA: Diagnosis not present

## 2022-04-13 DIAGNOSIS — Z79899 Other long term (current) drug therapy: Secondary | ICD-10-CM | POA: Diagnosis not present

## 2022-04-13 DIAGNOSIS — E7800 Pure hypercholesterolemia, unspecified: Secondary | ICD-10-CM | POA: Diagnosis not present

## 2022-05-17 NOTE — Progress Notes (Unsigned)
SURVIVORSHIP VISIT:   BRIEF ONCOLOGIC HISTORY:  Oncology History  Malignant neoplasm of upper-outer quadrant of right breast in female, estrogen receptor positive ([place])  11/13/2021 Initial Diagnosis   Mammogram detected right breast asymmetry with calcifications, mass detected at ultrasound at 10 o'clock position measuring 0.7 cm, axilla negative, biopsy: Grade 1 IDC with tubular features ER 100%, PR 95%, Ki-67 10%, HER2 negative   12/02/2021 Cancer Staging   Staging form: Breast, AJCC 8th Edition - Clinical: Stage IA (cT1b, cN0, cM0, G1, ER+, PR+, HER2-) - Signed by Nicholas Lose, MD on 12/02/2021 Stage prefix: Initial diagnosis Histologic grading system: 3 grade system    Genetic Testing   [facility] CancerNext-Expanded Panel was Positive.  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 This result is consistent with a diagnosis of moderately-increased lifetime colorectal cancer risk, NOT classic familial adenomatous polyposis (FAP) or attenuated FAP (AFAP). Report date is 12/14/2021.  The CancerNext-Expanded gene panel offered by [facility] and includes sequencing, rearrangement, and RNA analysis for the following 77 genes: AIP, ALK, APC, ATM, AXIN2, BAP1, BARD1, BLM, BMPR1A, BRCA1, BRCA2, BRIP1, CDC73, CDH1, CDK4, CDKN1B, CDKN2A, CHEK2, CTNNA1, DICER1, FANCC, FH, FLCN, GALNT12, KIF1B, LZTR1, MAX, MEN1, MET, MLH1, MSH2, MSH3, MSH6, MUTYH, NBN, NF1, NF2, NTHL1, PALB2, PHOX2B, PMS2, POT1, PRKAR1A, PTCH1, PTEN, RAD51C, RAD51D, RB1, RECQL, RET, SDHA, SDHAF2, SDHB, SDHC, SDHD, SMAD4, SMARCA4, SMARCB1, SMARCE1, STK11, SUFU, TMEM127, TP53, TSC1, TSC2, VHL and XRCC2 (sequencing and deletion/duplication); EGFR, EGLN1, HOXB13, KIT, MITF, PDGFRA, POLD1, and POLE (sequencing only); EPCAM and GREM1 (deletion/duplication only).    12/23/2021 Surgery   BREAST, RIGHT, LUMPECTOMY, Pathologic Stage Classification (pTNM, AJCC 8th Edition): pT1b, pN0   12/23/2021  Surgery   Rt Lumpectomy 0.8 cm, Grade 1, Margins Neg, 0/1 LN Neg, ER 100%, PR 95%, Ki-67 10%, HER2 negative   01/27/2022 - 02/24/2022 Radiation Therapy   Site Technique Total Dose (Gy) Dose per Fx (Gy) Completed Fx Beam Energies  Breast, Right: Breast_R 3D 42.56/42.56 2.66 16/16 6XFFF  Breast, Right: Breast_R_Bst 3D 8/8 2 4/4 6X, 10X     02/2022 -  Anti-estrogen oral therapy   20 mg Tamoxifen x 5-10 years     INTERVAL HISTORY:  [name] to review her survivorship care plan detailing her treatment course for breast cancer, as well as monitoring long-term side effects of that treatment, education regarding health maintenance, screening, and overall wellness and health promotion.     Overall, [name] reports feeling quite well.  She is doing well on Tamoxifen, she has 1-2 hot flashes a night but otherwise is tolerating it well.    REVIEW OF SYSTEMS:  Review of Systems  Constitutional:  Negative for appetite change, chills, fatigue, fever and unexpected weight change.  HENT:   Negative for hearing loss, lump/mass and trouble swallowing.   Eyes:  Negative for eye problems and icterus.  Respiratory:  Negative for chest tightness, cough and shortness of breath.   Cardiovascular:  Negative for chest pain, leg swelling and palpitations.  Gastrointestinal:  Negative for abdominal distention, abdominal pain, constipation, diarrhea, nausea and vomiting.  Endocrine: Positive for hot flashes (at night, per interval history).  Genitourinary:  Negative for difficulty urinating.   Musculoskeletal:  Negative for arthralgias.  Skin:  Negative for itching and rash.  Neurological:  Negative for dizziness, extremity weakness, headaches and numbness.  Hematological:  Negative for adenopathy. Does not bruise/bleed easily.  Psychiatric/Behavioral:  Negative for depression. The patient is not nervous/anxious.  Breast: Denies any new nodularity, masses, tenderness, nipple changes, or nipple discharge.         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 [name] consulting w/Dr. Gertie Fey for this   Leukopenia 09/29/2011   Parotid mass 09/28/2011   S/P excision benign mass R parotid 12/09   Past Surgical History:  Procedure Laterality Date   BREAST BIOPSY Right 11/13/2021   BREAST LUMPECTOMY WITH RADIOACTIVE SEED AND SENTINEL LYMPH NODE BIOPSY Right 12/23/2021   Procedure: RADIOACTIVE SEED GUIDED RIGHT BREAST LUMPECTOMY AND SENTINEL NODE BIOPSY;  Surgeon: Coralie Keens, MD;  Location: [place] [facility] [place];  Service: General;  Laterality: Right;   CESAREAN SECTION  05/31/2006   CYST EXCISION  2020   R axillary   FEMUR SURGERY Right 2009   Fibula bone grafted to R femur   FINGER FRACTURE SURGERY Right 1992   Ring finger   LAPAROSCOPIC ASSISTED VAGINAL HYSTERECTOMY N/A 10/25/2012   Procedure: LAPAROSCOPIC ASSISTED VAGINAL HYSTERECTOMY;  Surgeon: Allena Katz, MD;  Location: [facility];  Service: Gynecology;  Laterality: N/A;  with aspiration of left ovarian cyst   mammopexy Bilateral    MASS EXCISION Left 04/2008   R parotid   TUBAL LIGATION     Feb 2012     ALLERGIES:  Allergies  Allergen Reactions   Amoxicillin Hives, Itching and Rash   Other Itching and Rash    Topical antibiotics- ALL     CURRENT MEDICATIONS:  Outpatient Encounter Medications as of 05/18/2022  Medication Sig   bimatoprost (LATISSE) 0.03 % ophthalmic solution apply to upper lid margins as directed.   cholecalciferol (VITAMIN D3) 25 MCG (1000 UNIT) tablet Take 1,000 Units by mouth daily.   Multiple Vitamin (MULTIVITAMIN WITH MINERALS) TABS tablet Take 1 tablet by mouth daily.   spironolactone (ALDACTONE) 25 MG tablet Take 75 mg  by mouth daily.   tamoxifen (NOLVADEX) 20 MG tablet Take 1 tablet (20 mg total) by mouth daily.   No facility-administered encounter medications on file as of 05/18/2022.     ONCOLOGIC FAMILY HISTORY:  Family History  Problem Relation Age of Onset   Colon cancer Mother 98     SOCIAL HISTORY:  Social History   Socioeconomic History   Marital status: Married    Spouse name: [facility]   Number of children: 3   Years of education: Not on file   Highest education level: Not on file  Occupational History   Not on file  Tobacco Use   Smoking status: Never   Smokeless tobacco: Never  Vaping Use   Vaping Use: Never used  Substance and Sexual Activity   Alcohol use: Yes    Comment: occasionally   Drug use: No   Sexual activity: Yes    Comment: s/p tubal ligation  Other Topics Concern   Not on file  Social History Narrative   Not on file   Social Determinants of Health   Financial Resource Strain: Low Risk  (12/02/2021)   Overall Financial Resource Strain (CARDIA)    Difficulty of Paying Living Expenses: Not hard at all  Food Insecurity: No Food Insecurity (12/02/2021)   Hunger Vital Sign    Worried About Running Out of Food in the  Last Year: Never true    [place] in the Last Year: Never true  Transportation Needs: No Transportation Needs (12/02/2021)   PRAPARE - Hydrologist (Medical): No    Lack of Transportation (Non-Medical): No  Physical Activity: Not on file  Stress: Not on file  Social Connections: Not on file  Intimate Partner Violence: Not on file     OBSERVATIONS/OBJECTIVE:  BP 106/67 (BP Location: Left Arm, Patient Position: Sitting)   Pulse 69   Temp 98.6 F (37 C) (Oral)   Resp 16   Ht _0  (1.651 m)   Wt 126 lb 12.8 oz (57.5 kg)   LMP 10/16/2012   SpO2 100%   BMI 21.10 kg/m  GENERAL: Patient is a well appearing female in no acute distress HEENT:  Sclerae anicteric.  Oropharynx clear and moist. No ulcerations or  evidence of oropharyngeal candidiasis. Neck is supple.  NODES:  No cervical, supraclavicular, or axillary lymphadenopathy palpated.  BREAST EXAM:  Right breast s/p lumpectomy and radiation, no sign of local recurrence, left breast benign LUNGS:  Clear to auscultation bilaterally.  No wheezes or rhonchi. HEART:  Regular rate and rhythm. No murmur appreciated. ABDOMEN:  Soft, nontender.  Positive, normoactive bowel sounds. No organomegaly palpated. MSK:  No focal spinal tenderness to palpation. Full range of motion bilaterally in the upper extremities. EXTREMITIES:  No peripheral edema.   SKIN:  Clear with no obvious rashes or skin changes. No nail dyscrasia. NEURO:  Nonfocal. Well oriented.  Appropriate affect.   LABORATORY DATA:  None for this visit.  DIAGNOSTIC IMAGING:  None for this visit.     ASSESSMENT AND PLAN:  [name] is a pleasant 46 y.o. female with Stage IA right breast invasive ductal carcinoma, ER+/PR+/HER2-, diagnosed in 10/2021, treated with lumpectomy, adjuvant radiation therapy, and anti-estrogen therapy with Tamoxifen beginning in 02/2022.  She presents to the Survivorship Clinic for our initial meeting and routine follow-up post-completion of treatment for breast cancer.    1. Stage IA right/left breast cancer:  [name] is continuing to recover from definitive treatment for breast cancer. She will follow-up with her medical oncologist, Dr. Lindi Adie in 6 months with history and physical exam per surveillance protocol.  She will continue her anti-estrogen therapy with Tamoxifen. Thus far, she is tolerating the Tamoxifen well, with minimal side effects. She was instructed to make Dr. Lindi Adie or myself aware if she begins to experience any worsening side effects of the medication and I could see her back in clinic to help manage those side effects, as needed. Her mammogram is due 10/2022; orders placed today. Today, a comprehensive survivorship care plan and treatment  summary was reviewed with the patient today detailing her breast cancer diagnosis, treatment course, potential late/long-term effects of treatment, appropriate follow-up care with recommendations for the future, and patient education resources.  A copy of this summary, along with a letter will be sent to the patient's primary care provider via mail/fax/In Basket message after today's visit.    2. Bone health:   She was given education on specific activities to promote bone health.  3. Cancer screening:  Due to [name]'s history and her age, she should receive screening for skin cancers, colon cancer (more frequently due to [facility] mutation), and gynecologic cancers.  The information and recommendations are listed on the patient's comprehensive care plan/treatment summary and were reviewed in detail with the patient.    4. Health maintenance and wellness  promotion: [name] was encouraged to consume 5-7 servings of fruits and vegetables per day. We reviewed the "Nutrition Rainbow" handout.  She was also encouraged to engage in moderate to vigorous exercise for 30 minutes per day most days of the week. We discussed the [employer] fitness program, which is designed for cancer survivors to help them become more physically fit after cancer treatments.  She was instructed to limit her alcohol consumption and continue to abstain from tobacco use.     5. Support services/counseling: It is not uncommon for this period of the patient's cancer care trajectory to be one of many emotions and stressors.  She was given information regarding our available services and encouraged to contact me with any questions or for help enrolling in any of our support group/programs.    Follow up instructions:    -Return to cancer center in 6 months for f/u with Dr. Lindi Adie  -Mammogram due in 10/2022 -She is welcome to return back to the Survivorship Clinic at any time; no additional follow-up needed at this time.   -Consider referral back to survivorship as a long-term survivor for continued surveillance  The patient was provided an opportunity to ask questions and all were answered. The patient agreed with the plan and demonstrated an understanding of the instructions.   Total encounter time:30 minutes*in face-to-face visit time, chart review, lab review, care coordination, order entry, and documentation of the encounter time.    Wilber Bihari, NP 05/18/22 8:57 AM Medical Oncology and Hematology [facility] [place], [place] [address] Tel. [phone]    Fax. [phone]  *Total Encounter Time as defined by the Centers for Medicare and Medicaid Services includes, in addition to the face-to-face time of a patient visit (documented in the note above) non-face-to-face time: obtaining and reviewing outside history, ordering and reviewing medications, tests or procedures, care coordination (communications with other health care professionals or caregivers) and documentation in the medical record.

## 2022-05-18 ENCOUNTER — Encounter: Payer: Self-pay | Admitting: Adult Health

## 2022-05-18 ENCOUNTER — Inpatient Hospital Stay: Payer: BC Managed Care – PPO | Attending: Hematology and Oncology | Admitting: Adult Health

## 2022-05-18 VITALS — BP 106/67 | HR 69 | Temp 98.6°F | Resp 16 | Ht 65.0 in | Wt 126.8 lb

## 2022-05-18 DIAGNOSIS — Z800 Family history of malignant neoplasm of digestive organs: Secondary | ICD-10-CM | POA: Insufficient documentation

## 2022-05-18 DIAGNOSIS — C4441 Basal cell carcinoma of skin of scalp and neck: Secondary | ICD-10-CM | POA: Insufficient documentation

## 2022-05-18 DIAGNOSIS — Z170 Estrogen receptor positive status [ER+]: Secondary | ICD-10-CM | POA: Diagnosis not present

## 2022-05-18 DIAGNOSIS — Z79810 Long term (current) use of selective estrogen receptor modulators (SERMs): Secondary | ICD-10-CM | POA: Diagnosis not present

## 2022-05-18 DIAGNOSIS — Z79899 Other long term (current) drug therapy: Secondary | ICD-10-CM | POA: Diagnosis not present

## 2022-05-18 DIAGNOSIS — Z923 Personal history of irradiation: Secondary | ICD-10-CM | POA: Insufficient documentation

## 2022-05-18 DIAGNOSIS — C50411 Malignant neoplasm of upper-outer quadrant of right female breast: Secondary | ICD-10-CM | POA: Insufficient documentation

## 2022-06-21 ENCOUNTER — Ambulatory Visit: Payer: BC Managed Care – PPO | Attending: Surgery

## 2022-06-21 VITALS — Wt 130.4 lb

## 2022-06-21 DIAGNOSIS — Z483 Aftercare following surgery for neoplasm: Secondary | ICD-10-CM

## 2022-06-21 NOTE — Therapy (Signed)
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 [name] consulting w/Dr. Gertie Fey for this   Leukopenia 09/29/2011   Parotid mass 09/28/2011   S/P excision benign mass R parotid 12/09   Past Surgical History:  Procedure Laterality Date   BREAST BIOPSY Right 11/13/2021   BREAST LUMPECTOMY WITH RADIOACTIVE SEED AND SENTINEL LYMPH NODE BIOPSY Right 12/23/2021   Procedure: RADIOACTIVE SEED GUIDED RIGHT BREAST LUMPECTOMY AND SENTINEL NODE BIOPSY;  Surgeon: Coralie Keens, MD;  Location: [place];  Service: General;  Laterality: Right;   CESAREAN SECTION  05/31/2006   CYST EXCISION  2020   R axillary   FEMUR SURGERY Right 2009   Fibula bone grafted to R femur   FINGER FRACTURE SURGERY Right 1992   Ring finger   LAPAROSCOPIC ASSISTED VAGINAL HYSTERECTOMY N/A 10/25/2012   Procedure: LAPAROSCOPIC ASSISTED VAGINAL HYSTERECTOMY;  Surgeon: Allena Katz, MD;  Location: [facility];  Service: Gynecology;  Laterality: N/A;  with aspiration of left ovarian cyst   mammopexy Bilateral    MASS EXCISION Left 04/2008   R parotid   TUBAL LIGATION     Feb 2012   Patient Active Problem List   Diagnosis Date Noted   Basal cell carcinoma  of scalp 05/18/2022   Gene mutation 12/21/2021   Genetic testing 12/11/2021   Family history of colon cancer in mother 12/02/2021   Malignant neoplasm of upper-outer quadrant of right breast in female, estrogen receptor positive ([place]) 11/26/2021   History of laparoscopic-assisted vaginal hysterectomy 10/22/2019   Leukopenia 09/29/2011   Chronic idiopathic monocytosis 09/29/2011   Chronic ITP (idiopathic thrombocytopenia) ([place]) 09/28/2011   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 She underwent a right lumpectomy and sentinel node biopsy (1 negative node) on 12/23/2021. It is ER/PR positive and HER2 negative with a Ki67 of 10%.   PRECAUTIONS: right UE Lymphedema risk, None  SUBJECTIVE: Pt returns for her 3 month L-Dex screen.  PAIN:  Are you having pain? No  SOZO SCREENING: Patient was assessed today using the SOZO machine to determine the lymphedema index score. This was compared to her baseline score. It was determined that she is within the recommended range when compared to her baseline and no further action is needed at this time. She will continue SOZO screenings. These are done every 3 months for 2 years post operatively followed by every  6 months for 2 years, and then annually.   L-DEX FLOWSHEETS - 06/21/22 0800       L-DEX LYMPHEDEMA SCREENING   Measurement Type Unilateral    L-DEX MEASUREMENT EXTREMITY Upper Extremity    POSITION  Standing    DOMINANT SIDE Right    At Risk Side Right    BASELINE SCORE (UNILATERAL) 0.4    L-DEX SCORE (UNILATERAL) 2.5    VALUE CHANGE (UNILAT) 2.1               Otelia Limes, PTA 06/21/2022, 8:43 AM

## 2022-09-08 DIAGNOSIS — L559 Sunburn, unspecified: Secondary | ICD-10-CM | POA: Diagnosis not present

## 2022-09-08 DIAGNOSIS — D225 Melanocytic nevi of trunk: Secondary | ICD-10-CM | POA: Diagnosis not present

## 2022-09-08 DIAGNOSIS — L578 Other skin changes due to chronic exposure to nonionizing radiation: Secondary | ICD-10-CM | POA: Diagnosis not present

## 2022-09-08 DIAGNOSIS — L700 Acne vulgaris: Secondary | ICD-10-CM | POA: Diagnosis not present

## 2022-09-15 DIAGNOSIS — H5213 Myopia, bilateral: Secondary | ICD-10-CM | POA: Diagnosis not present

## 2022-09-20 ENCOUNTER — Ambulatory Visit: Payer: BC Managed Care – PPO | Attending: Surgery

## 2022-09-20 VITALS — Wt 127.4 lb

## 2022-09-20 DIAGNOSIS — Z483 Aftercare following surgery for neoplasm: Secondary | ICD-10-CM

## 2022-09-20 NOTE — Therapy (Signed)
OUTPATIENT PHYSICAL THERAPY SOZO SCREENING NOTE   Patient Name: [name] MRN: [id number] DOB:04-27-1976, 48 y.o., female Today's Date: 09/20/2022  PCP: Serena Croissant, MD REFERRING PROVIDER: Abigail Miyamoto, MD   PT End of Session - 09/20/22 [phone]     Visit Number 10   # unchanged due to screen only   PT Start Time 0805    PT Stop Time 0810    PT Time Calculation (min) 5 min    Activity Tolerance Patient tolerated treatment well    Behavior During Therapy [facility] for tasks assessed/performed             Past Medical History:  Diagnosis Date   Aseptic necrosis bone ([facility]) 09/28/2011   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 Cyndie Chime consulting w/Dr. Huntley Dec for this   Leukopenia 09/29/2011   Parotid mass 09/28/2011   S/P excision benign mass R parotid 12/09   Past Surgical History:  Procedure Laterality Date   BREAST BIOPSY Right 11/13/2021   BREAST LUMPECTOMY WITH RADIOACTIVE SEED AND SENTINEL LYMPH NODE BIOPSY Right 12/23/2021   Procedure: RADIOACTIVE SEED GUIDED RIGHT BREAST LUMPECTOMY AND SENTINEL NODE BIOPSY;  Surgeon: Abigail Miyamoto, MD;  Location: [facility];  Service: General;  Laterality: Right;   CESAREAN SECTION  05/31/2006   CYST EXCISION  2020   R axillary   FEMUR SURGERY Right 2009   Fibula bone grafted to R femur   FINGER FRACTURE SURGERY Right 1992   Ring finger   LAPAROSCOPIC ASSISTED VAGINAL HYSTERECTOMY N/A 10/25/2012   Procedure: LAPAROSCOPIC ASSISTED VAGINAL HYSTERECTOMY;  Surgeon: Leslie Andrea, MD;  Location: [facility];  Service: Gynecology;  Laterality: N/A;  with aspiration of left ovarian cyst   mammopexy Bilateral    MASS EXCISION Left 04/2008   R parotid   TUBAL LIGATION     Feb 2012   Patient Active Problem List   Diagnosis Date Noted   Basal cell carcinoma  of scalp 05/18/2022   Gene mutation 12/21/2021   Genetic testing 12/11/2021   Family history of colon cancer in mother 12/02/2021   Malignant neoplasm of upper-outer quadrant of right breast in female, estrogen receptor positive 11/26/2021   History of laparoscopic-assisted vaginal hysterectomy 10/22/2019   Leukopenia 09/29/2011   Chronic idiopathic monocytosis 09/29/2011   Chronic ITP (idiopathic thrombocytopenia) 09/28/2011   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 She underwent a right lumpectomy and sentinel node biopsy (1 negative node) on 12/23/2021. It is ER/PR positive and HER2 negative with a Ki67 of 10%.   PRECAUTIONS: right UE Lymphedema risk, None  SUBJECTIVE: Pt returns for her 3 month L-Dex screen.  PAIN:  Are you having pain? No  SOZO SCREENING: Patient was assessed today using the SOZO machine to determine the lymphedema index score. This was compared to her baseline score. It was determined that she is within the recommended range when compared to her baseline and no further action is needed at this time. She will continue SOZO screenings. These are done every 3 months for 2 years post operatively followed by every 6 months for  2 years, and then annually.   L-DEX FLOWSHEETS - 09/20/22 0800       L-DEX LYMPHEDEMA SCREENING   Measurement Type Unilateral    L-DEX MEASUREMENT EXTREMITY Upper Extremity    POSITION  Standing    DOMINANT SIDE Right    At Risk Side Right    BASELINE SCORE (UNILATERAL) 0.4    L-DEX SCORE (UNILATERAL) -0.8    VALUE CHANGE (UNILAT) -1.2               Hermenia Bers, PTA 09/20/2022, 8:10 AM

## 2022-11-03 NOTE — Progress Notes (Signed)
Patient Care Team: Heather Croissant, MD as PCP - General (Hematology and Oncology) Abigail Miyamoto, MD as Consulting Physician (General Surgery) Heather Croissant, MD as Consulting Physician (Hematology and Oncology) Dorothy Puffer, MD as Consulting Physician (Radiation Oncology) Charlott Rakes, MD as Consulting Physician (Gastroenterology)  DIAGNOSIS:  Encounter Diagnosis  Name Primary?   Malignant neoplasm of upper-outer quadrant of right breast in female, estrogen receptor positive (HCC) Yes    SUMMARY OF ONCOLOGIC HISTORY: Oncology History  Malignant neoplasm of upper-outer quadrant of right breast in female, estrogen receptor positive (HCC)  11/13/2021 Initial Diagnosis   Mammogram detected right breast asymmetry with calcifications, mass detected at ultrasound at 10 o'clock position measuring 0.7 cm, axilla negative, biopsy: Grade 1 IDC with tubular features ER 100%, PR 95%, Ki-67 10%, HER2 negative   12/02/2021 Cancer Staging   Staging form: Breast, AJCC 8th Edition - Clinical: Stage IA (cT1b, cN0, cM0, G1, ER+, PR+, HER2-) - Signed by Heather Croissant, MD on 12/02/2021 Stage prefix: Initial diagnosis Histologic grading system: 3 grade system    Genetic Testing   [facility] CancerNext-Expanded Panel was Positive.  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 This result is consistent with a diagnosis of moderately-increased lifetime colorectal cancer risk, NOT classic familial adenomatous polyposis (FAP) or attenuated FAP (AFAP). Report date is 12/14/2021.  The CancerNext-Expanded gene panel offered by [facility] and includes sequencing, rearrangement, and RNA analysis for the following 77 genes: AIP, ALK, APC, ATM, AXIN2, BAP1, BARD1, BLM, BMPR1A, BRCA1, BRCA2, BRIP1, CDC73, CDH1, CDK4, CDKN1B, CDKN2A, CHEK2, CTNNA1, DICER1, FANCC, FH, FLCN, GALNT12, KIF1B, LZTR1, MAX, MEN1, MET, MLH1, MSH2, MSH3, MSH6, MUTYH, NBN, NF1, NF2, NTHL1,  PALB2, PHOX2B, PMS2, POT1, PRKAR1A, PTCH1, PTEN, RAD51C, RAD51D, RB1, RECQL, RET, SDHA, SDHAF2, SDHB, SDHC, SDHD, SMAD4, SMARCA4, SMARCB1, SMARCE1, STK11, SUFU, TMEM127, TP53, TSC1, TSC2, VHL and XRCC2 (sequencing and deletion/duplication); EGFR, EGLN1, HOXB13, KIT, MITF, PDGFRA, POLD1, and POLE (sequencing only); EPCAM and GREM1 (deletion/duplication only).    12/23/2021 Surgery   BREAST, RIGHT, LUMPECTOMY, Pathologic Stage Classification (pTNM, AJCC 8th Edition): pT1b, pN0   12/23/2021 Surgery   Rt Lumpectomy 0.8 cm, Grade 1, Margins Neg, 0/1 LN Neg, ER 100%, PR 95%, Ki-67 10%, HER2 negative   01/27/2022 - 02/24/2022 Radiation Therapy   Site Technique Total Dose (Gy) Dose per Fx (Gy) Completed Fx Beam Energies  Breast, Right: Breast_R 3D 42.56/42.56 2.66 16/16 6XFFF  Breast, Right: Breast_R_Bst 3D 8/8 2 4/4 6X, 10X     02/2022 -  Anti-estrogen oral therapy   20 mg Tamoxifen x 5-10 years     CHIEF COMPLIANT: Follow up tamoxifen/ biopsy  INTERVAL HISTORY: [name] is a 77 y.yo female with the above mentioned surveillance on tamoxifen. She presents to the clinic today for a follow-up. She reports that she tolerating the tamoxifen fairly well She does have some moderate hot flashes.  They wake her up at night and then she has difficulty with sleeping.  She was discussing with her gynecologist about using Veozah.  ALLERGIES:  is allergic to bacitracin-polymyxin b, mupirocin, amoxicillin, and other.  MEDICATIONS:  Current Outpatient Medications  Medication Sig Dispense Refill   calcium carbonate (OS-CAL) 1250 (500 Ca) MG chewable tablet Chew by mouth.     cholecalciferol (VITAMIN D3) 25 MCG (1000 UNIT) tablet Take 1,000 Units by mouth daily.     Multiple Vitamin (MULTIVITAMIN WITH MINERALS) TABS tablet Take 1 tablet by mouth daily.     spironolactone (ALDACTONE)  25 MG tablet Take 75 mg by mouth daily.     tamoxifen (NOLVADEX) 20 MG tablet Take 1 tablet (20 mg total) by mouth daily.  90 tablet 3   No current facility-administered medications for this visit.    PHYSICAL EXAMINATION: ECOG PERFORMANCE STATUS: 1 - Symptomatic but completely ambulatory  Vitals:   11/17/22 0813  BP: 101/64  Pulse: 74  Resp: 18  Temp: (!) 97.2 F (36.2 C)  SpO2: 100%   Filed Weights   11/17/22 0813  Weight: 129 lb 8 oz (58.7 kg)     LABORATORY DATA:  I have reviewed the data as listed    Latest Ref Rng & Units 12/02/2021    8:02 AM 10/20/2012    9:45 AM 09/04/2012   10:18 AM  CMP  Glucose 70 - 99 mg/dL 84  87  85   BUN 6 - 20 mg/dL 10  13  98.1   Creatinine 0.44 - 1.00 mg/dL 1.91  4.78  0.8   Sodium 135 - 145 mmol/L 137  136  139   Potassium 3.5 - 5.1 mmol/L 4.1  4.3  4.5   Chloride 98 - 111 mmol/L 107  104  107   CO2 22 - 32 mmol/L 25  26  25    Calcium 8.9 - 10.3 mg/dL 9.4  9.9  29.5   Total Protein 6.5 - 8.1 g/dL 6.5   7.1   Total Bilirubin 0.3 - 1.2 mg/dL 0.5   6.21   Alkaline Phos 38 - 126 U/L 53   56   AST 15 - 41 U/L 18   17   ALT 0 - 44 U/L 12   7     Lab Results  Component Value Date   WBC 4.8 12/02/2021   HGB 14.0 12/02/2021   HCT 41.6 12/02/2021   MCV 92.0 12/02/2021   PLT 244 12/02/2021   NEUTROABS 3.0 12/02/2021    ASSESSMENT & PLAN:  Malignant neoplasm of upper-outer quadrant of right breast in female, estrogen receptor positive (HCC) 11/13/2021:Mammogram detected right breast asymmetry with calcifications, mass detected at ultrasound at 10 o'clock position measuring 0.7 cm, axilla negative, biopsy: Grade 1 IDC with tubular features ER 100%, PR 95%, Ki-67 10%, HER2 negative    12/23/21: Rt Lumpectomy 0.8 cm, Grade 1, Margins Neg, 0/1 LN Neg, ER 100%, PR 95%, Ki-67 10%, HER2 negative   Plan: 1. Adjuvant radiation therapy 01/28/2022-02/24/2022 2. Adjuvant antiestrogen therapy with tamoxifen started 02/23/2022   Tamoxifen toxicities: Hot flashes: Moderate in severity wake her up at night.  I recommended that she take tamoxifen at bedtime.  She is  discussing with her primary care if Allyne Gee may be an option.  I discussed with her about Effexor as well. Denies any jaw joint or muscle stiffness or achiness.  Denies any mood swings or irritability.  Breast cancer surveillance: Mammogram 11/04/2022: 7 mm group of coarse calcifications, biopsy 11/04/2022: Fibrocystic change negative for cancer  Return to clinic in 1 year for follow-up    No orders of the defined types were placed in this encounter.  The patient has a good understanding of the overall plan. she agrees with it. she will call with any problems that may develop before the next visit here. Total time spent: 30 mins including face to face time and time spent for planning, charting and co-ordination of care   Tamsen Meek, MD 11/17/22    I Janan Ridge am acting as a Neurosurgeon for Dr.Loree [employer]  I have reviewed the above documentation for accuracy and completeness, and I agree with the above.

## 2022-11-04 ENCOUNTER — Ambulatory Visit
Admission: RE | Admit: 2022-11-04 | Discharge: 2022-11-04 | Disposition: A | Discharge status: Home / Self Care | Payer: BC Managed Care – PPO | Source: Ambulatory Visit | Attending: Adult Health | Admitting: Adult Health

## 2022-11-04 ENCOUNTER — Other Ambulatory Visit: Payer: Self-pay | Admitting: Adult Health

## 2022-11-04 DIAGNOSIS — R921 Mammographic calcification found on diagnostic imaging of breast: Secondary | ICD-10-CM

## 2022-11-04 DIAGNOSIS — N641 Fat necrosis of breast: Secondary | ICD-10-CM | POA: Diagnosis not present

## 2022-11-04 DIAGNOSIS — N6011 Diffuse cystic mastopathy of right breast: Secondary | ICD-10-CM | POA: Diagnosis not present

## 2022-11-04 DIAGNOSIS — C50411 Malignant neoplasm of upper-outer quadrant of right female breast: Secondary | ICD-10-CM

## 2022-11-04 DIAGNOSIS — R92333 Mammographic heterogeneous density, bilateral breasts: Secondary | ICD-10-CM | POA: Diagnosis not present

## 2022-11-04 DIAGNOSIS — N6021 Fibroadenosis of right breast: Secondary | ICD-10-CM | POA: Diagnosis not present

## 2022-11-04 HISTORY — PX: BREAST BIOPSY: SHX20

## 2022-11-04 HISTORY — DX: Personal history of irradiation: Z92.3

## 2022-11-10 DIAGNOSIS — Z01419 Encounter for gynecological examination (general) (routine) without abnormal findings: Secondary | ICD-10-CM | POA: Diagnosis not present

## 2022-11-10 DIAGNOSIS — Z1272 Encounter for screening for malignant neoplasm of vagina: Secondary | ICD-10-CM | POA: Diagnosis not present

## 2022-11-10 DIAGNOSIS — Z6821 Body mass index (BMI) 21.0-21.9, adult: Secondary | ICD-10-CM | POA: Diagnosis not present

## 2022-11-17 ENCOUNTER — Inpatient Hospital Stay: Payer: BC Managed Care – PPO | Attending: Hematology and Oncology | Admitting: Hematology and Oncology

## 2022-11-17 ENCOUNTER — Other Ambulatory Visit: Payer: Self-pay

## 2022-11-17 VITALS — BP 101/64 | HR 74 | Temp 97.2°F | Resp 18 | Ht 65.0 in | Wt 129.5 lb

## 2022-11-17 DIAGNOSIS — R232 Flushing: Secondary | ICD-10-CM | POA: Diagnosis not present

## 2022-11-17 DIAGNOSIS — Z170 Estrogen receptor positive status [ER+]: Secondary | ICD-10-CM | POA: Diagnosis not present

## 2022-11-17 DIAGNOSIS — C50411 Malignant neoplasm of upper-outer quadrant of right female breast: Secondary | ICD-10-CM | POA: Insufficient documentation

## 2022-11-17 DIAGNOSIS — Z79810 Long term (current) use of selective estrogen receptor modulators (SERMs): Secondary | ICD-10-CM | POA: Insufficient documentation

## 2022-11-17 DIAGNOSIS — Z923 Personal history of irradiation: Secondary | ICD-10-CM | POA: Insufficient documentation

## 2022-11-17 NOTE — Assessment & Plan Note (Addendum)
11/13/2021:Mammogram detected right breast asymmetry with calcifications, mass detected at ultrasound at 10 o'clock position measuring 0.7 cm, axilla negative, biopsy: Grade 1 IDC with tubular features ER 100%, PR 95%, Ki-67 10%, HER2 negative    12/23/21: Rt Lumpectomy 0.8 cm, Grade 1, Margins Neg, 0/1 LN Neg, ER 100%, PR 95%, Ki-67 10%, HER2 negative   Plan: 1. Adjuvant radiation therapy 01/28/2022-02/24/2022 2. Adjuvant antiestrogen therapy with tamoxifen started at 10 mg on 02/23/2022   Tamoxifen toxicities: Hot flashes: Moderate in severity wake her up at night.  I recommended that she take tamoxifen at bedtime.  She is discussing with her primary care if Allyne Gee may be an option.  I discussed with her about Effexor as well. Denies any jaw joint or muscle stiffness or achiness.  Denies any mood swings or irritability.  Breast cancer surveillance: Breast exam 11/17/2022: Benign Mammogram 11/04/2022: 7 mm group of coarse calcifications, biopsy 11/04/2022: Fibrocystic change negative for cancer  Return to clinic in 1 year for follow-up

## 2022-12-20 ENCOUNTER — Encounter: Payer: Self-pay | Admitting: Rehabilitation

## 2022-12-20 ENCOUNTER — Ambulatory Visit: Payer: BC Managed Care – PPO | Attending: Surgery | Admitting: Rehabilitation

## 2022-12-20 DIAGNOSIS — Z170 Estrogen receptor positive status [ER+]: Secondary | ICD-10-CM | POA: Insufficient documentation

## 2022-12-20 DIAGNOSIS — C50411 Malignant neoplasm of upper-outer quadrant of right female breast: Secondary | ICD-10-CM | POA: Insufficient documentation

## 2022-12-20 DIAGNOSIS — Z483 Aftercare following surgery for neoplasm: Secondary | ICD-10-CM | POA: Insufficient documentation

## 2022-12-20 NOTE — Therapy (Signed)
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 Cyndie Chime consulting w/Dr. Huntley Dec for this   Leukopenia 09/29/2011   Parotid mass 09/28/2011   S/P excision benign mass R parotid 12/09   Personal history of radiation therapy    Past Surgical History:  Procedure Laterality Date   BREAST BIOPSY Right 11/13/2021   BREAST BIOPSY Right 11/04/2022   MM RT BREAST BX W LOC DEV 1ST LESION IMAGE BX SPEC STEREO GUIDE 11/04/2022 [facility] MAMMOGRAPHY   BREAST LUMPECTOMY WITH RADIOACTIVE SEED AND SENTINEL LYMPH NODE BIOPSY Right 12/23/2021   Procedure: RADIOACTIVE SEED GUIDED RIGHT BREAST LUMPECTOMY AND SENTINEL NODE BIOPSY;  Surgeon: Abigail Miyamoto, MD;  Location: [facility] OR;  Service: General;  Laterality: Right;   CESAREAN SECTION  05/31/2006   CYST EXCISION  2020   R axillary   FEMUR SURGERY Right 2009   Fibula bone grafted to R femur   FINGER FRACTURE SURGERY Right 1992   Ring finger   LAPAROSCOPIC ASSISTED VAGINAL HYSTERECTOMY N/A 10/25/2012   Procedure: LAPAROSCOPIC ASSISTED VAGINAL HYSTERECTOMY;  Surgeon: Leslie Andrea, MD;  Location: [facility];  Service: Gynecology;  Laterality: N/A;  with aspiration of left ovarian cyst   mammopexy Bilateral     MASS EXCISION Left 04/2008   R parotid   TUBAL LIGATION     Feb 2012   Patient Active Problem List   Diagnosis Date Noted   Basal cell carcinoma of scalp 05/18/2022   Gene mutation 12/21/2021   Genetic testing 12/11/2021   Family history of colon cancer in mother 12/02/2021   Malignant neoplasm of upper-outer quadrant of right breast in female, estrogen receptor positive (HCC) 11/26/2021   History of laparoscopic-assisted vaginal hysterectomy 10/22/2019   Leukopenia 09/29/2011   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 She underwent a right lumpectomy and sentinel node biopsy (1 negative node) on 12/23/2021. It is ER/PR positive and HER2 negative with a Ki67 of 10%.   PRECAUTIONS: right UE Lymphedema risk, None  SUBJECTIVE: Pt returns for her 3 month L-Dex screen.  PAIN:  Are you having pain? No  SOZO SCREENING: Patient was assessed today using the SOZO machine to determine the lymphedema index score. This was compared to her baseline  score. It was determined that she is within the recommended range when compared to her baseline and no further action is needed at this time. She will continue SOZO screenings. These are done every 3 months for 2 years post operatively followed by every 6 months for 2 years, and then annually.   L-DEX FLOWSHEETS - 12/20/22 0800       L-DEX LYMPHEDEMA SCREENING   Measurement Type Unilateral    L-DEX MEASUREMENT EXTREMITY Upper Extremity    POSITION  Standing    DOMINANT SIDE Right     At Risk Side Right    BASELINE SCORE (UNILATERAL) 0.4    L-DEX SCORE (UNILATERAL) -1.3    VALUE CHANGE (UNILAT) -1.7               Marlia Schewe R, PT 12/20/2022, 8:02 AM

## 2023-01-27 DIAGNOSIS — H02884 Meibomian gland dysfunction left upper eyelid: Secondary | ICD-10-CM | POA: Diagnosis not present

## 2023-01-27 DIAGNOSIS — H02882 Meibomian gland dysfunction right lower eyelid: Secondary | ICD-10-CM | POA: Diagnosis not present

## 2023-01-27 DIAGNOSIS — H02881 Meibomian gland dysfunction right upper eyelid: Secondary | ICD-10-CM | POA: Diagnosis not present

## 2023-01-27 DIAGNOSIS — H02885 Meibomian gland dysfunction left lower eyelid: Secondary | ICD-10-CM | POA: Diagnosis not present

## 2023-03-18 DIAGNOSIS — L578 Other skin changes due to chronic exposure to nonionizing radiation: Secondary | ICD-10-CM | POA: Diagnosis not present

## 2023-03-18 DIAGNOSIS — D225 Melanocytic nevi of trunk: Secondary | ICD-10-CM | POA: Diagnosis not present

## 2023-03-18 DIAGNOSIS — L700 Acne vulgaris: Secondary | ICD-10-CM | POA: Diagnosis not present

## 2023-03-21 ENCOUNTER — Other Ambulatory Visit: Payer: Self-pay | Admitting: Hematology and Oncology

## 2023-03-21 NOTE — Telephone Encounter (Signed)
Per last OV, continue tamoxifen. Gardiner Rhyme, RN

## 2023-03-28 ENCOUNTER — Ambulatory Visit: Payer: BC Managed Care – PPO | Attending: Surgery

## 2023-03-28 VITALS — Wt 130.5 lb

## 2023-03-28 DIAGNOSIS — Z483 Aftercare following surgery for neoplasm: Secondary | ICD-10-CM | POA: Insufficient documentation

## 2023-03-28 NOTE — Therapy (Addendum)
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 Cyndie Chime consulting w/Dr. Huntley Dec for this   Leukopenia 09/29/2011   Parotid mass 09/28/2011   S/P excision benign mass R parotid 12/09   Personal history of radiation therapy    Past Surgical History:  Procedure Laterality Date   BREAST BIOPSY Right 11/13/2021   BREAST BIOPSY Right 11/04/2022   MM RT BREAST BX W LOC DEV 1ST LESION IMAGE BX SPEC STEREO GUIDE 11/04/2022 [facility] MAMMOGRAPHY   BREAST LUMPECTOMY WITH RADIOACTIVE SEED AND SENTINEL LYMPH NODE BIOPSY Right 12/23/2021   Procedure: RADIOACTIVE SEED GUIDED RIGHT BREAST LUMPECTOMY AND SENTINEL NODE BIOPSY;  Surgeon: Abigail Miyamoto, MD;  Location: [facility];  Service: General;  Laterality: Right;   CESAREAN SECTION  05/31/2006   CYST EXCISION  2020   R axillary   FEMUR SURGERY Right 2009   Fibula bone grafted to R femur   FINGER FRACTURE SURGERY Right 1992   Ring finger   LAPAROSCOPIC ASSISTED VAGINAL HYSTERECTOMY N/A 10/25/2012   Procedure: LAPAROSCOPIC ASSISTED VAGINAL HYSTERECTOMY;  Surgeon: Leslie Andrea, MD;  Location: [facility];  Service: Gynecology;  Laterality: N/A;  with aspiration of left ovarian cyst    mammopexy Bilateral    MASS EXCISION Left 04/2008   R parotid   TUBAL LIGATION     Feb 2012   Patient Active Problem List   Diagnosis Date Noted   Basal cell carcinoma of scalp 05/18/2022   Gene mutation 12/21/2021   Genetic testing 12/11/2021   Family history of colon cancer in mother 12/02/2021   Malignant neoplasm of upper-outer quadrant of right breast in female, estrogen receptor positive (HCC) 11/26/2021   History of laparoscopic-assisted vaginal hysterectomy 10/22/2019   Leukopenia 09/29/2011   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 She underwent a right lumpectomy and sentinel node biopsy (1 negative node) on 12/23/2021. It is ER/PR positive and HER2 negative with a Ki67 of 10%.   PRECAUTIONS: right UE Lymphedema risk, None  SUBJECTIVE: Pt returns for her 3 month L-Dex screen.  PAIN:  Are you having pain? No  SOZO SCREENING: Patient was assessed today using the SOZO machine to determine the lymphedema index score. This was compared to her baseline score. It was determined that she is within the recommended range  when compared to her baseline and no further action is needed at this time. She will continue SOZO screenings. These are done every 3 months for 2 years post operatively followed by every 6 months for 2 years, and then annually.   L-DEX FLOWSHEETS - 03/28/23 0900       L-DEX LYMPHEDEMA SCREENING   Measurement Type Unilateral    L-DEX MEASUREMENT EXTREMITY Upper Extremity    POSITION  Standing    DOMINANT SIDE Right    At Risk Side Right    BASELINE SCORE (UNILATERAL) 0.4    L-DEX SCORE (UNILATERAL)  2.9    VALUE CHANGE (UNILAT) 2.5               Hermenia Bers, PTA 03/28/2023, 9:14 AM

## 2023-04-18 DIAGNOSIS — Z862 Personal history of diseases of the blood and blood-forming organs and certain disorders involving the immune mechanism: Secondary | ICD-10-CM | POA: Diagnosis not present

## 2023-04-18 DIAGNOSIS — K649 Unspecified hemorrhoids: Secondary | ICD-10-CM | POA: Diagnosis not present

## 2023-04-18 DIAGNOSIS — Z131 Encounter for screening for diabetes mellitus: Secondary | ICD-10-CM | POA: Diagnosis not present

## 2023-04-18 DIAGNOSIS — E7800 Pure hypercholesterolemia, unspecified: Secondary | ICD-10-CM | POA: Diagnosis not present

## 2023-04-18 DIAGNOSIS — Z0000 Encounter for general adult medical examination without abnormal findings: Secondary | ICD-10-CM | POA: Diagnosis not present

## 2023-06-02 DIAGNOSIS — Z713 Dietary counseling and surveillance: Secondary | ICD-10-CM | POA: Diagnosis not present

## 2023-06-02 DIAGNOSIS — E7800 Pure hypercholesterolemia, unspecified: Secondary | ICD-10-CM | POA: Diagnosis not present

## 2023-06-02 DIAGNOSIS — R7303 Prediabetes: Secondary | ICD-10-CM | POA: Diagnosis not present

## 2023-06-14 DIAGNOSIS — K648 Other hemorrhoids: Secondary | ICD-10-CM | POA: Diagnosis not present

## 2023-06-14 DIAGNOSIS — K6289 Other specified diseases of anus and rectum: Secondary | ICD-10-CM | POA: Diagnosis not present

## 2023-06-22 DIAGNOSIS — R7303 Prediabetes: Secondary | ICD-10-CM | POA: Diagnosis not present

## 2023-06-27 ENCOUNTER — Ambulatory Visit: Payer: BC Managed Care – PPO | Attending: Surgery

## 2023-06-27 VITALS — Wt 132.0 lb

## 2023-06-27 DIAGNOSIS — Z483 Aftercare following surgery for neoplasm: Secondary | ICD-10-CM | POA: Insufficient documentation

## 2023-06-27 NOTE — Therapy (Signed)
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 Cyndie Chime consulting w/Dr. Huntley Dec for this   Leukopenia 09/29/2011   Parotid mass 09/28/2011   S/P excision benign mass R parotid 12/09   Personal history of radiation therapy    Past Surgical History:  Procedure Laterality Date   BREAST BIOPSY Right 11/13/2021   BREAST BIOPSY Right 11/04/2022   MM RT BREAST BX W LOC DEV 1ST LESION IMAGE BX SPEC STEREO GUIDE 11/04/2022 [facility]AMMOGRAPHY   BREAST LUMPECTOMY WITH RADIOACTIVE SEED AND SENTINEL LYMPH NODE BIOPSY Right 12/23/2021   Procedure: RADIOACTIVE SEED GUIDED RIGHT BREAST LUMPECTOMY AND SENTINEL NODE BIOPSY;  Surgeon: Abigail Miyamoto, MD;  Location: [facility];  Service: General;  Laterality: Right;   CESAREAN SECTION  05/31/2006   CYST EXCISION  2020   R axillary   FEMUR SURGERY Right 2009   Fibula bone grafted to R femur   FINGER FRACTURE SURGERY Right 1992   Ring finger   LAPAROSCOPIC ASSISTED VAGINAL HYSTERECTOMY N/A 10/25/2012   Procedure: LAPAROSCOPIC ASSISTED VAGINAL HYSTERECTOMY;  Surgeon: Leslie Andrea, MD;  Location: [facility];  Service: Gynecology;  Laterality: N/A;  with aspiration of left ovarian cyst    mammopexy Bilateral    MASS EXCISION Left 04/2008   R parotid   TUBAL LIGATION     Feb 2012   Patient Active Problem List   Diagnosis Date Noted   Basal cell carcinoma of scalp 05/18/2022   Gene mutation 12/21/2021   Genetic testing 12/11/2021   Family history of colon cancer in mother 12/02/2021   Malignant neoplasm of upper-outer quadrant of right breast in female, estrogen receptor positive (HCC) 11/26/2021   History of laparoscopic-assisted vaginal hysterectomy 10/22/2019   Leukopenia 09/29/2011   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 She underwent a right lumpectomy and sentinel node biopsy (1 negative node) on 12/23/2021. It is ER/PR positive and HER2 negative with a Ki67 of 10%.   PRECAUTIONS: right UE Lymphedema risk, None  SUBJECTIVE: Pt returns for her 3 month L-Dex screen.  PAIN:  Are you having pain? No  SOZO SCREENING: Patient was assessed today using the SOZO machine to determine the lymphedema index score. This was compared to her baseline score. It was determined that she is within the recommended range  when compared to her baseline and no further action is needed at this time. She will continue SOZO screenings. These are done every 3 months for 2 years post operatively followed by every 6 months for 2 years, and then annually.   L-DEX FLOWSHEETS - 06/27/23 0900       L-DEX LYMPHEDEMA SCREENING   Measurement Type Unilateral    L-DEX MEASUREMENT EXTREMITY Upper Extremity    POSITION  Standing    DOMINANT SIDE Right    At Risk Side Right    BASELINE SCORE (UNILATERAL) 0.4    L-DEX SCORE (UNILATERAL)  3.6    VALUE CHANGE (UNILAT) 3.2            P: Begin 6 month L-Dex screens next.    Hermenia Bers, PTA 06/27/2023, 9:21 AM

## 2023-07-29 ENCOUNTER — Telehealth: Payer: Self-pay

## 2023-07-29 DIAGNOSIS — R7303 Prediabetes: Secondary | ICD-10-CM | POA: Diagnosis not present

## 2023-07-29 DIAGNOSIS — R609 Edema, unspecified: Secondary | ICD-10-CM | POA: Diagnosis not present

## 2023-07-29 NOTE — Telephone Encounter (Signed)
 Ms.Kanan had left our office a VM yesterday afternoon to call her back. Called pt back this morning. She had concerns about new bil LE and ankle swelling that is worse at the end of her day x 1 week. She reports that this is not normal for her at all. She wanted to know if this was related to her SLNB from her breast CA surgery x 2 yrs ago, hence calling our office. Explained to her that bil LE swelling is completely unrelated to her SLNB and instead could be due to a possible organ issue, whether kidneys are having trouble filtrating fluid or a possible cardiac issue. Also explained that these are just some possibilities but either way she should call her MD since these are new symptoms for her and have been consistent for a week now. She denies symptoms of SOB or pain in LE's. Advised her to start with calling her oncologist in case this is some how related to her previous cancer treatments, and they can advise her from there. Pt verbalized understanding and that her doctor opens at 830 and will call then.

## 2023-08-11 DIAGNOSIS — J069 Acute upper respiratory infection, unspecified: Secondary | ICD-10-CM | POA: Diagnosis not present

## 2023-09-21 ENCOUNTER — Other Ambulatory Visit: Payer: Self-pay | Admitting: Obstetrics and Gynecology

## 2023-09-21 DIAGNOSIS — Z9889 Other specified postprocedural states: Secondary | ICD-10-CM

## 2023-09-21 DIAGNOSIS — Z853 Personal history of malignant neoplasm of breast: Secondary | ICD-10-CM

## 2023-09-26 ENCOUNTER — Ambulatory Visit: Payer: BC Managed Care – PPO | Attending: Surgery

## 2023-09-26 VITALS — Wt 128.5 lb

## 2023-09-26 DIAGNOSIS — Z483 Aftercare following surgery for neoplasm: Secondary | ICD-10-CM | POA: Insufficient documentation

## 2023-09-26 NOTE — Therapy (Signed)
 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 Isidor Marek consulting w/Dr. Tomlin for this   Leukopenia 09/29/2011   Parotid mass 09/28/2011   S/P excision benign mass R parotid 12/09   Personal history of radiation therapy    Past Surgical History:  Procedure Laterality Date   BREAST BIOPSY Right 11/13/2021   BREAST BIOPSY Right 11/04/2022   MM RT BREAST BX W LOC DEV 1ST LESION IMAGE BX SPEC STEREO GUIDE 11/04/2022 [facility] MAMMOGRAP[facility]   BREAST LUMPECTOMY WITH RADIOACTIVE SEED AND SENTINEL LYMPH NODE BIOPSY Right 12/23/2021   Procedure: RADIOACTIVE SEED GUIDED RIGHT BREAST LUMPECTOMY AND SENTINEL NODE BIOPSY;  Surgeon: Oza Blumenthal, MD;  Location: [facility] OR;  Service: General;  Laterality: Right;   CESAREAN SECTION  05/31/2006   CYST EXCISION  2020   R axillary   FEMUR SURGERY Right 2009   Fibula bone grafted to R femur   FINGER FRACTURE SURGERY Right 1992   Ring finger   LAPAROSCOPIC ASSISTED VAGINAL HYSTERECTOMY N/A 10/25/2012   Procedure: LAPAROSCOPIC ASSISTED VAGINAL HYSTERECTOMY;  Surgeon: Jeanmarie Millet, MD;  Location: [facility];  Service: Gynecology;  Laterality: N/A;  with aspiration of left ovarian cyst    mammopexy Bilateral    MASS EXCISION Left 04/2008   R parotid   TUBAL LIGATION     Feb 2012   Patient Active Problem List   Diagnosis Date Noted   Basal cell carcinoma of scalp 05/18/2022   Gene mutation 12/21/2021   Genetic testing 12/11/2021   Family history of colon cancer in mother 12/02/2021   Malignant neoplasm of upper-outer quadrant of right breast in female, estrogen receptor positive (HCC) 11/26/2021   History of laparoscopic-assisted vaginal hysterectomy 10/22/2019   Leukopenia 09/29/2011   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 She underwent a right lumpectomy and sentinel node biopsy (1 negative node) on 12/23/2021. It is ER/PR positive and HER2 negative with a Ki67 of 10%.   PRECAUTIONS: right UE Lymphedema risk, None  SUBJECTIVE: Pt returns for her last 3 month L-Dex screen.  PAIN:  Are you having pain? No  SOZO SCREENING: Patient was assessed today using the SOZO machine to determine the lymphedema index score. This was compared to her baseline score. It was determined that she is within the recommended  range when compared to her baseline and no further action is needed at this time. She will continue SOZO screenings. These are done every 3 months for 2 years post operatively followed by every 6 months for 2 years, and then annually.   L-DEX FLOWSHEETS - 09/26/23 0800       L-DEX LYMPHEDEMA SCREENING   Measurement Type Unilateral    L-DEX MEASUREMENT EXTREMITY Upper Extremity    POSITION  Standing    DOMINANT SIDE Right    At Risk Side Right    BASELINE SCORE (UNILATERAL) 0.4    L-DEX SCORE  (UNILATERAL) 5.3    VALUE CHANGE (UNILAT) 4.9            P: Cont 6 month L-Dex screens next.    Denyce Flank, PTA 09/26/2023, 9:00 AM

## 2023-11-01 ENCOUNTER — Other Ambulatory Visit: Payer: Self-pay | Admitting: Medical Genetics

## 2023-11-08 ENCOUNTER — Ambulatory Visit
Admission: RE | Admit: 2023-11-08 | Discharge: 2023-11-08 | Disposition: A | Discharge status: Home / Self Care | Source: Ambulatory Visit | Attending: Obstetrics and Gynecology | Admitting: Obstetrics and Gynecology

## 2023-11-08 DIAGNOSIS — Z853 Personal history of malignant neoplasm of breast: Secondary | ICD-10-CM | POA: Diagnosis not present

## 2023-11-08 DIAGNOSIS — Z9889 Other specified postprocedural states: Secondary | ICD-10-CM

## 2023-11-11 DIAGNOSIS — Z01419 Encounter for gynecological examination (general) (routine) without abnormal findings: Secondary | ICD-10-CM | POA: Diagnosis not present

## 2023-11-11 DIAGNOSIS — L570 Actinic keratosis: Secondary | ICD-10-CM | POA: Diagnosis not present

## 2023-11-11 DIAGNOSIS — Z6821 Body mass index (BMI) 21.0-21.9, adult: Secondary | ICD-10-CM | POA: Diagnosis not present

## 2023-11-11 DIAGNOSIS — D485 Neoplasm of uncertain behavior of skin: Secondary | ICD-10-CM | POA: Diagnosis not present

## 2023-11-17 ENCOUNTER — Other Ambulatory Visit

## 2023-11-17 DIAGNOSIS — Z006 Encounter for examination for normal comparison and control in clinical research program: Secondary | ICD-10-CM

## 2023-11-20 NOTE — Assessment & Plan Note (Signed)
 11/13/2021:Mammogram detected right breast asymmetry with calcifications, mass detected at ultrasound at 10 o'clock position measuring 0.7 cm, axilla negative, biopsy: Grade 1 IDC with tubular features ER 100%, PR 95%, Ki-67 10%, HER2 negative    12/23/21: Rt Lumpectomy 0.8 cm, Grade 1, Margins Neg, 0/1 LN Neg, ER 100%, PR 95%, Ki-67 10%, HER2 negative   Plan: 1. Adjuvant radiation therapy 01/28/2022-02/24/2022 2. Adjuvant antiestrogen therapy with tamoxifen  started 02/23/2022   Tamoxifen  toxicities: Hot flashes: Moderate in severity wake her up at night.  I recommended that she take tamoxifen  at bedtime.  She is discussing with her primary care if Veozah may be an option.  I discussed with her about Effexor as well. Denies any jaw joint or muscle stiffness or achiness.  Denies any mood swings or irritability.   Breast cancer surveillance: Mammogram 11/08/2023: Benign Density Cat C   Return to clinic in 1 year for follow-up

## 2023-11-21 ENCOUNTER — Inpatient Hospital Stay

## 2023-11-21 ENCOUNTER — Inpatient Hospital Stay: Payer: BC Managed Care – PPO | Attending: Hematology and Oncology | Admitting: Hematology and Oncology

## 2023-11-21 VITALS — BP 118/64 | HR 79 | Temp 98.3°F | Resp 18 | Ht 65.0 in | Wt 130.7 lb

## 2023-11-21 DIAGNOSIS — Z170 Estrogen receptor positive status [ER+]: Secondary | ICD-10-CM | POA: Diagnosis not present

## 2023-11-21 DIAGNOSIS — N951 Menopausal and female climacteric states: Secondary | ICD-10-CM | POA: Diagnosis not present

## 2023-11-21 DIAGNOSIS — Z79899 Other long term (current) drug therapy: Secondary | ICD-10-CM | POA: Diagnosis not present

## 2023-11-21 DIAGNOSIS — Z1721 Progesterone receptor positive status: Secondary | ICD-10-CM | POA: Diagnosis not present

## 2023-11-21 DIAGNOSIS — C50411 Malignant neoplasm of upper-outer quadrant of right female breast: Secondary | ICD-10-CM | POA: Insufficient documentation

## 2023-11-21 DIAGNOSIS — Z923 Personal history of irradiation: Secondary | ICD-10-CM | POA: Diagnosis not present

## 2023-11-21 DIAGNOSIS — Z1732 Human epidermal growth factor receptor 2 negative status: Secondary | ICD-10-CM | POA: Insufficient documentation

## 2023-11-21 DIAGNOSIS — R7303 Prediabetes: Secondary | ICD-10-CM | POA: Diagnosis not present

## 2023-11-21 MED ORDER — METFORMIN HCL 500 MG PO TABS: 500.0000 mg | ORAL_TABLET | Freq: Every day | ORAL | Status: AC

## 2023-11-21 NOTE — Progress Notes (Signed)
 Patient Care Team: Odean Potts, MD as PCP - General (Hematology and Oncology) Vernetta Berg, MD as Consulting Physician (General Surgery) Odean Potts, MD as Consulting Physician (Hematology and Oncology) Dewey Rush, MD as Consulting Physician (Radiation Oncology) Dianna Specking, MD as Consulting Physician (Gastroenterology)  DIAGNOSIS:  Encounter Diagnosis  Name Primary?   Malignant neoplasm of upper-outer quadrant of right breast in female, estrogen receptor positive (HCC) Yes    SUMMARY OF ONCOLOGIC HISTORY: Oncology History  Malignant neoplasm of upper-outer quadrant of right breast in female, estrogen receptor positive (HCC)  11/13/2021 Initial Diagnosis   Mammogram detected right breast asymmetry with calcifications, mass detected at ultrasound at 10 o'clock position measuring 0.7 cm, axilla negative, biopsy: Grade 1 IDC with tubular features ER 100%, PR 95%, Ki-67 10%, HER2 negative   12/02/2021 Cancer Staging   Staging form: Breast, AJCC 8th Edition - Clinical: Stage IA (cT1b, cN0, cM0, G1, ER+, PR+, HER2-) - Signed by Odean Potts, MD on 12/02/2021 Stage prefix: Initial diagnosis Histologic grading system: 3 grade system    Genetic Testing   [facility] CancerNext-Expanded Panel was Positive.  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 This result is consistent with a diagnosis of moderately-increased lifetime colorectal cancer risk, NOT classic familial adenomatous polyposis (FAP) or attenuated FAP (AFAP). Report date is 12/14/2021.  The CancerNext-Expanded gene panel offered by [facility] and includes sequencing, rearrangement, and RNA analysis for the following 77 genes: AIP, ALK, APC, ATM, AXIN2, BAP1, BARD1, BLM, BMPR1A, BRCA1, BRCA2, BRIP1, CDC73, CDH1, CDK4, CDKN1B, CDKN2A, CHEK2, CTNNA1, DICER1, FANCC, FH, FLCN, GALNT12, KIF1B, LZTR1, MAX, MEN1, MET, MLH1, MSH2, MSH3, MSH6, MUTYH, NBN, NF1, NF2, NTHL1,  PALB2, PHOX2B, PMS2, POT1, PRKAR1A, PTCH1, PTEN, RAD51C, RAD51D, RB1, RECQL, RET, SDHA, SDHAF2, SDHB, SDHC, SDHD, SMAD4, SMARCA4, SMARCB1, SMARCE1, STK11, SUFU, TMEM127, TP53, TSC1, TSC2, VHL and XRCC2 (sequencing and deletion/duplication); EGFR, EGLN1, HOXB13, KIT, MITF, PDGFRA, POLD1, and POLE (sequencing only); EPCAM and GREM1 (deletion/duplication only).    12/23/2021 Surgery   BREAST, RIGHT, LUMPECTOMY, Pathologic Stage Classification (pTNM, AJCC 8th Edition): pT1b, pN0   12/23/2021 Surgery   Rt Lumpectomy 0.8 cm, Grade 1, Margins Neg, 0/1 LN Neg, ER 100%, PR 95%, Ki-67 10%, HER2 negative   01/27/2022 - 02/24/2022 Radiation Therapy   Site Technique Total Dose (Gy) Dose per Fx (Gy) Completed Fx Beam Energies  Breast, Right: Breast_R 3D 42.56/42.56 2.66 16/16 6XFFF  Breast, Right: Breast_R_Bst 3D 8/8 2 4/4 6X, 10X     02/2022 -  Anti-estrogen oral therapy   20 mg Tamoxifen  x 5-10 years     CHIEF COMPLIANT: Follow-up on tamoxifen   HISTORY OF PRESENT ILLNESS:   History of Present Illness [name] is a 48 year old female with breast cancer on tamoxifen  who presents with worsening hot flashes.  Over the past six months, she experiences increased frequency and severity of hot flashes, occurring three to four times daily and at least once at night. These episodes include sweating and disrupted sleep due to feeling overheated.  She has been on tamoxifen , with worsening hot flashes over time. Menopausal status is uncertain due to a hysterectomy 15 years ago, and blood tests to determine menopause status were not completed.  A recent mammogram on June 10th showed dense breast tissue but was otherwise normal.     ALLERGIES:  is allergic to bacitracin -polymyxin b, mupirocin, amoxicillin, and other.  MEDICATIONS:  Current Outpatient Medications  Medication Sig Dispense Refill   cholecalciferol (  VITAMIN D3) 25 MCG (1000 UNIT) tablet Take 1,000 Units by mouth daily.     metFORMIN  (GLUCOPHAGE) 500 MG tablet Take 1 tablet (500 mg total) by mouth daily with breakfast.     Multiple Vitamin (MULTIVITAMIN WITH MINERALS) TABS tablet Take 1 tablet by mouth daily.     spironolactone  (ALDACTONE ) 25 MG tablet Take 75 mg by mouth daily.     tamoxifen  (NOLVADEX ) 20 MG tablet Take 1 tablet (20 mg total) by mouth daily. 90 tablet 3   No current facility-administered medications for this visit.    PHYSICAL EXAMINATION: ECOG PERFORMANCE STATUS: 1 - Symptomatic but completely ambulatory  Vitals:   11/21/23 0859  BP: 118/64  Pulse: 79  Resp: 18  Temp: 98.3 F (36.8 C)  SpO2: 100%   Filed Weights   11/21/23 0859  Weight: 130 lb 11.2 oz (59.3 kg)    Physical Exam   (exam performed in the presence of a chaperone)  LABORATORY DATA:  I have reviewed the data as listed    Latest Ref Rng & Units 12/02/2021    8:02 AM 10/20/2012    9:45 AM 09/04/2012   10:18 AM  CMP  Glucose 70 - 99 mg/dL 84  87  85   BUN 6 - 20 mg/dL 10  13  86.2   Creatinine 0.44 - 1.00 mg/dL 9.11  9.22  0.8   Sodium 135 - 145 mmol/L 137  136  139   Potassium 3.5 - 5.1 mmol/L 4.1  4.3  4.5   Chloride 98 - 111 mmol/L 107  104  107   CO2 22 - 32 mmol/L 25  26  25    Calcium 8.9 - 10.3 mg/dL 9.4  9.9  89.7   Total Protein 6.5 - 8.1 g/dL 6.5   7.1   Total Bilirubin 0.3 - 1.2 mg/dL 0.5   9.57   Alkaline Phos 38 - 126 U/L 53   56   AST 15 - 41 U/L 18   17   ALT 0 - 44 U/L 12   7     Lab Results  Component Value Date   WBC 4.8 12/02/2021   HGB 14.0 12/02/2021   HCT 41.6 12/02/2021   MCV 92.0 12/02/2021   PLT 244 12/02/2021   NEUTROABS 3.0 12/02/2021    ASSESSMENT & PLAN:  Malignant neoplasm of upper-outer quadrant of right breast in female, estrogen receptor positive (HCC) 11/13/2021:Mammogram detected right breast asymmetry with calcifications, mass detected at ultrasound at 10 o'clock position measuring 0.7 cm, axilla negative, biopsy: Grade 1 IDC with tubular features ER 100%, PR 95%, Ki-67 10%,  HER2 negative    12/23/21: Rt Lumpectomy 0.8 cm, Grade 1, Margins Neg, 0/1 LN Neg, ER 100%, PR 95%, Ki-67 10%, HER2 negative   Plan: 1. Adjuvant radiation therapy 01/28/2022-02/24/2022 2. Adjuvant antiestrogen therapy with tamoxifen  started 02/23/2022   Tamoxifen  toxicities: Hot flashes: Moderate in severity wake her up at night.   Denies any jaw joint or muscle stiffness or achiness.  Denies any mood swings or irritability.   Breast cancer surveillance: Mammogram 11/08/2023: Benign Density Cat C   Return to clinic in 1 year for follow-up ------------------------------------- Assessment and Plan Assessment & Plan Malignant neoplasm of right breast, ER+ Currently on tamoxifen . Recent mammogram satisfactory. Transition to screening mammograms appropriate. Discussed Gardant for early cancer DNA detection, not standard care yet. - Perform blood tests for estradiol  and FSH to determine menopausal status. - Set up Guardant blood test for cancer  DNA screening every six months.  Hot flashes due to tamoxifen  Worsening hot flashes. Discussed tamoxifen  dose reduction if menopausal status confirmed. TAMO1 study supports lower doses for similar benefits. - Consider reducing tamoxifen  dose if menopausal status is confirmed.   Prediabetes On metformin 500 mg once daily since November. - Continue metformin 500 mg once daily.      Orders Placed This Encounter  Procedures   FSH-Follicle stimulating hormone    Standing Status:   Future    Number of Occurrences:   1    Expiration Date:   11/20/2024   Estradiol , Sensitive    Standing Status:   Future    Number of Occurrences:   1    Expiration Date:   11/20/2024   The patient has a good understanding of the overall plan. she agrees with it. she will call with any problems that may develop before the next visit here. Total time spent: 30 mins including face to face time and time spent for planning, charting and co-ordination of care   Viinay K  Lilliane Sposito, MD 11/21/23

## 2023-11-22 LAB — FOLLICLE STIMULATING HORMONE: FSH: 42.4 m[IU]/mL

## 2023-11-23 LAB — ESTRADIOL, ULTRA SENS: Estradiol, Sensitive: 92.6 pg/mL

## 2023-11-25 ENCOUNTER — Telehealth: Payer: Self-pay

## 2023-11-25 ENCOUNTER — Other Ambulatory Visit: Payer: Self-pay | Admitting: *Deleted

## 2023-11-25 DIAGNOSIS — M7989 Other specified soft tissue disorders: Secondary | ICD-10-CM

## 2023-11-25 LAB — GENECONNECT MOLECULAR SCREEN: Genetic Analysis Overall Interpretation: NEGATIVE

## 2023-11-25 NOTE — Telephone Encounter (Signed)
 Per md orders entered for Guardant Reveal and all supported documents faxed to [phone]. Faxed confirmation was received.

## 2023-12-04 NOTE — Assessment & Plan Note (Signed)
 11/13/2021:Mammogram detected right breast asymmetry with calcifications, mass detected at ultrasound at 10 o'clock position measuring 0.7 cm, axilla negative, biopsy: Grade 1 IDC with tubular features ER 100%, PR 95%, Ki-67 10%, HER2 negative    12/23/21: Rt Lumpectomy 0.8 cm, Grade 1, Margins Neg, 0/1 LN Neg, ER 100%, PR 95%, Ki-67 10%, HER2 negative   Plan: 1. Adjuvant radiation therapy 01/28/2022-02/24/2022 2. Adjuvant antiestrogen therapy with tamoxifen  started 02/23/2022   Tamoxifen  toxicities: Hot flashes: Moderate in severity wake her up at night.   Denies any jaw joint or muscle stiffness or achiness.  Denies any mood swings or irritability.   Breast cancer surveillance: Mammogram 11/08/2023: Benign Density Cat C   Return to clinic in 1 year for follow-up

## 2023-12-05 ENCOUNTER — Inpatient Hospital Stay: Attending: Hematology and Oncology | Admitting: Hematology and Oncology

## 2023-12-05 DIAGNOSIS — C50411 Malignant neoplasm of upper-outer quadrant of right female breast: Secondary | ICD-10-CM | POA: Diagnosis not present

## 2023-12-05 DIAGNOSIS — Z79810 Long term (current) use of selective estrogen receptor modulators (SERMs): Secondary | ICD-10-CM

## 2023-12-05 DIAGNOSIS — Z923 Personal history of irradiation: Secondary | ICD-10-CM

## 2023-12-05 DIAGNOSIS — Z170 Estrogen receptor positive status [ER+]: Secondary | ICD-10-CM | POA: Diagnosis not present

## 2023-12-05 NOTE — Progress Notes (Signed)
 HEMATOLOGY-ONCOLOGY TELEPHONE VISIT PROGRESS NOTE  I connected with our patient on 12/05/23 at  8:30 AM EDT by telephone and verified that I am speaking with the correct person using two identifiers.  I discussed the limitations, risks, security and privacy concerns of performing an evaluation and management service by telephone and the availability of in person appointments.  I also discussed with the patient that there may be a patient responsible charge related to this service. The patient expressed understanding and agreed to proceed.   History of Present Illness:  History of Present Illness 11/21/2023:[name] is a 48 year old female with breast cancer who presents with hot flashes related to tamoxifen  use.  She underwent blood work to see if she is in menopause to determine if she could switch from tamoxifen  to aromatase inhibitor therapy.  She contacted by telephone today to discuss the results of her FSH and estradiol  and to come up with a treatment plan.  She experiences persistent hot flashes as a side effect of her tamoxifen  therapy, which she takes at a dose of 20 mg daily. These hot flashes disrupt her sleep, waking her up at night. Recent blood work shows an estradiol  level of 92, and her FSH levels are increasing into the menopause range, which is pertinent to her treatment and symptom management.    Oncology History  Malignant neoplasm of upper-outer quadrant of right breast in female, estrogen receptor positive (HCC)  11/13/2021 Initial Diagnosis   Mammogram detected right breast asymmetry with calcifications, mass detected at ultrasound at 10 o'clock position measuring 0.7 cm, axilla negative, biopsy: Grade 1 IDC with tubular features ER 100%, PR 95%, Ki-67 10%, HER2 negative   12/02/2021 Cancer Staging   Staging form: Breast, AJCC 8th Edition - Clinical: Stage IA (cT1b, cN0, cM0, G1, ER+, PR+, HER2-) - Signed by Odean Potts, MD on 12/02/2021 Stage prefix: Initial  diagnosis Histologic grading system: 3 grade system    Genetic Testing   [facility] CancerNext-Expanded Panel was Positive.  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 This result is consistent with a diagnosis of moderately-increased lifetime colorectal cancer risk, NOT classic familial adenomatous polyposis (FAP) or attenuated FAP (AFAP). Report date is 12/14/2021.  The CancerNext-Expanded gene panel offered by [facility] and includes sequencing, rearrangement, and RNA analysis for the following 77 genes: AIP, ALK, APC, ATM, AXIN2, BAP1, BARD1, BLM, BMPR1A, BRCA1, BRCA2, BRIP1, CDC73, CDH1, CDK4, CDKN1B, CDKN2A, CHEK2, CTNNA1, DICER1, FANCC, FH, FLCN, GALNT12, KIF1B, LZTR1, MAX, MEN1, MET, MLH1, MSH2, MSH3, MSH6, MUTYH, NBN, NF1, NF2, NTHL1, PALB2, PHOX2B, PMS2, POT1, PRKAR1A, PTCH1, PTEN, RAD51C, RAD51D, RB1, RECQL, RET, SDHA, SDHAF2, SDHB, SDHC, SDHD, SMAD4, SMARCA4, SMARCB1, SMARCE1, STK11, SUFU, TMEM127, TP53, TSC1, TSC2, VHL and XRCC2 (sequencing and deletion/duplication); EGFR, EGLN1, HOXB13, KIT, MITF, PDGFRA, POLD1, and POLE (sequencing only); EPCAM and GREM1 (deletion/duplication only).    12/23/2021 Surgery   BREAST, RIGHT, LUMPECTOMY, Pathologic Stage Classification (pTNM, AJCC 8th Edition): pT1b, pN0   12/23/2021 Surgery   Rt Lumpectomy 0.8 cm, Grade 1, Margins Neg, 0/1 LN Neg, ER 100%, PR 95%, Ki-67 10%, HER2 negative   01/27/2022 - 02/24/2022 Radiation Therapy   Site Technique Total Dose (Gy) Dose per Fx (Gy) Completed Fx Beam Energies  Breast, Right: Breast_R 3D 42.56/42.56 2.66 16/16 6XFFF  Breast, Right: Breast_R_Bst 3D 8/8 2 4/4 6X, 10X     02/2022 -  Anti-estrogen oral therapy   20 mg Tamoxifen  x 5-10 years  REVIEW OF SYSTEMS:   Constitutional: Denies fevers, chills or abnormal weight loss All other systems were reviewed with the patient and are negative. Observations/Objective:     Assessment Plan:  Malignant  neoplasm of upper-outer quadrant of right breast in female, estrogen receptor positive (HCC) 11/13/2021:Mammogram detected right breast asymmetry with calcifications, mass detected at ultrasound at 10 o'clock position measuring 0.7 cm, axilla negative, biopsy: Grade 1 IDC with tubular features ER 100%, PR 95%, Ki-67 10%, HER2 negative    12/23/21: Rt Lumpectomy 0.8 cm, Grade 1, Margins Neg, 0/1 LN Neg, ER 100%, PR 95%, Ki-67 10%, HER2 negative   Plan: 1. Adjuvant radiation therapy 01/28/2022-02/24/2022 2. Adjuvant antiestrogen therapy with tamoxifen  started 02/23/2022   Tamoxifen  toxicities: Hot flashes: Moderate in severity wake her up at night.   Denies any jaw joint or muscle stiffness or achiness.  Denies any mood swings or irritability.  11/20/2023: Estradiol  92.6, FSH 42.4 (FSH is in the menopausal range.  Estradiol  is not in menopausal range.) Therefore we recommend cutting the dose of tamoxifen  in half to 10 mg a day If her symptoms still do not get better we will have to consider medications for the hot flashes.  Breast cancer surveillance: Mammogram 11/08/2023: Benign Density Cat C   Return to clinic in 1 year for follow-up She will call us  sooner if the hot flashes are not tolerable. --------------------------------- Assessment and Plan Assessment & Plan Hot flashes due to tamoxifen  Persistent hot flashes likely due to slow metabolism of tamoxifen . Estradiol  level at 92 indicates premenopausal status, though FSH is increasing towards menopausal range. Evidence supports efficacy of lower tamoxifen  doses. - Reduce tamoxifen  dose to 10 mg daily using a pill cutter as tablets lack a groove. - Monitor for persistence of hot flashes and consider additional treatments if necessary. - Instructed to contact if symptoms persist or worsen.      I discussed the assessment and treatment plan with the patient. The patient was provided an opportunity to ask questions and all were answered.  The patient agreed with the plan and demonstrated an understanding of the instructions. The patient was advised to call back or seek an in-person evaluation if the symptoms worsen or if the condition fails to improve as anticipated.   I provided 20 minutes of non-face-to-face time during this encounter.  This includes time for charting and coordination of care   Naomi MARLA Chad, MD

## 2023-12-07 ENCOUNTER — Encounter (HOSPITAL_COMMUNITY)

## 2023-12-07 ENCOUNTER — Encounter

## 2023-12-16 ENCOUNTER — Telehealth: Payer: Self-pay | Admitting: *Deleted

## 2023-12-16 DIAGNOSIS — C50411 Malignant neoplasm of upper-outer quadrant of right female breast: Secondary | ICD-10-CM | POA: Diagnosis not present

## 2023-12-16 NOTE — Telephone Encounter (Signed)
 Per MD request RN placed call to pt with recent Guardant Reveal results being negative.  Pt educated and verbalized understanding.

## 2023-12-19 ENCOUNTER — Encounter: Payer: Self-pay | Admitting: Hematology and Oncology

## 2023-12-27 DIAGNOSIS — R7303 Prediabetes: Secondary | ICD-10-CM | POA: Diagnosis not present

## 2024-03-04 DIAGNOSIS — Z23 Encounter for immunization: Secondary | ICD-10-CM | POA: Diagnosis not present

## 2024-03-25 ENCOUNTER — Encounter: Payer: Self-pay | Admitting: Hematology and Oncology

## 2024-03-25 ENCOUNTER — Other Ambulatory Visit: Payer: Self-pay | Admitting: Hematology and Oncology

## 2024-03-26 ENCOUNTER — Ambulatory Visit: Attending: Surgery

## 2024-03-26 VITALS — Wt 131.2 lb

## 2024-03-26 DIAGNOSIS — Z483 Aftercare following surgery for neoplasm: Secondary | ICD-10-CM | POA: Insufficient documentation

## 2024-03-26 NOTE — Therapy (Signed)
 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 Freddie consulting w/Dr. Tomlin for this   Leukopenia 09/29/2011   Parotid mass 09/28/2011   S/P excision benign mass R parotid 12/09   Personal history of radiation therapy 02/01/2022   Past Surgical History:  Procedure Laterality Date   BREAST BIOPSY Right 11/13/2021   BREAST BIOPSY Right 11/04/2022   MM RT BREAST BX W LOC DEV 1ST LESION IMAGE BX SPEC STEREO GUIDE 11/04/2022 [facility]   BREAST LUMPECTOMY Right 12/23/2021   BREAST LUMPECTOMY WITH RADIOACTIVE SEED AND SENTINEL LYMPH NODE BIOPSY Right 12/23/2021   Procedure: RADIOACTIVE SEED GUIDED RIGHT BREAST LUMPECTOMY AND SENTINEL NODE BIOPSY;  Surgeon: Vernetta Berg, MD;  Location: [facility] OR;  Service: General;  Laterality: Right;   CESAREAN SECTION  05/31/2006   CYST EXCISION  2020   R axillary   FEMUR SURGERY Right 2009   Fibula bone grafted to R femur   FINGER FRACTURE SURGERY Right 1992   Ring finger   LAPAROSCOPIC ASSISTED VAGINAL HYSTERECTOMY N/A 10/25/2012   Procedure: LAPAROSCOPIC ASSISTED VAGINAL HYSTERECTOMY;  Surgeon: Lynwood FORBES Curlene PONCE, MD;  Location: [facility];  Service: Gynecology;  Laterality:  N/A;  with aspiration of left ovarian cyst   mammopexy Bilateral    MASS EXCISION Left 04/2008   R parotid   TUBAL LIGATION     Feb 2012   Patient Active Problem List   Diagnosis Date Noted   Basal cell carcinoma of scalp 05/18/2022   Gene mutation 12/21/2021   Genetic testing 12/11/2021   Family history of colon cancer in mother 12/02/2021   Malignant neoplasm of upper-outer quadrant of right breast in female, estrogen receptor positive (HCC) 11/26/2021   History of laparoscopic-assisted vaginal hysterectomy 10/22/2019   Leukopenia 09/29/2011   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 She underwent a right lumpectomy and sentinel node biopsy (1 negative node) on 12/23/2021. It is ER/PR positive and HER2 negative with a Ki67 of 10%.   PRECAUTIONS: right UE Lymphedema risk, None  SUBJECTIVE: Pt returns for her first 6 month L-Dex screen.  PAIN:  Are you having pain? No  SOZO SCREENING: Patient was assessed today using the SOZO machine to determine the lymphedema index score. This was compared to her baseline score. It was determined that she is  within the recommended range when compared to her baseline and no further action is needed at this time. She will continue SOZO screenings. These are done every 3 months for 2 years post operatively followed by every 6 months for 2 years, and then annually.   L-DEX FLOWSHEETS - 03/26/24 0800       L-DEX LYMPHEDEMA SCREENING   Measurement Type Unilateral    L-DEX MEASUREMENT EXTREMITY Upper Extremity    POSITION  Standing    DOMINANT SIDE Right    At Risk Side Right    BASELINE  SCORE (UNILATERAL) 0.4    L-DEX SCORE (UNILATERAL) 2.5    VALUE CHANGE (UNILAT) 2.1         P: Cont 6 month L-Dex screens until 11/2025.    Aden Berwyn Caldron, PTA 03/26/2024, 8:04 AM

## 2024-04-16 DIAGNOSIS — L720 Epidermal cyst: Secondary | ICD-10-CM | POA: Diagnosis not present

## 2024-04-16 DIAGNOSIS — L570 Actinic keratosis: Secondary | ICD-10-CM | POA: Diagnosis not present

## 2024-04-16 DIAGNOSIS — L700 Acne vulgaris: Secondary | ICD-10-CM | POA: Diagnosis not present

## 2024-04-16 DIAGNOSIS — L578 Other skin changes due to chronic exposure to nonionizing radiation: Secondary | ICD-10-CM | POA: Diagnosis not present

## 2024-04-16 DIAGNOSIS — D225 Melanocytic nevi of trunk: Secondary | ICD-10-CM | POA: Diagnosis not present

## 2024-04-19 DIAGNOSIS — Z23 Encounter for immunization: Secondary | ICD-10-CM | POA: Diagnosis not present

## 2024-04-19 DIAGNOSIS — L709 Acne, unspecified: Secondary | ICD-10-CM | POA: Diagnosis not present

## 2024-04-19 DIAGNOSIS — Z136 Encounter for screening for cardiovascular disorders: Secondary | ICD-10-CM | POA: Diagnosis not present

## 2024-04-19 DIAGNOSIS — C50911 Malignant neoplasm of unspecified site of right female breast: Secondary | ICD-10-CM | POA: Diagnosis not present

## 2024-04-19 DIAGNOSIS — Z862 Personal history of diseases of the blood and blood-forming organs and certain disorders involving the immune mechanism: Secondary | ICD-10-CM | POA: Diagnosis not present

## 2024-04-19 DIAGNOSIS — Z0000 Encounter for general adult medical examination without abnormal findings: Secondary | ICD-10-CM | POA: Diagnosis not present

## 2024-04-19 DIAGNOSIS — R7303 Prediabetes: Secondary | ICD-10-CM | POA: Diagnosis not present

## 2024-06-06 ENCOUNTER — Encounter: Payer: Self-pay | Admitting: Hematology and Oncology

## 2024-09-24 ENCOUNTER — Ambulatory Visit: Attending: Surgery

## 2024-11-20 ENCOUNTER — Ambulatory Visit: Admitting: Hematology and Oncology
